# Patient Record
Sex: Male | Born: 1980 | Race: Black or African American | Hispanic: No | Marital: Married | Smoking: Never smoker
Health system: Southern US, Community
[De-identification: ages and names within clinical notes are randomized; demographics above are authoritative.]

## PROBLEM LIST (undated history)

## (undated) ENCOUNTER — Ambulatory Visit: Admission: EM | Payer: Self-pay

## (undated) DIAGNOSIS — E669 Obesity, unspecified: Secondary | ICD-10-CM

## (undated) DIAGNOSIS — T7840XA Allergy, unspecified, initial encounter: Secondary | ICD-10-CM

## (undated) DIAGNOSIS — I10 Essential (primary) hypertension: Secondary | ICD-10-CM

## (undated) DIAGNOSIS — E119 Type 2 diabetes mellitus without complications: Secondary | ICD-10-CM

## (undated) DIAGNOSIS — N451 Epididymitis: Secondary | ICD-10-CM

## (undated) DIAGNOSIS — R7989 Other specified abnormal findings of blood chemistry: Secondary | ICD-10-CM

## (undated) DIAGNOSIS — R945 Abnormal results of liver function studies: Secondary | ICD-10-CM

## (undated) HISTORY — DX: Obesity, unspecified: E66.9

## (undated) HISTORY — DX: Abnormal results of liver function studies: R94.5

## (undated) HISTORY — DX: Other specified abnormal findings of blood chemistry: R79.89

## (undated) HISTORY — DX: Epididymitis: N45.1

## (undated) HISTORY — DX: Allergy, unspecified, initial encounter: T78.40XA

## (undated) HISTORY — DX: Essential (primary) hypertension: I10

## (undated) HISTORY — DX: Type 2 diabetes mellitus without complications: E11.9

---
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
## 2001-02-19 HISTORY — PX: OTHER SURGICAL HISTORY: SHX169

## 2001-03-06 ENCOUNTER — Ambulatory Visit (HOSPITAL_COMMUNITY): Admission: RE | Admit: 2001-03-06 | Discharge: 2001-03-06 | Payer: Self-pay | Admitting: Internal Medicine

## 2001-03-06 ENCOUNTER — Encounter: Payer: Self-pay | Admitting: Internal Medicine

## 2001-03-14 ENCOUNTER — Ambulatory Visit (HOSPITAL_COMMUNITY): Admission: RE | Admit: 2001-03-14 | Discharge: 2001-03-14 | Payer: Self-pay | Admitting: Urology

## 2001-03-14 ENCOUNTER — Encounter: Payer: Self-pay | Admitting: Internal Medicine

## 2001-03-14 ENCOUNTER — Ambulatory Visit (HOSPITAL_COMMUNITY): Admission: RE | Admit: 2001-03-14 | Discharge: 2001-03-14 | Payer: Self-pay | Admitting: Internal Medicine

## 2001-03-14 ENCOUNTER — Encounter: Payer: Self-pay | Admitting: Urology

## 2001-03-18 ENCOUNTER — Ambulatory Visit (HOSPITAL_COMMUNITY): Admission: RE | Admit: 2001-03-18 | Discharge: 2001-03-18 | Payer: Self-pay | Admitting: Urology

## 2001-03-18 ENCOUNTER — Encounter: Payer: Self-pay | Admitting: Urology

## 2001-03-19 ENCOUNTER — Encounter: Payer: Self-pay | Admitting: Urology

## 2001-03-20 ENCOUNTER — Ambulatory Visit (HOSPITAL_COMMUNITY): Admission: RE | Admit: 2001-03-20 | Discharge: 2001-03-20 | Payer: Self-pay | Admitting: Urology

## 2001-03-20 ENCOUNTER — Encounter: Payer: Self-pay | Admitting: Urology

## 2001-03-31 ENCOUNTER — Ambulatory Visit (HOSPITAL_COMMUNITY): Admission: RE | Admit: 2001-03-31 | Discharge: 2001-03-31 | Payer: Self-pay | Admitting: Urology

## 2001-05-07 ENCOUNTER — Encounter: Payer: Self-pay | Admitting: Urology

## 2001-05-07 ENCOUNTER — Ambulatory Visit (HOSPITAL_COMMUNITY): Admission: RE | Admit: 2001-05-07 | Discharge: 2001-05-07 | Payer: Self-pay | Admitting: Urology

## 2002-01-16 ENCOUNTER — Emergency Department (HOSPITAL_COMMUNITY): Admission: EM | Admit: 2002-01-16 | Discharge: 2002-01-16 | Payer: Self-pay | Admitting: Emergency Medicine

## 2002-01-16 ENCOUNTER — Encounter: Payer: Self-pay | Admitting: Emergency Medicine

## 2002-04-03 ENCOUNTER — Encounter: Payer: Self-pay | Admitting: Internal Medicine

## 2002-04-03 ENCOUNTER — Ambulatory Visit (HOSPITAL_COMMUNITY): Admission: RE | Admit: 2002-04-03 | Discharge: 2002-04-03 | Payer: Self-pay | Admitting: Internal Medicine

## 2002-04-06 ENCOUNTER — Emergency Department (HOSPITAL_COMMUNITY): Admission: EM | Admit: 2002-04-06 | Discharge: 2002-04-07 | Payer: Self-pay | Admitting: *Deleted

## 2002-04-14 ENCOUNTER — Encounter (HOSPITAL_COMMUNITY): Admission: RE | Admit: 2002-04-14 | Discharge: 2002-05-14 | Payer: Self-pay | Admitting: Internal Medicine

## 2004-04-08 ENCOUNTER — Emergency Department (HOSPITAL_COMMUNITY): Admission: EM | Admit: 2004-04-08 | Discharge: 2004-04-08 | Payer: Self-pay | Admitting: Emergency Medicine

## 2004-04-10 ENCOUNTER — Ambulatory Visit: Payer: Self-pay | Admitting: Family Medicine

## 2004-05-15 ENCOUNTER — Ambulatory Visit: Payer: Self-pay | Admitting: Family Medicine

## 2004-05-23 ENCOUNTER — Ambulatory Visit (HOSPITAL_COMMUNITY): Admission: RE | Admit: 2004-05-23 | Discharge: 2004-05-23 | Payer: Self-pay | Admitting: Family Medicine

## 2004-07-03 ENCOUNTER — Ambulatory Visit: Payer: Self-pay | Admitting: Family Medicine

## 2004-07-13 ENCOUNTER — Ambulatory Visit: Payer: Self-pay | Admitting: Orthopedic Surgery

## 2004-07-24 ENCOUNTER — Ambulatory Visit (HOSPITAL_COMMUNITY): Admission: RE | Admit: 2004-07-24 | Discharge: 2004-07-24 | Payer: Self-pay | Admitting: Orthopedic Surgery

## 2004-08-02 ENCOUNTER — Ambulatory Visit: Payer: Self-pay | Admitting: Orthopedic Surgery

## 2004-12-19 ENCOUNTER — Ambulatory Visit: Payer: Self-pay | Admitting: Family Medicine

## 2004-12-25 ENCOUNTER — Ambulatory Visit: Payer: Self-pay | Admitting: Family Medicine

## 2007-02-25 ENCOUNTER — Ambulatory Visit: Payer: Self-pay | Admitting: Family Medicine

## 2007-03-11 ENCOUNTER — Encounter: Payer: Self-pay | Admitting: Family Medicine

## 2007-03-11 LAB — CONVERTED CEMR LAB
BUN: 14 mg/dL (ref 6–23)
Basophils Absolute: 0 10*3/uL (ref 0.0–0.1)
Basophils Relative: 1 % (ref 0–1)
Calcium: 9.7 mg/dL (ref 8.4–10.5)
Chloride: 98 meq/L (ref 96–112)
Cholesterol: 222 mg/dL — ABNORMAL HIGH (ref 0–200)
Creatinine, Ser: 1.14 mg/dL (ref 0.40–1.50)
HCT: 51.2 % (ref 39.0–52.0)
HDL: 56 mg/dL (ref >39)
Lymphocytes Relative: 52 % — ABNORMAL HIGH (ref 12–46)
Monocytes Absolute: 0.5 10*3/uL (ref 0.1–1.0)
Neutro Abs: 1.8 10*3/uL (ref 1.7–7.7)
Neutrophils Relative %: 36 % — ABNORMAL LOW (ref 43–77)
Platelets: 277 10*3/uL (ref 150–400)
RDW: 14 % (ref 11.5–15.5)
Sodium: 140 meq/L (ref 135–145)
Total CHOL/HDL Ratio: 4
Triglycerides: 146 mg/dL (ref <150)

## 2007-06-03 ENCOUNTER — Ambulatory Visit: Payer: Self-pay | Admitting: Family Medicine

## 2007-06-13 DIAGNOSIS — I10 Essential (primary) hypertension: Secondary | ICD-10-CM

## 2007-06-13 DIAGNOSIS — E785 Hyperlipidemia, unspecified: Secondary | ICD-10-CM

## 2007-06-13 DIAGNOSIS — E1159 Type 2 diabetes mellitus with other circulatory complications: Secondary | ICD-10-CM | POA: Insufficient documentation

## 2007-06-13 DIAGNOSIS — E1169 Type 2 diabetes mellitus with other specified complication: Secondary | ICD-10-CM | POA: Insufficient documentation

## 2007-06-13 DIAGNOSIS — E669 Obesity, unspecified: Secondary | ICD-10-CM

## 2007-06-13 DIAGNOSIS — I152 Hypertension secondary to endocrine disorders: Secondary | ICD-10-CM | POA: Insufficient documentation

## 2007-10-30 ENCOUNTER — Telehealth: Payer: Self-pay | Admitting: Family Medicine

## 2008-02-24 ENCOUNTER — Ambulatory Visit: Payer: Self-pay | Admitting: Family Medicine

## 2008-02-24 DIAGNOSIS — J029 Acute pharyngitis, unspecified: Secondary | ICD-10-CM

## 2008-04-13 ENCOUNTER — Ambulatory Visit: Payer: Self-pay | Admitting: Family Medicine

## 2008-04-19 ENCOUNTER — Telehealth: Payer: Self-pay | Admitting: Family Medicine

## 2008-04-24 DIAGNOSIS — B369 Superficial mycosis, unspecified: Secondary | ICD-10-CM | POA: Insufficient documentation

## 2008-05-06 ENCOUNTER — Encounter: Payer: Self-pay | Admitting: Family Medicine

## 2008-05-07 LAB — CONVERTED CEMR LAB
Calcium: 9.8 mg/dL (ref 8.4–10.5)
Chloride: 102 meq/L (ref 96–112)
Creatinine, Ser: 1.08 mg/dL (ref 0.40–1.50)
Glucose, Bld: 87 mg/dL (ref 70–99)
HCT: 47.1 % (ref 39.0–52.0)
HDL: 50 mg/dL (ref >39)
Hemoglobin: 15.5 g/dL (ref 13.0–17.0)
Lymphocytes Relative: 40 % (ref 12–46)
Lymphs Abs: 2 10*3/uL (ref 0.7–4.0)
MCV: 84 fL (ref 78.0–100.0)
Monocytes Absolute: 0.5 10*3/uL (ref 0.1–1.0)
Neutro Abs: 2.4 10*3/uL (ref 1.7–7.7)
Platelets: 315 10*3/uL (ref 150–400)
Potassium: 4.4 meq/L (ref 3.5–5.3)
Sodium: 139 meq/L (ref 135–145)

## 2008-05-27 ENCOUNTER — Telehealth: Payer: Self-pay | Admitting: Family Medicine

## 2008-05-28 ENCOUNTER — Encounter: Payer: Self-pay | Admitting: Family Medicine

## 2008-06-09 ENCOUNTER — Telehealth: Payer: Self-pay | Admitting: Family Medicine

## 2008-06-30 ENCOUNTER — Telehealth: Payer: Self-pay | Admitting: Family Medicine

## 2008-08-18 ENCOUNTER — Telehealth: Payer: Self-pay | Admitting: Family Medicine

## 2008-10-13 ENCOUNTER — Telehealth: Payer: Self-pay | Admitting: Family Medicine

## 2008-11-17 ENCOUNTER — Telehealth: Payer: Self-pay | Admitting: Family Medicine

## 2008-11-29 ENCOUNTER — Ambulatory Visit: Payer: Self-pay | Admitting: Family Medicine

## 2008-11-29 LAB — CONVERTED CEMR LAB
BUN: 10 mg/dL (ref 6–23)
Calcium: 9.8 mg/dL (ref 8.4–10.5)
Chloride: 101 meq/L (ref 96–112)
Glucose, Bld: 83 mg/dL (ref 70–99)
HDL: 57 mg/dL (ref >39)
Potassium: 4.1 meq/L (ref 3.5–5.3)
Sodium: 140 meq/L (ref 135–145)
TSH: 3.831 microintl units/mL (ref 0.350–4.500)
VLDL: 18 mg/dL (ref 0–40)

## 2008-11-30 DIAGNOSIS — H6120 Impacted cerumen, unspecified ear: Secondary | ICD-10-CM | POA: Insufficient documentation

## 2008-11-30 DIAGNOSIS — L049 Acute lymphadenitis, unspecified: Secondary | ICD-10-CM | POA: Insufficient documentation

## 2008-11-30 DIAGNOSIS — E663 Overweight: Secondary | ICD-10-CM | POA: Insufficient documentation

## 2008-12-08 ENCOUNTER — Telehealth: Payer: Self-pay | Admitting: Family Medicine

## 2009-01-10 ENCOUNTER — Telehealth: Payer: Self-pay | Admitting: Family Medicine

## 2009-01-31 ENCOUNTER — Telehealth: Payer: Self-pay | Admitting: Family Medicine

## 2009-03-03 ENCOUNTER — Encounter: Payer: Self-pay | Admitting: Family Medicine

## 2009-05-25 ENCOUNTER — Telehealth: Payer: Self-pay | Admitting: Family Medicine

## 2009-06-06 ENCOUNTER — Ambulatory Visit (HOSPITAL_COMMUNITY): Admission: RE | Admit: 2009-06-06 | Discharge: 2009-06-06 | Payer: Self-pay | Admitting: Family Medicine

## 2009-06-06 ENCOUNTER — Ambulatory Visit: Payer: Self-pay | Admitting: Family Medicine

## 2009-06-06 DIAGNOSIS — M79609 Pain in unspecified limb: Secondary | ICD-10-CM

## 2009-08-09 ENCOUNTER — Telehealth: Payer: Self-pay | Admitting: Family Medicine

## 2009-09-01 ENCOUNTER — Telehealth: Payer: Self-pay | Admitting: Family Medicine

## 2009-11-23 LAB — CONVERTED CEMR LAB
BUN: 16 mg/dL (ref 6–23)
Basophils Absolute: 0 10*3/uL (ref 0.0–0.1)
Basophils Relative: 1 % (ref 0–1)
CO2: 29 meq/L (ref 19–32)
Calcium: 10 mg/dL (ref 8.4–10.5)
Chloride: 101 meq/L (ref 96–112)
Creatinine, Ser: 1.13 mg/dL (ref 0.40–1.50)
HCT: 46.6 % (ref 39.0–52.0)
Hemoglobin: 15.8 g/dL (ref 13.0–17.0)
LDL Cholesterol: 100 mg/dL — ABNORMAL HIGH (ref 0–99)
Lymphs Abs: 2.4 10*3/uL (ref 0.7–4.0)
MCV: 83.7 fL (ref 78.0–100.0)
Monocytes Absolute: 0.4 10*3/uL (ref 0.1–1.0)
Monocytes Relative: 8 % (ref 3–12)
Neutrophils Relative %: 35 % — ABNORMAL LOW (ref 43–77)
Platelets: 283 10*3/uL (ref 150–400)
Triglycerides: 80 mg/dL (ref <150)
WBC: 4.4 10*3/uL (ref 4.0–10.5)

## 2009-12-01 ENCOUNTER — Ambulatory Visit: Payer: Self-pay | Admitting: Family Medicine

## 2009-12-02 ENCOUNTER — Encounter: Payer: Self-pay | Admitting: Family Medicine

## 2009-12-05 ENCOUNTER — Encounter: Payer: Self-pay | Admitting: Family Medicine

## 2009-12-07 ENCOUNTER — Telehealth: Payer: Self-pay | Admitting: Physician Assistant

## 2010-03-21 NOTE — Progress Notes (Signed)
Summary: speak with nurse  Phone Note Call from Patient   Summary of Call: would like to speak with nurse [phone] Initial call taken by: Rudene Anda,  August 09, 2009 4:49 PM  Follow-up for Phone Call        returned call, left message Follow-up by: Adella Hare LPN,  August 10, 2009 11:01 AM  Additional Follow-up for Phone Call Additional follow up Details #1::        called patient, left message Additional Follow-up by: Adella Hare LPN,  August 11, 2009 8:26 AM

## 2010-03-21 NOTE — Progress Notes (Signed)
  Phone Note Call from Patient   Summary of Call: just got into town and was wanting to know if he could get [facility] [place]. He stated that he had yellowish drainage and I told him he needed to be seen and he said he was already coming in later this month and wanted something called in. I told him I would check since he didn't want to be seen. 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 and just wanted to get something over the counter so brandi is going to tell him Follow-up by: Lind Guest,  May 25, 2009 2:05 PM   Appended Document:  pls advise robitussin or mucinex  Appended Document:  already advised mucinex per brandi

## 2010-03-21 NOTE — Progress Notes (Signed)
Summary: MEDICINE  Phone Note Call from Patient   Summary of Call: NEEDS SOMETHING FOR CONGESTION THAT WILL NOT EFFECT HIS BLOOD PRESSURE   CALL BACK AT [phone] [facility] Initial call taken by: Lind Guest,  December 07, 2009 3:08 PM  Follow-up for Phone Call        Can try Coricidan HBP.  This is over the counter. Follow-up by: Esperanza Sheets PA,  December 07, 2009 3:31 PM  Additional Follow-up for Phone Call Additional follow up Details #1::        Phone Call Completed Additional Follow-up by: Adella Hare LPN,  December 08, 2009 2:58 PM

## 2010-03-21 NOTE — Assessment & Plan Note (Signed)
Summary: office visit   Vital Signs:  Patient profile:   30 year old male Height:      65 inches Weight:      152.50 pounds BMI:     25.47 O2 Sat:      98 % on Room air Pulse rate:   65 / minute Pulse rhythm:   regular Resp:     16 per minute BP sitting:   126 / 90  (left arm)  Vitals Entered By: Adella Hare LPN (June 06, 2009 2:25 PM)  Nutrition Counseling: Patient's BMI is greater than 25 and therefore counseled on weight management options.  O2 Flow:  Room air CC: follow-up visit Is Patient Diabetic? No Pain Assessment Patient in pain? no        CC:  follow-up visit.  History of Present Illness: Reports  that he has been  doing well. The pt has a very active job, and has m,odified his diet with a 30 pound weight loss in the past 1 yr.  Denies recent fever or chills. Denies sinus pressure, nasal congestion , ear pain or sore throat. Denies chest congestion, or cough productive of sputum. Denies chest pain, palpitations, PND, orthopnea or leg swelling. Denies abdominal pain, nausea, vomitting, diarrhea or constipation. Denies change in bowel movements or bloody stool. Denies dysuria , frequency, incontinence or hesitancy. He c/o bilateral foot pain, feels that glass may be in one foot or some other forign body for some time and wants this checked Denies headaches, vertigo, seizures. Denies depression, anxiety or insomnia. Denies  rash, lesions, or itch.     Current Medications (verified): 1)  Maxzide-25 37.5-25 Mg Tabs (Triamterene-Hctz) .... One Tab By Mouth Qd  Allergies (verified): No Known Drug Allergies  Review of Systems      See HPI Eyes:  Denies blurring, discharge, eye pain, and red eye. MS:  Complains of low back pain; c/o low back spasm occasionally when getting out of the shower, he does lift , bend and stoop on the jop, duratyion 10 to 15 mins.  concerns about a FB in the foot, unsure which but experiences pain on the soles intermittently,  wants it checked . Endo:  Denies cold intolerance, excessive hunger, excessive thirst, excessive urination, heat intolerance, polyuria, and weight change. Heme:  Denies abnormal bruising and bleeding. Allergy:  Complains of seasonal allergies; resoponding to otc med ? allegra or zyrtec pt unsure which.  Physical Exam  General:  Well-developed,well-nourished,in no acute distress; alert,appropriate and cooperative throughout examination HEENT: No facial asymmetry,  EOMI, No sinus tenderness, TM's Clear, oropharynx  pink and moist.   Chest: Clear to auscultation bilaterally.  CVS: S1, S2, No murmurs, No S3.   Abd: Soft, Nontender.  MS: Adequate ROM spine, hips, shoulders and knees. Tender to palpation of soles of feet.No foreign body palpated Ext: No edema.   CNS: CN 2-12 intact, power tone and sensation normal throughout.   Skin: Intact, no visible lesions or rashes.  Psych: Good eye contact, normal affect.  Memory intact, not anxious or depressed appearing.    Impression & Recommendations:  Problem # 1:  FOOT PAIN, BILATERAL (ICD-729.5) Assessment Comment Only  Orders: Radiology other (Radiology Other)  Problem # 2:  OVERWEIGHT (ICD-278.02) Assessment: Improved  Ht: 65 (06/06/2009)   Wt: 152.50 (06/06/2009)   BMI: 25.47 (06/06/2009)  Problem # 3:  HYPERLIPIDEMIA (ICD-272.4) Assessment: Improved  Orders: T-Lipid Profile ([id number])    HDL:57 (11/29/2008), 50 (05/06/2008)  LDL:103 (11/29/2008), 118 (05/06/2008)  Chol:178 (  11/29/2008), 200 (05/06/2008)  Trig:89 (11/29/2008), 159 (05/06/2008)  Problem # 4:  HYPERTENSION (ICD-401.9) Assessment: Deteriorated  His updated medication list for this problem includes:    Maxzide-25 37.5-25 Mg Tabs (Triamterene-hctz) ..... One tab by mouth qd    Maxzide-25 37.5-25 Mg Tabs (Triamterene-hctz) .Marland Kitchen... Take 1 tablet by mouth once a day  Orders: T-Basic Metabolic Panel [phone])  BP today: 126/90 Prior BP: 110/80  (11/29/2008)  Labs Reviewed: K+: 4.1 (11/29/2008) Creat: : 0.98 (11/29/2008)   Chol: 178 (11/29/2008)   HDL: 57 (11/29/2008)   LDL: 103 (11/29/2008)   TG: 89 (11/29/2008)  Complete Medication List: 1)  Maxzide-25 37.5-25 Mg Tabs (Triamterene-hctz) .... One tab by mouth qd 2)  Maxzide-25 37.5-25 Mg Tabs (Triamterene-hctz) .... Take 1 tablet by mouth once a day  Other Orders: T-CBC w/Diff ([id number]) T-TSH [phone]) T-Syphilis Test (RPR) [phone]) T-HIV Antibody  (Reflex) [id number])  Patient Instructions: 1)  CPE in Oct 13 or after 2)  You have done exceptionally well with your weight, keep it up. 3)  Plscommit to at least 30 mins of exercise every day. 4)  Xrays of both feet today., eval pain.R/O Fb 5)  No med changes at this time 6)  BMP prior to visit, ICD-9: 7)  Lipid Panel prior to visit, ICD-9:  fasting Oct 12 or after 8)  TSH prior to visit, ICD-9: 9)  CBC w/ Diff prior to visit, ICD-9: 10)  RPR and HIV test Prescriptions: MAXZIDE-25 37.5-25 MG TABS (TRIAMTERENE-HCTZ) Take 1 tablet by mouth once a day  #90 x 2   Entered and Authorized by:   [name] MD   Signed by:   [name] MD on 06/06/2009   Method used:   Print then Give to Patient   RxID:   [id number]

## 2010-03-21 NOTE — Assessment & Plan Note (Signed)
Summary: office visit   Vital Signs:  Patient profile:   30 year old male Height:      65 inches Weight:      150.25 pounds BMI:     25.09 O2 Sat:      99 % Pulse rate:   109 / minute Pulse rhythm:   regular Resp:     16 per minute BP sitting:   110 / 82  (left arm) Cuff size:   regular  Vitals Entered By: Everitt Amber LPN (December 01, 2009 4:30 PM)  Nutrition Counseling: Patient's BMI is greater than 25 and therefore counseled on weight management options. CC: CPE    CC:  CPE .  History of Present Illness: Reports  that he has been  doing well. Denies recent fever or chills. Denies sinus pressure, nasal congestion , ear pain or sore throat. Denies chest congestion, or cough productive of sputum. Denies chest pain, palpitations, PND, orthopnea or leg swelling. Denies abdominal pain, nausea, vomitting, diarrhea or constipation. Denies change in bowel movements or bloody stool. Denies dysuria , frequency, incontinence or hesitancy. Denies  joint pain, swelling, or reduced mobility. Denies headaches, vertigo, seizures. Denies depression, anxiety or insomnia.  pt here to review lab dATA AS WELL AS FOR ROUTINE F/U. hE CONTINUES TO BE CAREFUL WITH HIS DIET, AND IS EXTREMELY ACTIVE ON THE JOB, HE HAS LOST A FEW MORE POUNDS.      Allergies (verified): No Known Drug Allergies  Review of Systems      See HPI General:  Denies fatigue, sleep disorder, and weakness. Eyes:  Denies blurring and discharge. Derm:  Complains of itching and rash; puritic rash on posterior right neck x 3 weks. Endo:  Denies cold intolerance, excessive thirst, excessive urination, and heat intolerance. Heme:  Denies abnormal bruising and bleeding. Allergy:  Complains of seasonal allergies; denies hives or rash and itching eyes.  Physical Exam  General:  Well-developed,well-nourished,in no acute distress; alert,appropriate and cooperative throughout examination HEENT: No facial asymmetry,  EOMI,  No sinus tenderness, TM's Clear, oropharynx  pink and moist.   Chest: Clear to auscultation bilaterally.  CVS: S1, S2, No murmurs, No S3.   Abd: Soft, Nontender.  MS: Adequate ROM spine, hips, shoulders and knees.  Ext: No edema.   CNS: CN 2-12 intact, power tone and sensation normal throughout.   Skin: Intact,fungal rash on posterior right neck  Psych: Good eye contact, normal affect.  Memory intact, not anxious or depressed appearing.    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 Apply twice daily as needed to rash  Problem # 3:  HYPERTENSION (ICD-401.9) Assessment: Improved  The following medications were removed from the medication list:    Maxzide-25 37.5-25 Mg Tabs (Triamterene-hctz) ..... One tab by mouth qd His updated medication list for this problem includes:    Maxzide-25 37.5-25 Mg Tabs (Triamterene-hctz) .Marland Kitchen... Take 1 tablet by mouth once a day  BP today: 110/82 Prior BP: 126/90 (06/06/2009)  Labs Reviewed: K+: 4.4 (11/23/2009) Creat: : 1.13 (11/23/2009)   Chol: 177 (11/23/2009)   HDL: 61 (11/23/2009)   LDL: 100 (11/23/2009)   TG: 80 (11/23/2009)  Complete Medication List: 1)  Maxzide-25 37.5-25 Mg Tabs (Triamterene-hctz) .... Take 1 tablet by mouth once a day 2)  Clotrimazole-betamethasone 1-0.05 % Crea (Clotrimazole-betamethasone) .... Apply twice daily as needed to rash  Other Orders: Influenza Vaccine NON MCR ([id number])  Patient Instructions: 1)  CPE in 5 months and 3 weeks. 2)  Your Blood pressure is great, and  labs are excerllent. 3)  Weight is healthy. 4)  Labs have improved. 5)  Keep active.Eat healthily 6)  New med for rash . use as directed 7)  Flu vaccine today Prescriptions: CLOTRIMAZOLE-BETAMETHASONE 1-0.05 % CREA (CLOTRIMAZOLE-BETAMETHASONE) apply twice daily as needed to rash  #45 gm x 1   Entered and Authorized by:   [name] MD   Signed by:   [name] MD on 12/01/2009   Method used:   Electronically to        [facility] [phone]* (retail)       [address]       [place], [place]  [address]       Ph: [phone]       Fax: [phone]   RxID:   [id number]     Immunizations Administered:  Influenza Vaccine:    Vaccine Type: Fluvax Non-MCR    Site: right deltoid    Mfr: novartis    Dose: 0.5 ml    Route: IM    Given by: Mauricia Area CMA    Exp. Date: 06/2010    Lot #: [id number]    VIS given: 09/13/09 version given December 01, 2009.

## 2010-03-21 NOTE — Letter (Signed)
Summary: med review sheet  med review sheet   Imported By: Rudene Anda 12/02/2009 15:41:49  _____________________________________________________________________  External Attachment:    Type:   Image     Comment:   External Document

## 2010-03-21 NOTE — Progress Notes (Signed)
Summary: nat bites  Phone Note Call from Patient   Summary of Call: wants something  called in for some nats that have bit him on his back    [facility] [address] [facility] [place] [facility] him [facility] at [phone] to let him know Initial call taken by: Lind Guest,  September 01, 2009 8:55 AM  Follow-up for Phone Call        advised him he would have to come in to see [facility] but he cannot come in because he has worked third shift and is in [place]. Follow-up by: Everitt Amber LPN,  September 01, 2009 10:01 AM  Additional Follow-up for Phone Call Additional follow up Details #1::        Per Dr. Lodema Hong, patient is to use triple antibiotic ointment and if areas become red or inflammed looking then he needs OV. He has tried that but he knows that he is allergic to the bites and he has inflammed areas from scratching.Has approx. 30-40 bites on his neck. States he will go to the 2 minute clinic at [facility]. I told him that he could do that because before anything prescription is called in the doc would have to see the areas Additional Follow-up by: Everitt Amber LPN,  September 01, 2009 10:07 AM

## 2010-03-21 NOTE — Miscellaneous (Signed)
Summary: refill  Clinical Lists Changes  Medications: Rx of MAXZIDE-25 37.5-25 MG TABS (TRIAMTERENE-HCTZ) one tab by mouth qd;  #90 Tablet x 3;  Signed;  Entered by: Everitt Amber;  Authorized by: [name] MD;  Method used: [facility] [address] [id number] [address], [place], [place]  [address], Ph: [phone] or [phone], Fax: [phone]    Prescriptions: MAXZIDE-25 37.5-25 MG TABS (TRIAMTERENE-HCTZ) one tab by mouth qd  #90 Tablet x 3   Entered by:   Everitt Amber   Authorized by:   [name] MD   Signed by:   Everitt Amber on 03/03/2009   Method used:   Electronically to        [facility] ([facility]       [address]       [place], [place]  [address]       Ph: [phone] or [phone]       Fax: [phone]   RxID:   [id number]

## 2010-03-21 NOTE — Letter (Signed)
Summary: med review shee  med review shee   Imported By: Rudene Anda 12/05/2009 16:19:56  _____________________________________________________________________  External Attachment:    Type:   Image     Comment:   External Document

## 2010-03-22 ENCOUNTER — Telehealth: Payer: Self-pay | Admitting: Family Medicine

## 2010-04-06 NOTE — Progress Notes (Signed)
Summary: back pain  Phone Note Call from Patient   Summary of Call: needs a rx for back pain. [phone] Initial call taken by: Rudene Anda,  March 22, 2010 4:37 PM  Follow-up for Phone Call        needs appt for this, please offer first available ov Follow-up by: Adella Hare LPN,  March 22, 2010 4:49 PM  Additional Follow-up for Phone Call Additional follow up Details #1::        next monday morning I hear I am wide open I will be here Additional Follow-up by: [name] MD,  March 24, 2010 12:27 PM    Additional Follow-up for Phone Call Additional follow up Details #2::    called patient, left message Follow-up by: Adella Hare LPN,  March 28, 2010 11:15 AM  Additional Follow-up for Phone Call Additional follow up Details #3:: Details for Additional Follow-up Action Taken: returned call, no answer Additional Follow-up by: Adella Hare LPN,  March 29, 2010 8:58 AM

## 2010-05-22 ENCOUNTER — Encounter: Payer: Self-pay | Admitting: Family Medicine

## 2010-05-23 ENCOUNTER — Encounter: Payer: Self-pay | Admitting: Family Medicine

## 2010-05-24 ENCOUNTER — Encounter: Payer: Self-pay | Admitting: Family Medicine

## 2010-06-28 ENCOUNTER — Telehealth: Payer: Self-pay | Admitting: Family Medicine

## 2010-06-28 ENCOUNTER — Other Ambulatory Visit: Payer: Self-pay | Admitting: Family Medicine

## 2010-06-28 NOTE — Telephone Encounter (Signed)
This has already been done.

## 2010-07-07 NOTE — Op Note (Signed)
[facility]  Patient:    Devon Macdonald, Devon Macdonald Visit Number: [id number] MRN: [id number]          Service Type: DSU Location: DAY Attending Physician:  Dennie Maizes Dictated by:   Dennie Maizes, M.D. Proc. Date: 03/20/01 Admit Date:  03/20/2001   CC:         Elliot Cousin, M.D.   Operative Report  PREOPERATIVE DIAGNOSIS:  Right renal colic, right distal ureteral calculus with obstruction.  POSTOPERATIVE DIAGNOSIS:  Right renal colic, right distal ureteral calculus with obstruction.  OPERATIVE PROCEDURE:  Cystoscopy, right retrograde pyelogram, ureteroscopy and right ureteral stent placement.  ANESTHESIA:  General.  SURGEON:  Dennie Maizes, M.D.  COMPLICATIONS:  None.  INDICATIONS FOR THE PROCEDURE:  This 30 year old male was evaluated for right renal colic.  X-rays revealed a 7 x 4-mm-size stone at the level of the distal ureter near the pelvic brim.  Patient was unable to pass the stone.  He is taken to the OR today for cystoscopy, right retrograde pyelogram, ureteroscopy, stone extraction and right ureteral stent placement.  DESCRIPTION OF THE PROCEDURE:  General anesthesia was induced and the patient was placed on the cystoscopy table in the dorsal lithotomy position.  The lower abdomen and genitalia were prepped and draped in a sterile fashion. Cystoscopy was done with the 25-French scope.  The urethra, prostate and bladder were normal.  The trigone, ureteral orifices and bladder mucosa were unremarkable.  A 5-French Burch catheter was then placed in the right ureteral orifice.  About 7 cc of Renografin 60 were injected into the collecting system and a retrograde pyelogram was done with C-arm fluoroscopy.  The distal ureter was normal.  There was a filling defect about 7 mm in size in the distal ureter at the level of the pelvic brim.  The ureter above the filling defect was found to be moderately dilated.  A 6-French open-ended  catheter was then placed in the right distal ureter.  A 0.038-inch Benson guidewire with a flexible tip was then advanced into the renal pelvis with some difficulty. The distal ureter was then dilated using 18-French balloon dilating catheter. The guidewire was left in the ureter and the balloon dilating catheter was removed.  A 9-French rigid ureteroscope was then inserted into the distal ureter.  There was an area of narrowing, fibrosis and edema just below the level of the stone.  It was not possible to pass the scope through this area. The stone was not visualized.  Stone extraction could not be done.  The ureteroscope was then removed.  Through a cystoscope, a 6-French 26-cm-size stent was then inserted into the right collecting system.  There was some difficulty in inserting the stent through the site of narrowing in the ureter.  The instruments were then removed.  The patient was transferred to the PACU in a satisfactory condition.  I plan to treat the stone with lithotripsy as an outpatient at a later date; this will be explained to the patient and his family. Dictated by:   Dennie Maizes, M.D. Attending Physician:  Dennie Maizes DD:  03/20/01 TD:  03/21/01 Job: [id number]

## 2010-07-07 NOTE — Op Note (Signed)
[facility]  Patient:    KALUP, JAQUITH Visit Number: [id number] MRN: [id number]          Service Type: DSU Location: DAY Attending Physician:  Dennie Maizes Dictated by:   Dennie Maizes, M.D. Proc. Date: 03/31/01 Admit Date:  03/31/2001   CC:         Elliot Cousin, M.D.   Operative Report  PREOPERATIVE DIAGNOSIS:  Right distal ureteral calculus, status post ureteral stent placement and lithotripsy.  POSTOPERATIVE DIAGNOSIS:  Right distal ureteral calculus, status post ureteral stent placement and lithotripsy.  OPERATIVE PROCEDURE:  Cystoscopy and removal of right ureteral stent.  ANESTHESIA:  General.  SURGEON:  Dennie Maizes, M.D.  COMPLICATIONS:  None.  INDICATIONS FOR THE PROCEDURE:  This 31 year old male had right renal colic. He had a 7 x 5-mm-size right distal ureteral calculus with obstruction. Cystoscopy, right retrograde pyelogram, right ureteral stent placement were done.  The stone has been treated with lithotripsy.  The patient is brought to the OR today for cystoscopy and removal of the right ureteral stent.  DESCRIPTION OF THE PROCEDURE:  General anesthesia was induced and the patient was placed on the OR table in the dorsal lithotomy position.  The genitalia were prepped and draped in a sterile fashion.  Cystoscopy was done with the 25-French scope.  The urethra was normal.  Bladder was unremarkable.  The lower end of the stent was then held with the grasping forceps and removed without any difficulty.  Patient was transferred to the PACU in a satisfactory condition. Dictated by:   Dennie Maizes, M.D. Attending Physician:  Dennie Maizes DD:  03/31/01 TD:  03/31/01 Job: [id number]

## 2010-07-07 NOTE — H&P (Signed)
[facility]  Patient:    Devon Macdonald, Devon Macdonald Visit Number: [id number] MRN: [id number]          Service Type: DSU Location: DAY Attending Physician:  Dennie Maizes Dictated by:   Dennie Maizes, M.D. Admit Date:  03/31/2001   CC:         Elliot Cousin, M.D.   History and Physical  CHIEF COMPLAINT:  Right distal ureteral calculus, right flank pain.  HISTORY OF PRESENT ILLNESS:  This 30 year old male was evaluated for right renal colic.  X-rays revealed small right renal calculi without obstruction. There is a 7 x 5 mm size right distal ureteral calculus to the lower left _______.  Cystoscopy and right retrograde pyelogram and ureteroscopy were done on March 20, 2001.  The patient had fibrosis, edema, and narrowing of the distal ureter just distal to the stone.  The stone could not be seen and retrieved.  An ureteral stent was placed at that time.  The patient has subsequently undergone ESWL of the right ureteral calculus.  Followup x-rays revealed that the stone had been fragmented well.  The patient is doing well at present.  He is brought to the day hospital for cystoscopy and removal of the right ureteral stent.  PAST MEDICAL HISTORY:  No medical illnesses.  Status post ORIF of right femoral fracture.  MEDICATIONS:  Bactrim and Percocet.  ALLERGIES:   No known drug allergies.  PHYSICAL EXAMINATION:  HEENT:  Normal.  NECK:  No masses.  LUNGS:  Clear to auscultation.  HEART:  Regular rate and rhythm with no murmur.  ABDOMEN:  Soft, no palpable flank mass.  No CVA tenderness.  Bladder not palpable.  GU:  Penis and testes are normal.  IMPRESSION:  Right distal ureteral calculus, status post right ureteral stent placement, status post lithotripsy.  PLAN:  Cystoscopy and removal of the right ureteral stent under anesthesia in day hospital.  I will discuss with the patient regarding the diagnosis, operative details, alternate  treatments, outcome, possible risks, and complications, and he has agreed for the procedure to be done. Dictated by:   Dennie Maizes, M.D. Attending Physician:  Dennie Maizes DD:  03/31/01 TD:  03/31/01 Job: [id number]

## 2010-07-24 ENCOUNTER — Encounter: Payer: Self-pay | Admitting: Family Medicine

## 2010-07-25 ENCOUNTER — Ambulatory Visit (INDEPENDENT_AMBULATORY_CARE_PROVIDER_SITE_OTHER): Payer: Managed Care, Other (non HMO) | Admitting: Family Medicine

## 2010-07-25 ENCOUNTER — Encounter: Payer: Self-pay | Admitting: Family Medicine

## 2010-07-25 DIAGNOSIS — I10 Essential (primary) hypertension: Secondary | ICD-10-CM

## 2010-07-25 DIAGNOSIS — B369 Superficial mycosis, unspecified: Secondary | ICD-10-CM

## 2010-07-25 DIAGNOSIS — E785 Hyperlipidemia, unspecified: Secondary | ICD-10-CM

## 2010-07-25 DIAGNOSIS — H6120 Impacted cerumen, unspecified ear: Secondary | ICD-10-CM

## 2010-07-25 MED ORDER — TRIAMTERENE-HCTZ 37.5-25 MG PO TABS: ORAL_TABLET | ORAL | Status: DC

## 2010-07-25 NOTE — Progress Notes (Signed)
Bilateral ear irrigation. Successfully removed from both sides with no signs of irritation. Rechecked by Dr. Lodema Hong

## 2010-07-25 NOTE — Patient Instructions (Signed)
F/u in 2 months.  Dose increase on maxzide to ONE and a half tablets once daily, bP today is 130/100  It is important that you exercise regularly at least 30 minutes 5 times a week. If you develop chest pain, have severe difficulty breathing, or feel very tired, stop exercising immediately and seek medical attention  A healthy diet is rich in fruit, vegetables and whole grains. Poultry fish, nuts and beans are a healthy choice for protein rather then red meat. A low sodium diet and drinking 64 ounces of water daily is generally recommended. Oils and sweet should be limited. Carbohydrates especially for those who are diabetic or overweight, should be limited to 34-45 gram per meal. It is important to eat on a regular schedule, at least 3 times daily. Snacks should be primarily fruits, vegetables or nuts.   Fasting lipid and chem 7 in 2 months, before next visit  Ear irrigation today

## 2010-08-02 NOTE — Assessment & Plan Note (Signed)
Mild flare affecting back med prescribed

## 2010-08-02 NOTE — Assessment & Plan Note (Signed)
Medication compliance addressed. Commitment to regular exercise and healthy  food choices, with portion control discussed. DASH diet and low fat diet discussed and literature offered. Changes in medication made at this visit.  

## 2010-08-02 NOTE — Assessment & Plan Note (Signed)
Ear irrigate succesfully, no complications

## 2010-08-02 NOTE — Progress Notes (Signed)
  Subjective:    Patient ID: Devon Macdonald, male    DOB: Sep 28, 1980, 30 y.o.   MRN: [id number]  HPI The PT is here for annual exam  and re-evaluation of chronic medical conditions, medication management and review of any  recent lab and radiology data.  Preventive health is updated, specifically   Immunization.   Questions or concerns regarding consultations or procedures which the PT has had in the interim are  addressed. The PT denies any adverse reactions to current medications since the last visit.  C/o left ear discomfort x 1 week. C/o rash and itch on back x 3 days      Review of Systems Denies recent fever or chills. Denies sinus pressure, nasal congestion,  or sore throat. Denies chest congestion, productive cough or wheezing. Denies chest pains, palpitations, paroxysmal nocturnal dyspnea, orthopnea and leg swelling Denies abdominal pain, nausea, vomiting,diarrhea or constipation.  Denies rectal bleeding or change in bowel movement. Denies dysuria, frequency, hesitancy or incontinence. Denies joint pain, swelling and limitation in mobility. Denies headaches, seizure, numbness, or tingling. Denies depression, anxiety or insomnia.        Objective:   Physical Exam Pleasant well nourished male, alert and oriented x 3, in no cardio-pulmonary distress. Afebrile. HEENT No facial trauma or asymetry.   EOMI, PERTL, fundoscopic exam is negative for hemorhages or exudates. External ears normal,left cerumen impaction  Oropharynx moist, no exudate, good dentition. Neck: supple, no adenopathy,JVD or thyromegaly.No bruits.  Chest: Clear to ascultation bilaterally.No crackles or wheezes. Non tender to palpation  Breast: No asymetry,no masses. No nipple discharge or inversion. No axillary or supraclavicular adenopathy  Cardiovascular system; Heart sounds normal,  S1 and  S2 ,no S3.  No murmur, or thrill. Apical beat not displaced Peripheral pulses  normal.  Abdomen: Soft, non tender, no organomegaly or masses. No bruits. Bowel sounds normal. No guarding, tenderness or rebound.   GU: No penile lesion or discharge. No testicular mass.  Musculoskeletal exam: Full ROM of spine, hips , shoulders and knees. No deformity ,swelling or crepitus noted. No muscle wasting or atrophy.   Neurologic: Cranial nerves 2 to 12 intact. Power, tone ,sensation and reflexes normal throughout. No disturbance in gait. No tremor.  Skin: Intact, tinea versicolor on back Pigmentation normal throughout  Psych; Normal mood and affect. Judgement and concentration normal         Assessment & Plan:

## 2010-10-05 LAB — BASIC METABOLIC PANEL
BUN: 14 mg/dL (ref 6–23)
CO2: 29 mEq/L (ref 19–32)
Chloride: 98 mEq/L (ref 96–112)
Creat: 1.08 mg/dL (ref 0.50–1.35)
Glucose, Bld: 79 mg/dL (ref 70–99)
Potassium: 4.7 mEq/L (ref 3.5–5.3)
Sodium: 138 mEq/L (ref 135–145)

## 2010-10-05 LAB — LIPID PANEL
LDL Cholesterol: 133 mg/dL — ABNORMAL HIGH (ref 0–99)
Triglycerides: 114 mg/dL (ref <150)
VLDL: 23 mg/dL (ref 0–40)

## 2010-10-05 NOTE — Progress Notes (Signed)
Patient aware.

## 2010-10-16 ENCOUNTER — Encounter: Payer: Self-pay | Admitting: Family Medicine

## 2010-10-17 ENCOUNTER — Ambulatory Visit (INDEPENDENT_AMBULATORY_CARE_PROVIDER_SITE_OTHER): Payer: Managed Care, Other (non HMO) | Admitting: Family Medicine

## 2010-10-17 ENCOUNTER — Encounter: Payer: Self-pay | Admitting: Family Medicine

## 2010-10-17 VITALS — BP 140/82 | HR 79 | Resp 16 | Ht 65.0 in | Wt 169.1 lb

## 2010-10-17 DIAGNOSIS — F439 Reaction to severe stress, unspecified: Secondary | ICD-10-CM

## 2010-10-17 DIAGNOSIS — F329 Major depressive disorder, single episode, unspecified: Secondary | ICD-10-CM

## 2010-10-17 DIAGNOSIS — E785 Hyperlipidemia, unspecified: Secondary | ICD-10-CM

## 2010-10-17 DIAGNOSIS — M549 Dorsalgia, unspecified: Secondary | ICD-10-CM | POA: Insufficient documentation

## 2010-10-17 DIAGNOSIS — I10 Essential (primary) hypertension: Secondary | ICD-10-CM

## 2010-10-17 DIAGNOSIS — Z733 Stress, not elsewhere classified: Secondary | ICD-10-CM

## 2010-10-17 MED ORDER — METHYLPREDNISOLONE ACETATE 80 MG/ML IJ SUSP
80.0000 mg | Freq: Once | INTRAMUSCULAR | Status: AC
  Administered 2010-10-17: 80 mg via INTRAMUSCULAR

## 2010-10-17 MED ORDER — CYCLOBENZAPRINE HCL 10 MG PO TABS: ORAL_TABLET | ORAL | Status: DC

## 2010-10-17 MED ORDER — PREDNISONE (PAK) 5 MG PO TABS: 5.0000 mg | ORAL_TABLET | ORAL | Status: DC

## 2010-10-17 MED ORDER — KETOROLAC TROMETHAMINE 60 MG/2ML IM SOLN
60.0000 mg | Freq: Once | INTRAMUSCULAR | Status: AC
  Administered 2010-10-17: 60 mg via INTRAMUSCULAR

## 2010-10-17 MED ORDER — IBUPROFEN 800 MG PO TABS: 800.0000 mg | ORAL_TABLET | Freq: Three times a day (TID) | ORAL | Status: DC | PRN

## 2010-10-17 NOTE — Assessment & Plan Note (Signed)
Several year h/o mid back pain with recurrent flares, anti-inflammatories administered and prescribed, x ray ordered also

## 2010-10-17 NOTE — Assessment & Plan Note (Signed)
Deteriorated, low fat diet discussed and encouraged 

## 2010-10-17 NOTE — Assessment & Plan Note (Signed)
Stressed and overwhelmed with life circumstances, depressed and tearful, not suicidal or homicidal, therapy suggested , pt will think about this

## 2010-10-17 NOTE — Progress Notes (Signed)
  Subjective:    Patient ID: Devon Macdonald, male    DOB: 10/18/1980, 30 y.o.   MRN: [id number]  HPI Pt in today, tearful, angry, stressed, depressed and overwhelmed with life circumstances. At 48 he feels responsible for his 68 yr old sister, her 3 children, his mother, since father is alcoholic , himself and his son. He states he has noo pleasure in life, no time for himself, and his health is deteriorating as a result. He has gained weight, his blood pressure is still elevated, he is not exercising, and he is stressed. He is not suicidal or homicidal, but wants someone to tell him how to "fix " his life. C/o several year h/o mid and low back pain, non radiating, remembers straining it playing with his nephew. Reports increased flares of painand spasm   Review of Systems See HPI Denies recent fever or chills. Denies sinus pressure, nasal congestion, ear pain or sore throat. Denies chest congestion, productive cough or wheezing. Denies chest pains, palpitations and leg swelling Denies abdominal pain, nausea, vomiting,diarrhea or constipation.   Denies dysuria, frequency, hesitancy or incontinence. . Denies skin break down or rash.        Objective:   Physical Exam Patient alert and oriented and in no cardiopulmonary distress.  HEENT: No facial asymmetry, EOMI, no sinus tenderness,  oropharynx pink and moist.  Neck supple no adenopathy.  Chest: Clear to auscultation bilaterally.  CVS: S1, S2 no murmurs, no S3.  ABD: Soft non tender. Bowel sounds normal.  Ext: No edema  MS: Adequate ROM spine, shoulders, hips and knees.  Skin: Intact, no ulcerations or rash noted.  Psych: Good eye contact, tearful , anxious , angry and depressed, not suicidal, homicidal or hallucinating  CNS: CN 2-12 intact, power, tone and sensation normal throughout.        Assessment & Plan:

## 2010-10-17 NOTE — Patient Instructions (Signed)
F/u in 6 weeks.  Pls start running or jogging every day with music , this will help you to deal with stress and you should enjoy this and it will also help your blood pressure.  I am not changing your medication dose .  Increase fruit and vegetables and decrease salt.  Pls try and lose 4 pounds, and find something to smile about every day.  pls consider therapy for yourself, and be more open about how you feel, which is actuallty coming from a really good and caring heart  Medication and injections for mid back pain which you have had for years and muscle relaxant only if you have severe spasm. X ray of back also

## 2010-10-17 NOTE — Assessment & Plan Note (Signed)
Uncontrolled , no med change at this time

## 2010-10-18 ENCOUNTER — Ambulatory Visit (HOSPITAL_COMMUNITY)
Admission: RE | Admit: 2010-10-18 | Discharge: 2010-10-18 | Disposition: A | Discharge status: Home / Self Care | Payer: Managed Care, Other (non HMO) | Source: Ambulatory Visit | Attending: Family Medicine | Admitting: Family Medicine

## 2010-10-18 ENCOUNTER — Other Ambulatory Visit: Payer: Self-pay

## 2010-10-18 DIAGNOSIS — M546 Pain in thoracic spine: Secondary | ICD-10-CM | POA: Insufficient documentation

## 2010-10-18 DIAGNOSIS — M545 Low back pain, unspecified: Secondary | ICD-10-CM | POA: Insufficient documentation

## 2010-10-18 DIAGNOSIS — M549 Dorsalgia, unspecified: Secondary | ICD-10-CM

## 2010-10-18 NOTE — Progress Notes (Signed)
Thoracolumbar was ordered and that is for scoliosis. Reordered per Dr Lodema Hong a thoracic

## 2010-10-24 ENCOUNTER — Telehealth: Payer: Self-pay | Admitting: *Deleted

## 2010-10-24 MED ORDER — PENICILLIN V POTASSIUM 500 MG PO TABS: 500.0000 mg | ORAL_TABLET | Freq: Three times a day (TID) | ORAL | Status: AC

## 2010-10-24 NOTE — Telephone Encounter (Signed)
pls advise and erx penicillin 500mg  one 3 times daily #30 for sinusitis

## 2010-10-24 NOTE — Telephone Encounter (Signed)
MED SENT, CALLED PATIENT, LEFT DETAILED VOICEMAIL

## 2010-11-30 ENCOUNTER — Encounter: Payer: Self-pay | Admitting: Family Medicine

## 2011-01-03 ENCOUNTER — Other Ambulatory Visit: Payer: Self-pay | Admitting: Family Medicine

## 2011-01-05 ENCOUNTER — Telehealth: Payer: Self-pay | Admitting: Family Medicine

## 2011-01-05 MED ORDER — TRIAMTERENE-HCTZ 37.5-25 MG PO TABS: ORAL_TABLET | ORAL | Status: DC

## 2011-01-05 NOTE — Telephone Encounter (Signed)
Sent in again for 90 day supply

## 2011-01-09 ENCOUNTER — Encounter: Payer: Self-pay | Admitting: Family Medicine

## 2011-01-09 ENCOUNTER — Telehealth: Payer: Self-pay | Admitting: Family Medicine

## 2011-01-09 ENCOUNTER — Other Ambulatory Visit: Payer: Self-pay | Admitting: Family Medicine

## 2011-01-09 MED ORDER — PROMETHAZINE-DM 6.25-15 MG/5ML PO SYRP: 5.0000 mL | ORAL_SOLUTION | Freq: Four times a day (QID) | ORAL | Status: AC | PRN

## 2011-01-09 NOTE — Telephone Encounter (Signed)
Has been coughing all day, has appt in the am but needs some cough medicine today because he has to work [employer]. Please send to [facility]

## 2011-01-09 NOTE — Telephone Encounter (Signed)
Medication has been sent in , message left that med has been sent in

## 2011-01-10 ENCOUNTER — Ambulatory Visit (INDEPENDENT_AMBULATORY_CARE_PROVIDER_SITE_OTHER): Payer: Managed Care, Other (non HMO) | Admitting: Family Medicine

## 2011-01-10 ENCOUNTER — Encounter: Payer: Self-pay | Admitting: Family Medicine

## 2011-01-10 ENCOUNTER — Other Ambulatory Visit: Payer: Self-pay | Admitting: Family Medicine

## 2011-01-10 ENCOUNTER — Ambulatory Visit (HOSPITAL_COMMUNITY)
Admission: RE | Admit: 2011-01-10 | Discharge: 2011-01-10 | Disposition: A | Discharge status: Home / Self Care | Payer: Managed Care, Other (non HMO) | Source: Ambulatory Visit | Attending: Family Medicine | Admitting: Family Medicine

## 2011-01-10 VITALS — BP 130/72 | HR 103 | Temp 99.0°F | Resp 18 | Ht 65.0 in | Wt 162.1 lb

## 2011-01-10 DIAGNOSIS — J209 Acute bronchitis, unspecified: Secondary | ICD-10-CM

## 2011-01-10 DIAGNOSIS — R509 Fever, unspecified: Secondary | ICD-10-CM | POA: Insufficient documentation

## 2011-01-10 DIAGNOSIS — F329 Major depressive disorder, single episode, unspecified: Secondary | ICD-10-CM

## 2011-01-10 DIAGNOSIS — R5383 Other fatigue: Secondary | ICD-10-CM

## 2011-01-10 DIAGNOSIS — R5381 Other malaise: Secondary | ICD-10-CM

## 2011-01-10 DIAGNOSIS — R059 Cough, unspecified: Secondary | ICD-10-CM | POA: Insufficient documentation

## 2011-01-10 DIAGNOSIS — R05 Cough: Secondary | ICD-10-CM | POA: Insufficient documentation

## 2011-01-10 DIAGNOSIS — J0100 Acute maxillary sinusitis, unspecified: Secondary | ICD-10-CM

## 2011-01-10 DIAGNOSIS — Z113 Encounter for screening for infections with a predominantly sexual mode of transmission: Secondary | ICD-10-CM

## 2011-01-10 DIAGNOSIS — I10 Essential (primary) hypertension: Secondary | ICD-10-CM

## 2011-01-10 LAB — CBC WITH DIFFERENTIAL/PLATELET
Eosinophils Absolute: 0.1 10*3/uL (ref 0.0–0.7)
HCT: 48.1 % (ref 39.0–52.0)
Hemoglobin: 16 g/dL (ref 13.0–17.0)
Lymphocytes Relative: 32 % (ref 12–46)
Lymphs Abs: 2.7 10*3/uL (ref 0.7–4.0)
MCHC: 33.3 g/dL (ref 30.0–36.0)
Monocytes Absolute: 0.8 10*3/uL (ref 0.1–1.0)
Monocytes Relative: 10 % (ref 3–12)
RBC: 5.54 MIL/uL (ref 4.22–5.81)
WBC: 8.4 10*3/uL (ref 4.0–10.5)

## 2011-01-10 LAB — BASIC METABOLIC PANEL
BUN: 16 mg/dL (ref 6–23)
Calcium: 10.2 mg/dL (ref 8.4–10.5)
Glucose, Bld: 83 mg/dL (ref 70–99)
Potassium: 4.6 mEq/L (ref 3.5–5.3)

## 2011-01-10 LAB — HIV ANTIBODY (ROUTINE TESTING W REFLEX): HIV: NONREACTIVE

## 2011-01-10 MED ORDER — BENZONATATE 100 MG PO CAPS
100.0000 mg | ORAL_CAPSULE | Freq: Four times a day (QID) | ORAL | Status: DC | PRN
Start: 2011-01-10 — End: 2011-04-04

## 2011-01-10 MED ORDER — CEFTRIAXONE SODIUM 500 MG IJ SOLR
500.0000 mg | Freq: Once | INTRAMUSCULAR | Status: AC
  Administered 2011-01-10: 500 mg via INTRAMUSCULAR

## 2011-01-10 MED ORDER — SULFAMETHOXAZOLE-TRIMETHOPRIM 800-160 MG PO TABS: 1.0000 | ORAL_TABLET | Freq: Two times a day (BID) | ORAL | Status: AC

## 2011-01-10 NOTE — Assessment & Plan Note (Signed)
Controlled, no change in medication  

## 2011-01-10 NOTE — Assessment & Plan Note (Signed)
Improved, and on no medication 

## 2011-01-10 NOTE — Progress Notes (Signed)
  Subjective:    Patient ID: Devon Macdonald, male    DOB: 1980/05/13, 30 y.o.   MRN: [id number]  HPI 1 week h/o right maxillary pressure  With green right nasal drainage , cough productive of green sputum, chills and fever for 1 week intermittent. Reports improvement in symptoms of depression, has been  On no medication, states he is currently in a good relationship, which he finds has helped a lot in this regard. Wants to be checked fo sTI   Review of Systems See HPI Denies abdominal pain, nausea, vomiting,diarrhea or constipation.   Denies dysuria, frequency, hesitancy or incontinence. Denies joint pain, swelling and limitation in mobility. Denies headaches, seizures, numbness, or tingling. Denies depression,stull has  Anxiety, has had  Insomnia in the past week due to excessive cough , started med yesterday which helped , but has made him very drowsy also Denies skin break down or rash.        Objective:   Physical Exam Patient alert and oriented and in no cardiopulmonary distress.Ill appearing  HEENT: No facial asymmetry, EOMI, maxillary  sinus tenderness,  oropharynx pink and moist.  Neck supple anterior  adenopathy.  Chest: decreased air entry in left lower lobe withj cracklesCVS: S1, S2 no murmurs, no S3.  ABD: Soft non tender. Bowel sounds normal.  Ext: No edema  MS: Adequate ROM spine, shoulders, hips and knees.  Skin: Intact, no ulcerations or rash noted.  Psych: Good eye contact, normal affect. Memory intact anxious but not depressed appearing.  CNS: CN 2-12 intact, power, tone and sensation normal throughout.       Assessment & Plan:

## 2011-01-10 NOTE — Patient Instructions (Signed)
F/u in 2 months, cancel sooner appointment if there is one please.  You are being treated for sinusitis and bronchitis.  Rocephin will be adminitered in the office today and antibiotics and decongestants are sent to your pharmachy. Fluids and rest please.

## 2011-01-10 NOTE — Assessment & Plan Note (Signed)
Acute symptoms, antibiotic prescribed , will get CXR poor air entry in right lower lobe

## 2011-01-14 NOTE — Assessment & Plan Note (Signed)
Antibiotic administered and prescribed 

## 2011-01-16 LAB — T4: T4, Total: 7.8 ug/dL (ref 5.0–12.5)

## 2011-01-24 ENCOUNTER — Telehealth: Payer: Self-pay | Admitting: Family Medicine

## 2011-01-24 MED ORDER — PREDNISONE (PAK) 5 MG PO TABS: ORAL_TABLET | ORAL | Status: DC

## 2011-01-24 NOTE — Telephone Encounter (Signed)
He still has congestion in chest and the cough is starting to come back again and he does not want it to get back as bad as it was. Wanted to know if he can have a refill sent in

## 2011-01-24 NOTE — Telephone Encounter (Signed)
Sent in and patient aware 

## 2011-01-24 NOTE — Telephone Encounter (Signed)
Send in a prednisone dose pack x 6days only and let him  know please

## 2011-02-06 ENCOUNTER — Telehealth: Payer: Self-pay | Admitting: Family Medicine

## 2011-02-07 NOTE — Telephone Encounter (Signed)
Congestion, clear mostly, coughing up phelgm, hasn't tried anything over the counter because he has high BP. I advised Sudafed and saline spray. Also robitussin for cough. Wanted antibiotic but I told him that since mucus was clear, there was no need. Wanted something called in to knock it out. Wants call back either way

## 2011-02-07 NOTE — Telephone Encounter (Signed)
I agree with management plan , nothing to add let him know pls

## 2011-02-07 NOTE — Telephone Encounter (Signed)
Patient aware.

## 2011-03-12 ENCOUNTER — Encounter: Payer: Self-pay | Admitting: Family Medicine

## 2011-03-14 ENCOUNTER — Ambulatory Visit (INDEPENDENT_AMBULATORY_CARE_PROVIDER_SITE_OTHER): Payer: Managed Care, Other (non HMO) | Admitting: Family Medicine

## 2011-03-14 ENCOUNTER — Encounter: Payer: Self-pay | Admitting: Family Medicine

## 2011-03-14 VITALS — BP 122/90 | HR 100 | Resp 16 | Ht 65.0 in | Wt 170.0 lb

## 2011-03-14 DIAGNOSIS — E663 Overweight: Secondary | ICD-10-CM

## 2011-03-14 DIAGNOSIS — F329 Major depressive disorder, single episode, unspecified: Secondary | ICD-10-CM

## 2011-03-14 DIAGNOSIS — B369 Superficial mycosis, unspecified: Secondary | ICD-10-CM

## 2011-03-14 DIAGNOSIS — M549 Dorsalgia, unspecified: Secondary | ICD-10-CM

## 2011-03-14 DIAGNOSIS — I10 Essential (primary) hypertension: Secondary | ICD-10-CM

## 2011-03-14 DIAGNOSIS — E785 Hyperlipidemia, unspecified: Secondary | ICD-10-CM

## 2011-03-14 MED ORDER — TRIAMTERENE-HCTZ 75-50 MG PO TABS: 1.0000 | ORAL_TABLET | Freq: Every day | ORAL | Status: DC

## 2011-03-14 MED ORDER — CLOTRIMAZOLE-BETAMETHASONE 1-0.05 % EX CREA: TOPICAL_CREAM | Freq: Two times a day (BID) | CUTANEOUS | Status: DC

## 2011-03-14 NOTE — Assessment & Plan Note (Signed)
Flare on left elbow

## 2011-03-14 NOTE — Patient Instructions (Signed)
F/u in 3 months.  Dose increase on blood pressure medication, take TWO maxzide 37.5/25 daily till done, nEW pill is ONE daily maxzide 50  It is important that you exercise regularly at least 30 minutes 5 times a week. If you develop chest pain, have severe difficulty breathing, or feel very tired, stop exercising immediately and seek medical attention    A healthy diet is rich in fruit, vegetables and whole grains. Poultry fish, nuts and beans are a healthy choice for protein rather then red meat. A low sodium diet and drinking 64 ounces of water daily is generally recommended. Oils and sweet should be limited. Carbohydrates especially for those who are diabetic or overweight, should be limited to 34-45 gram per meal. It is important to eat on a regular schedule, at least 3 times daily. Snacks should be primarily fruits, vegetables or nuts.   Weight loss goal of 3 pounds per month.  TSH repeated in April just before next visit. Tylenol 2 tablets  Will be given for back pain.

## 2011-03-14 NOTE — Assessment & Plan Note (Signed)
Uncontrolled, dose increase to two tabs daily, then one at higher dose

## 2011-03-18 NOTE — Assessment & Plan Note (Signed)
Marked improvement, in a good relationship, less financial stress, on no meds

## 2011-03-18 NOTE — Progress Notes (Signed)
  Subjective:    Patient ID: Devon Macdonald, male    DOB: 01/28/1981, 31 y.o.   MRN: [id number]  HPI The PT is here for follow up and re-evaluation of chronic medical conditions, medication management and review of any available recent lab and radiology data.  Preventive health is updated, specifically  Cancer screening and Immunization.   Questions or concerns regarding consultations or procedures which the PT has had in the interim are  addressed. The PT denies any adverse reactions to current medications since the last visit.  There are no new concerns.  2 week h/o low back pain aggravated by movement, fell on his back recently and this aggravated the symptom, no radiation to lower extremity    Review of Systems See HPI Denies recent fever or chills. Denies sinus pressure, nasal congestion, ear pain or sore throat. Denies chest congestion, productive cough or wheezing. Denies chest pains, palpitations and leg swelling Denies abdominal pain, nausea, vomiting,diarrhea or constipation.   Denies dysuria, frequency, hesitancy or incontinence.  Denies headaches, seizures, numbness, or tingling. Denies depression, anxiety or insomnia. Denies skin break down or rash.        Objective:   Physical Exam Patient alert and oriented and in no cardiopulmonary distress.  HEENT: No facial asymmetry, EOMI, no sinus tenderness,  oropharynx pink and moist.  Neck supple no adenopathy.  Chest: Clear to auscultation bilaterally.  CVS: S1, S2 no murmurs, no S3.  ABD: Soft non tender. Bowel sounds normal.  Ext: No edema  [id number]   ROM spine,adequate in  shoulders, hips and knees.  Skin: Intact, no ulcerations or rash noted.  Psych: Good eye contact, normal affect. Memory intact not anxious or depressed appearing.  CNS: CN 2-12 intact, power, tone and sensation normal throughout.        Assessment & Plan:

## 2011-03-18 NOTE — Assessment & Plan Note (Signed)
Low fat diet discussed and encouraged, rept labs in 3 month

## 2011-03-18 NOTE — Assessment & Plan Note (Signed)
Acute flare , tylenol given in the office Med prescribed

## 2011-04-04 ENCOUNTER — Encounter: Payer: Self-pay | Admitting: Family Medicine

## 2011-04-04 ENCOUNTER — Ambulatory Visit (INDEPENDENT_AMBULATORY_CARE_PROVIDER_SITE_OTHER): Payer: Managed Care, Other (non HMO) | Admitting: Family Medicine

## 2011-04-04 VITALS — BP 120/82 | HR 91 | Temp 98.7°F | Resp 16 | Ht 65.0 in | Wt 168.0 lb

## 2011-04-04 DIAGNOSIS — R5381 Other malaise: Secondary | ICD-10-CM

## 2011-04-04 DIAGNOSIS — E785 Hyperlipidemia, unspecified: Secondary | ICD-10-CM

## 2011-04-04 DIAGNOSIS — J309 Allergic rhinitis, unspecified: Secondary | ICD-10-CM

## 2011-04-04 DIAGNOSIS — J0100 Acute maxillary sinusitis, unspecified: Secondary | ICD-10-CM

## 2011-04-04 DIAGNOSIS — I10 Essential (primary) hypertension: Secondary | ICD-10-CM

## 2011-04-04 DIAGNOSIS — J209 Acute bronchitis, unspecified: Secondary | ICD-10-CM

## 2011-04-04 MED ORDER — FLUTICASONE PROPIONATE 50 MCG/ACT NA SUSP: 1.0000 | Freq: Every day | NASAL | Status: DC

## 2011-04-04 MED ORDER — AZITHROMYCIN 250 MG PO TABS: ORAL_TABLET | ORAL | Status: DC

## 2011-04-04 MED ORDER — BENZONATATE 100 MG PO CAPS: 100.0000 mg | ORAL_CAPSULE | Freq: Four times a day (QID) | ORAL | Status: DC | PRN

## 2011-04-04 NOTE — Patient Instructions (Signed)
F/u in 3 month, cancel sooner appt.  TSH in 3 months before next visit  Pls submit sputum today for testing if possible  You are being treated for acute bronchitis.  Work excuse for today only.  You need to use flonase nasal spray daily and saline nasal flushes once or twice daily, to reduce nasal secretions and congestion.  Keep the decongestant pills on hand tessalon perles to use when you start experiencing chest congestion.  A lot of congestion is due to uncontrolled allergies or viruses which the body heals on its own

## 2011-04-04 NOTE — Assessment & Plan Note (Signed)
Controlled, no change in medication  

## 2011-04-21 NOTE — Progress Notes (Signed)
  Subjective:    Patient ID: Devon Macdonald, male    DOB: 1981-02-08, 31 y.o.   MRN: [id number]  HPI C/o chest congestion with cough productive of yellow sputum for the past 5 days, denies sinus pressure or sore throat, has had intermittent chills and low grade fever. Feels weak, appetite is poor since symptom onset     Review of Systems    See HPI  Denies sinus pressure,c/o  nasal congestion,denies  ear pain or sore throat.  Denies chest pains, palpitations and leg swelling Denies abdominal pain, nausea, vomiting,diarrhea or constipation.   Denies dysuria, frequency, hesitancy or incontinence. Denies joint pain, swelling and limitation in mobility. Denies headaches, seizures, numbness, or tingling. Denies depression, anxiety or insomnia. Denies skin break down or rash.     Objective:   Physical Exam Patient alert and oriented and in no cardiopulmonary distress.  HEENT: No facial asymmetry, EOMI, no sinus tenderness,  oropharynx pink and moist.  Neck supple no adenopathy.  Chest:decreased air entry, scattered crackles, no wheezes  CVS: S1, S2 no murmurs, no S3.  ABD: Soft non tender. Bowel sounds normal.  Ext: No edema  MS: Adequate ROM spine, shoulders, hips and knees.  Skin: Intact, no ulcerations or rash noted.  Psych: Good eye contact, normal affect. Memory intact not anxious or depressed appearing.  CNS: CN 2-12 intact, power, tone and sensation normal throughout.        Assessment & Plan:

## 2011-04-21 NOTE — Assessment & Plan Note (Signed)
Acute symptoms, antibiotic and decongestant prescribed 

## 2011-04-21 NOTE — Assessment & Plan Note (Signed)
Hyperlipidemia:Low fat diet discussed and encouraged.   

## 2011-04-21 NOTE — Assessment & Plan Note (Signed)
Importance of regular use of nasal spray stressed

## 2011-06-13 ENCOUNTER — Ambulatory Visit: Payer: Managed Care, Other (non HMO) | Admitting: Family Medicine

## 2011-07-04 ENCOUNTER — Encounter: Payer: Self-pay | Admitting: Family Medicine

## 2011-07-04 ENCOUNTER — Ambulatory Visit (INDEPENDENT_AMBULATORY_CARE_PROVIDER_SITE_OTHER): Payer: Managed Care, Other (non HMO) | Admitting: Family Medicine

## 2011-07-04 VITALS — BP 128/76 | HR 72 | Resp 18 | Ht 65.0 in | Wt 167.0 lb

## 2011-07-04 DIAGNOSIS — E663 Overweight: Secondary | ICD-10-CM

## 2011-07-04 DIAGNOSIS — R7989 Other specified abnormal findings of blood chemistry: Secondary | ICD-10-CM | POA: Insufficient documentation

## 2011-07-04 DIAGNOSIS — I10 Essential (primary) hypertension: Secondary | ICD-10-CM

## 2011-07-04 DIAGNOSIS — J309 Allergic rhinitis, unspecified: Secondary | ICD-10-CM

## 2011-07-04 DIAGNOSIS — E785 Hyperlipidemia, unspecified: Secondary | ICD-10-CM

## 2011-07-04 DIAGNOSIS — G43909 Migraine, unspecified, not intractable, without status migrainosus: Secondary | ICD-10-CM

## 2011-07-04 DIAGNOSIS — R6889 Other general symptoms and signs: Secondary | ICD-10-CM

## 2011-07-04 LAB — BASIC METABOLIC PANEL
CO2: 31 mEq/L (ref 19–32)
Calcium: 10.4 mg/dL (ref 8.4–10.5)
Chloride: 99 mEq/L (ref 96–112)
Creat: 1.2 mg/dL (ref 0.50–1.35)
Sodium: 137 mEq/L (ref 135–145)

## 2011-07-04 MED ORDER — SUMATRIPTAN SUCCINATE 100 MG PO TABS: ORAL_TABLET | ORAL | Status: DC

## 2011-07-04 NOTE — Progress Notes (Signed)
  Subjective:    Patient ID: Devon Macdonald, male    DOB: 1981-02-02, 31 y.o.   MRN: [id number]  HPI The PT is here for follow up and re-evaluation of chronic medical conditions, medication management and review of any available recent lab and radiology data.  Preventive health is updated, specifically  Cancer screening and Immunization.   Questions or concerns regarding consultations or procedures which the PT has had in the interim are  addressed. The PT denies any adverse reactions to current medications since the last visit.  There are no new concerns.  C/o increased frequency of migraine headaches in past  Month, on avg 2 per month requests med      Review of Systems See HPI Denies recent fever or chills. Denies sinus pressure, nasal congestion, ear pain or sore throat. Denies chest congestion, productive cough or wheezing. Denies chest pains, palpitations and leg swelling Denies abdominal pain, nausea, vomiting,diarrhea or constipation.   Denies dysuria, frequency, hesitancy or incontinence. Denies joint pain, swelling and limitation in mobility. Denies , seizures, numbness, or tingling. Denies depression, anxiety or insomnia. Denies skin break down or rash.        Objective:   Physical Exam Patient alert and oriented and in no cardiopulmonary distress.  HEENT: No facial asymmetry, EOMI, no sinus tenderness,  oropharynx pink and moist.  Neck supple no adenopathy.  Chest: Clear to auscultation bilaterally.  CVS: S1, S2 no murmurs, no S3.  ABD: Soft non tender. Bowel sounds normal.  Ext: No edema  MS: Adequate ROM spine, shoulders, hips and knees.  Skin: Intact, no ulcerations or rash noted.  Psych: Good eye contact, normal affect. Memory intact not anxious or depressed appearing.  CNS: CN 2-12 intact, power, tone and sensation normal throughout.        Assessment & Plan:

## 2011-07-04 NOTE — Patient Instructions (Signed)
F/u end November.  Labs today, chem 7 and TSH  Fasting lipid, chem 7, cbc in November before visit.  New medication for use with migraines  It is important that you exercise regularly at least 30 minutes 5 times a week. If you develop chest pain, have severe difficulty breathing, or feel very tired, stop exercising immediately and seek medical attention    A healthy diet is rich in fruit, vegetables and whole grains. Poultry fish, nuts and beans are a healthy choice for protein rather then red meat. A low sodium diet and drinking 64 ounces of water daily is generally recommended. Oils and sweet should be limited. Carbohydrates especially for those who are diabetic or overweight, should be limited to 30-45 gram per meal. It is important to eat on a regular schedule, at least 3 times daily. Snacks should be primarily fruits, vegetables or nuts.   Weight loss goal of 5 to 7 pounds

## 2011-07-04 NOTE — Assessment & Plan Note (Signed)
Hyperlipidemia:Low fat diet discussed and encouraged.  No meds at this time 

## 2011-07-04 NOTE — Assessment & Plan Note (Signed)
Controlled, no change in medication  

## 2011-07-05 NOTE — Assessment & Plan Note (Signed)
Unchanged. Patient re-educated about  the importance of commitment to a  minimum of 150 minutes of exercise per week. The importance of healthy food choices with portion control discussed. Encouraged to start a food diary, count calories and to consider  joining a support group. Sample diet sheets offered. Goals set by the patient for the next several months.    

## 2011-07-05 NOTE — Assessment & Plan Note (Signed)
Uncontrolled, will prescribe imitrex for acute headache, counseled o avoid any noted triggers

## 2011-07-05 NOTE — Assessment & Plan Note (Signed)
Less symptomatic currently as needed use of flonase only

## 2011-09-28 ENCOUNTER — Telehealth: Payer: Self-pay | Admitting: Family Medicine

## 2011-09-28 DIAGNOSIS — M549 Dorsalgia, unspecified: Secondary | ICD-10-CM

## 2011-09-28 MED ORDER — CYCLOBENZAPRINE HCL 10 MG PO TABS: ORAL_TABLET | ORAL | Status: DC

## 2011-09-28 NOTE — Telephone Encounter (Signed)
Refill sent.

## 2011-11-02 ENCOUNTER — Telehealth: Payer: Self-pay | Admitting: Family Medicine

## 2011-11-02 NOTE — Telephone Encounter (Signed)
Nothing can be sent for sinus infection. Needs appt

## 2011-11-07 NOTE — Telephone Encounter (Signed)
Called pt no answer °

## 2011-11-23 ENCOUNTER — Emergency Department (INDEPENDENT_AMBULATORY_CARE_PROVIDER_SITE_OTHER)
Admission: EM | Admit: 2011-11-23 | Discharge: 2011-11-23 | Disposition: A | Discharge status: Home / Self Care | Payer: Managed Care, Other (non HMO) | Source: Home / Self Care

## 2011-11-23 ENCOUNTER — Encounter (HOSPITAL_COMMUNITY): Payer: Self-pay | Admitting: Emergency Medicine

## 2011-11-23 DIAGNOSIS — R21 Rash and other nonspecific skin eruption: Secondary | ICD-10-CM

## 2011-11-23 DIAGNOSIS — R109 Unspecified abdominal pain: Secondary | ICD-10-CM

## 2011-11-23 DIAGNOSIS — J029 Acute pharyngitis, unspecified: Secondary | ICD-10-CM

## 2011-11-23 LAB — POCT RAPID STREP A: Streptococcus, Group A Screen (Direct): NEGATIVE

## 2011-11-23 MED ORDER — TRIAMCINOLONE ACETONIDE 0.1 % EX CREA: TOPICAL_CREAM | Freq: Two times a day (BID) | CUTANEOUS | Status: DC

## 2011-11-23 MED ORDER — HYOSCYAMINE SULFATE 0.125 MG SL SUBL: 0.1250 mg | SUBLINGUAL_TABLET | SUBLINGUAL | Status: DC | PRN

## 2011-11-23 MED ORDER — AMOXICILLIN 500 MG PO CAPS: ORAL_CAPSULE | ORAL | Status: DC

## 2011-11-23 MED ORDER — KETOROLAC TROMETHAMINE 60 MG/2ML IM SOLN
60.0000 mg | Freq: Once | INTRAMUSCULAR | Status: AC
  Administered 2011-11-23: 60 mg via INTRAMUSCULAR

## 2011-11-23 MED ORDER — KETOROLAC TROMETHAMINE 60 MG/2ML IM SOLN
INTRAMUSCULAR | Status: AC
  Filled 2011-11-23: qty 2

## 2011-11-23 NOTE — ED Notes (Signed)
Patient thought a script for hand condition, mentioned to [place], np.  Script provided by [employer], np.  Patient now questioning a script for abdominal pain, david notified.

## 2011-11-23 NOTE — ED Provider Notes (Signed)
Medical screening examination/treatment/procedure(s) were performed by non-physician practitioner and as supervising physician I was immediately available for consultation/collaboration.  Leslee Home, M.D.   Reuben Likes, MD 11/23/11 [phone]

## 2011-11-23 NOTE — ED Notes (Signed)
Pt states hands are burning and itching. No redness observed but mild skin peeling.

## 2011-11-23 NOTE — ED Notes (Signed)
Thursday am, woke with legs feeling heavy and throat feeling tight. Slept all day yesterday then woke today with hands itching and burning, throat feeling tighter and abdominal cramping.

## 2011-11-23 NOTE — ED Provider Notes (Signed)
History     CSN: [id number]  Arrival date & time 11/23/11  1400   None     Chief Complaint  Patient presents with  . Allergic Reaction    (Consider location/radiation/quality/duration/timing/severity/associated sxs/prior treatment) HPI Comments: 31 year old male presents with chief complaint sore throat started yesterday. Over the ensuing hours  developed some numbness in the legs and cramping in the abdomen ; hands feel like they're on  fire. He has a rash on both hands that he describes as a fine type rash and both hands are burning like fire. Denies fever chills or earache. Denies known insect, tick, or spider bites.  Patient is a 31 y.o. male presenting with allergic reaction.  Allergic Reaction The primary symptoms are  abdominal pain and rash. The primary symptoms do not include shortness of breath, cough, vomiting, diarrhea, palpitations, altered mental status or angioedema. The current episode started yesterday. The problem has been gradually worsening.  The rash began today.  Significant symptoms that are not present include rhinorrhea.    Past Medical History  Diagnosis Date  . Obesity   . Hyperlipidemia   . Hypertension     Past Surgical History  Procedure Date  . Lithrostripsy 2003  . Broke right femur 1997  . Broke right hand 2003    Family History  Problem Relation Age of Onset  . Diabetes Mother   . Diabetes Father     History  Substance Use Topics  . Smoking status: Never Smoker   . Smokeless tobacco: Not on file  . Alcohol Use: No      Review of Systems  Constitutional: Negative for fever, activity change and fatigue.  HENT: Positive for sore throat and neck pain. Negative for rhinorrhea and drooling.   Respiratory: Negative for cough and shortness of breath.   Cardiovascular: Negative for palpitations.  Gastrointestinal: Positive for abdominal pain. Negative for vomiting and diarrhea.  Musculoskeletal: Negative for joint swelling and  arthralgias.  Skin: Positive for rash.  Neurological: Negative.   Psychiatric/Behavioral: Negative for altered mental status.    Allergies  Review of patient's allergies indicates no known allergies.  Home Medications   Current Outpatient Rx  Name Route Sig Dispense Refill  . CYCLOBENZAPRINE HCL 10 MG PO TABS  One tablet at bedtime as needed for back spasm 30 tablet 0  . ONE-DAILY MULTI VITAMINS PO TABS Oral Take 1 tablet by mouth daily.      . SUMATRIPTAN SUCCINATE 100 MG PO TABS  One tablet at headache onset, may repeat after 2 hours if needed for uncontrolled headache. Maximum use is twice weekly 8 tablet 2  . AMOXICILLIN 500 MG PO CAPS  2 BID for 8 days 32 capsule 0  . FLUTICASONE PROPIONATE 50 MCG/ACT NA SUSP Nasal Place 1 spray into the nose daily. 16 g 2  . GREEN TEA (CAMILLIA SINENSIS) 150 MG PO CAPS Oral Take 4 capsules by mouth daily.      . TRIAMTERENE-HCTZ 75-50 MG PO TABS Oral Take 1 tablet by mouth daily. 90 tablet 4    Dose increase effective 03/13/2010    BP 136/90  Pulse 106  Temp 98.5 F (36.9 C)  Resp 20  Ht 5' 5.5" (1.664 m)  Wt 168 lb (76.204 kg)  BMI 27.53 kg/m2  SpO2 100%  Physical Exam  Constitutional: He is oriented to person, place, and time. He appears well-developed and well-nourished. No distress.  HENT:  Head: Normocephalic.  Right Ear: External ear normal.  Left Ear:  External ear normal.  Mouth/Throat: Oropharyngeal exudate present.       Although I was unable to observe the OP well  the upper OP is erythematous with a couple of white exudate.  Eyes: Conjunctivae normal and EOM are normal.  Neck: Normal range of motion. Neck supple.  Cardiovascular: Normal rate, regular rhythm and normal heart sounds.   Pulmonary/Chest: Effort normal and breath sounds normal. No respiratory distress. He has no wheezes.  Abdominal: Soft. There is no tenderness.  Musculoskeletal: Normal range of motion. He exhibits no edema.  Lymphadenopathy:    He has  cervical adenopathy.  Neurological: He is alert and oriented to person, place, and time.  Skin: Skin is warm and dry. Rash noted.       Fine papular rash to palms of both hands.  Psychiatric: He has a normal mood and affect.    ED Course  Procedures (including critical care time)   Labs Reviewed  POCT RAPID STREP A ([facility] CARE ONLY)   No results found.   1. Pharyngitis   2. Abdominal cramps   3. Rash of hands       MDM   Results for orders placed during the hospital encounter of 11/23/11  POCT RAPID STREP A ([facility] ONLY)      Component Value Range   Streptococcus, Group A Screen (Direct) NEGATIVE  NEGATIVE   From a Clinical standpoint and I am not convinced he does not have a Streptococcus infection.. Or it may be from another bacteria and certainly possibly viral infection. This would explain most of his other seemingly unrelated symptoms including rash and abdominal pain/cramping. Other considerations would be a bite from a spider insect or tick. He does not have joint pains. If this time no fever. His significant other will help him to locate any potential  lesions that may represent a bite or even an attached tick this afternoon. He is admonished to seek medical care at the emergency department for any worsening new symptoms or problems including fever myalgias muscle cramping abdominal pain worsening of the rash or other symptoms.        Hayden Rasmussen, NP 11/23/11 1650

## 2011-11-25 ENCOUNTER — Emergency Department (HOSPITAL_COMMUNITY)
Admission: EM | Admit: 2011-11-25 | Discharge: 2011-11-25 | Disposition: A | Discharge status: Home / Self Care | Payer: Managed Care, Other (non HMO) | Attending: Emergency Medicine | Admitting: Emergency Medicine

## 2011-11-25 ENCOUNTER — Encounter (HOSPITAL_COMMUNITY): Payer: Self-pay | Admitting: Emergency Medicine

## 2011-11-25 DIAGNOSIS — I10 Essential (primary) hypertension: Secondary | ICD-10-CM | POA: Insufficient documentation

## 2011-11-25 DIAGNOSIS — A779 Spotted fever, unspecified: Secondary | ICD-10-CM | POA: Insufficient documentation

## 2011-11-25 DIAGNOSIS — A770 Spotted fever due to Rickettsia rickettsii: Secondary | ICD-10-CM

## 2011-11-25 DIAGNOSIS — E669 Obesity, unspecified: Secondary | ICD-10-CM | POA: Insufficient documentation

## 2011-11-25 DIAGNOSIS — E785 Hyperlipidemia, unspecified: Secondary | ICD-10-CM | POA: Insufficient documentation

## 2011-11-25 MED ORDER — OXYCODONE-ACETAMINOPHEN 5-325 MG PO TABS: 2.0000 | ORAL_TABLET | ORAL | Status: DC | PRN

## 2011-11-25 MED ORDER — DOXYCYCLINE HYCLATE 100 MG PO CAPS: 100.0000 mg | ORAL_CAPSULE | Freq: Two times a day (BID) | ORAL | Status: DC

## 2011-11-25 MED ORDER — HYDROXYZINE HCL 50 MG/ML IM SOLN
50.0000 mg | Freq: Once | INTRAMUSCULAR | Status: AC
  Administered 2011-11-25: 50 mg via INTRAMUSCULAR
  Filled 2011-11-25: qty 1

## 2011-11-25 NOTE — ED Notes (Signed)
Pt c/o rash to hand and feet that is painful x 5 days; pt sts seen at [facility] and given antibiotics for strep throat; pt sts rash worse

## 2011-11-25 NOTE — ED Provider Notes (Signed)
History  This chart was scribed for Hurman Horn, MD by Erskine Emery. This patient was seen in room [facility] and the patient's care was started at 15:30.   CSN: [id number]  Arrival date & time 11/25/11  1409   None     Chief Complaint  Patient presents with  . Rash    (Consider location/radiation/quality/duration/timing/severity/associated sxs/prior treatment) The history is provided by the patient. No language interpreter was used.  Devon Macdonald is a 31 y.o. male who presents to the Emergency Department complaining of a rash to his hands, feet, face, and ears for the past 5 days with a bite noted on the foot. Pt reports some associated mild throat soreness, right-sided abdominal cramping, and joint aches in the hands but not elsewhere. Pt denies any associated fever, confusion, SOB, or emesis. Pt went to [facility] Friday (3 days ago) and was treated for strep; his symptoms have worsened since.   Past Medical History  Diagnosis Date  . Obesity   . Hyperlipidemia   . Hypertension     Past Surgical History  Procedure Date  . Lithrostripsy 2003  . Broke right femur 1997  . Broke right hand 2003    Family History  Problem Relation Age of Onset  . Diabetes Mother   . Diabetes Father     History  Substance Use Topics  . Smoking status: Never Smoker   . Smokeless tobacco: Not on file  . Alcohol Use: No      Review of Systems 10 Systems reviewed and are negative for acute change except as noted in the HPI.   Allergies  Review of patient's allergies indicates no known allergies.  Home Medications   Current Outpatient Rx  Name Route Sig Dispense Refill  . AMOXICILLIN 500 MG PO CAPS Oral Take 1,000 mg by mouth 2 (two) times daily. For 8 days Started 9/3    . HYOSCYAMINE SULFATE 0.125 MG SL SUBL Sublingual Place 1 tablet (0.125 mg total) under the tongue every 4 (four) hours as needed for cramping. 30 tablet 0  . ADULT MULTIVITAMIN W/MINERALS CH Oral Take 1  tablet by mouth daily.    . TRIAMTERENE-HCTZ 75-50 MG PO TABS Oral Take 1 tablet by mouth daily. 90 tablet 4    Dose increase effective 03/13/2010  . DOXYCYCLINE HYCLATE 100 MG PO CAPS Oral Take 1 capsule (100 mg total) by mouth 2 (two) times daily. One po bid x 14 days 28 capsule 0  . OXYCODONE-ACETAMINOPHEN 5-325 MG PO TABS Oral Take 2 tablets by mouth every 4 (four) hours as needed for pain. 30 tablet 0    BP 127/91  Pulse 100  Temp 98.4 F (36.9 C) (Oral)  Resp 12  SpO2 100%  Physical Exam  Nursing note and vitals reviewed. Constitutional:       Awake, alert, nontoxic appearance.  HENT:  Head: Atraumatic.       Throat is moist but there's scattered petichiae on the back of the throat. Mild erythema on tonsils without exudate or swelling.  Eyes: Right eye exhibits no discharge. Left eye exhibits no discharge.  Neck: Neck supple.  Pulmonary/Chest: Effort normal and breath sounds normal. No respiratory distress. He has no wheezes. He exhibits no tenderness.  Abdominal: Soft. There is no tenderness. There is no rebound.       No HSM.  Musculoskeletal: He exhibits no tenderness.       Hands diffusely tender. CR less than 2 seconds with good color in  hands and toes. Baseline ROM, no obvious new focal weakness.  Lymphadenopathy:    He has no cervical adenopathy.  Neurological:       Mental status and motor strength appears baseline for patient and situation.  Skin: No rash noted.       Petichial rash on the palms of both hands and the soles of both feet. Small 5mm papule noted on the left anterior ankle with minimal erythema and no tenderness, fulcutants, puss, or cellulitis.  Psychiatric: He has a normal mood and affect.    ED Course  Procedures (including critical care time) DIAGNOSTIC STUDIES: Oxygen Saturation is 100% on room air, normal by my interpretation.    COORDINATION OF CARE: 15:30--Patient / Family / Caregiver informed of clinical course, understand medical  decision-making process, and agree with plan.   Labs Reviewed - No data to display No results found.   1. [facility] spotted fever       MDM   Patient / Family / Caregiver informed of clinical course, understand medical decision-making process, and agree with plan. I personally performed the services described in this documentation, which was scribed in my presence. The recorded information has been reviewed and considered. I doubt any other EMC precluding discharge at this time including, but not necessarily limited to the following:sepsis.   Hurman Horn, MD 11/26/11 [phone]

## 2011-11-27 ENCOUNTER — Encounter (HOSPITAL_COMMUNITY): Payer: Self-pay | Admitting: Physical Medicine and Rehabilitation

## 2011-11-27 ENCOUNTER — Inpatient Hospital Stay (HOSPITAL_COMMUNITY)
Admission: EM | Admit: 2011-11-27 | Discharge: 2011-11-30 | DRG: 728 | Disposition: A | Discharge status: Home / Self Care | Payer: Managed Care, Other (non HMO) | Attending: Internal Medicine | Admitting: Internal Medicine

## 2011-11-27 ENCOUNTER — Emergency Department (HOSPITAL_COMMUNITY): Payer: Managed Care, Other (non HMO)

## 2011-11-27 DIAGNOSIS — N451 Epididymitis: Secondary | ICD-10-CM

## 2011-11-27 DIAGNOSIS — R7402 Elevation of levels of lactic acid dehydrogenase (LDH): Secondary | ICD-10-CM | POA: Diagnosis present

## 2011-11-27 DIAGNOSIS — E669 Obesity, unspecified: Secondary | ICD-10-CM | POA: Diagnosis present

## 2011-11-27 DIAGNOSIS — R7401 Elevation of levels of liver transaminase levels: Secondary | ICD-10-CM | POA: Diagnosis present

## 2011-11-27 DIAGNOSIS — E785 Hyperlipidemia, unspecified: Secondary | ICD-10-CM | POA: Diagnosis present

## 2011-11-27 DIAGNOSIS — Y92009 Unspecified place in unspecified non-institutional (private) residence as the place of occurrence of the external cause: Secondary | ICD-10-CM

## 2011-11-27 DIAGNOSIS — A779 Spotted fever, unspecified: Secondary | ICD-10-CM | POA: Diagnosis present

## 2011-11-27 DIAGNOSIS — G43909 Migraine, unspecified, not intractable, without status migrainosus: Secondary | ICD-10-CM

## 2011-11-27 DIAGNOSIS — T364X5A Adverse effect of tetracyclines, initial encounter: Secondary | ICD-10-CM | POA: Diagnosis present

## 2011-11-27 DIAGNOSIS — Z6827 Body mass index (BMI) 27.0-27.9, adult: Secondary | ICD-10-CM

## 2011-11-27 DIAGNOSIS — I10 Essential (primary) hypertension: Secondary | ICD-10-CM | POA: Diagnosis present

## 2011-11-27 DIAGNOSIS — R109 Unspecified abdominal pain: Secondary | ICD-10-CM | POA: Diagnosis present

## 2011-11-27 DIAGNOSIS — B369 Superficial mycosis, unspecified: Secondary | ICD-10-CM

## 2011-11-27 DIAGNOSIS — R21 Rash and other nonspecific skin eruption: Secondary | ICD-10-CM

## 2011-11-27 DIAGNOSIS — N453 Epididymo-orchitis: Principal | ICD-10-CM | POA: Diagnosis present

## 2011-11-27 DIAGNOSIS — R51 Headache: Secondary | ICD-10-CM | POA: Diagnosis present

## 2011-11-27 LAB — CBC WITH DIFFERENTIAL/PLATELET
Eosinophils Relative: 0 % (ref 0–5)
HCT: 46 % (ref 39.0–52.0)
Lymphocytes Relative: 8 % — ABNORMAL LOW (ref 12–46)
Lymphs Abs: 1 10*3/uL (ref 0.7–4.0)
MCV: 84.2 fL (ref 78.0–100.0)
Monocytes Absolute: 1.3 10*3/uL — ABNORMAL HIGH (ref 0.1–1.0)
RBC: 5.46 MIL/uL (ref 4.22–5.81)
WBC: 13.1 10*3/uL — ABNORMAL HIGH (ref 4.0–10.5)

## 2011-11-27 LAB — COMPREHENSIVE METABOLIC PANEL
ALT: 124 U/L — ABNORMAL HIGH (ref 0–53)
CO2: 30 mEq/L (ref 19–32)
Calcium: 10.2 mg/dL (ref 8.4–10.5)
Creatinine, Ser: 1.25 mg/dL (ref 0.50–1.35)
GFR calc Af Amer: 88 mL/min — ABNORMAL LOW (ref >90)
GFR calc non Af Amer: 76 mL/min — ABNORMAL LOW (ref >90)
Glucose, Bld: 126 mg/dL — ABNORMAL HIGH (ref 70–99)

## 2011-11-27 LAB — URINALYSIS, ROUTINE W REFLEX MICROSCOPIC
Nitrite: NEGATIVE
Protein, ur: NEGATIVE mg/dL
Specific Gravity, Urine: 1.021 (ref 1.005–1.030)
Urobilinogen, UA: 1 mg/dL (ref 0.0–1.0)

## 2011-11-27 MED ORDER — MORPHINE SULFATE 2 MG/ML IJ SOLN
1.0000 mg | INTRAMUSCULAR | Status: DC | PRN
  Administered 2011-11-28 (×2): 1 mg via INTRAVENOUS
  Filled 2011-11-27 (×2): qty 1

## 2011-11-27 MED ORDER — CEFTRIAXONE SODIUM 250 MG IJ SOLR
250.0000 mg | Freq: Once | INTRAMUSCULAR | Status: AC
  Administered 2011-11-27: 250 mg via INTRAMUSCULAR
  Filled 2011-11-27: qty 250

## 2011-11-27 MED ORDER — LIDOCAINE HCL (PF) 1 % IJ SOLN
INTRAMUSCULAR | Status: AC
  Administered 2011-11-27: 1 mL
  Filled 2011-11-27: qty 5

## 2011-11-27 MED ORDER — ALUM & MAG HYDROXIDE-SIMETH 200-200-20 MG/5ML PO SUSP: 30.0000 mL | Freq: Four times a day (QID) | ORAL | Status: DC | PRN

## 2011-11-27 MED ORDER — SODIUM CHLORIDE 0.9 % IV BOLUS (SEPSIS)
1000.0000 mL | Freq: Once | INTRAVENOUS | Status: AC
  Administered 2011-11-27: 1000 mL via INTRAVENOUS

## 2011-11-27 MED ORDER — OXYCODONE-ACETAMINOPHEN 5-325 MG PO TABS
2.0000 | ORAL_TABLET | ORAL | Status: DC | PRN
  Administered 2011-11-27 – 2011-11-29 (×7): 2 via ORAL
  Filled 2011-11-27 (×7): qty 2

## 2011-11-27 MED ORDER — SODIUM CHLORIDE 0.9 % IJ SOLN: 3.0000 mL | INTRAMUSCULAR | Status: DC | PRN

## 2011-11-27 MED ORDER — DEXTROSE 5 % IV SOLN
50.0000 mg/kg/d | Freq: Four times a day (QID) | INTRAVENOUS | Status: DC
  Administered 2011-11-28: 950 mg via INTRAVENOUS
  Filled 2011-11-27 (×5): qty 950

## 2011-11-27 MED ORDER — TRIAMTERENE-HCTZ 75-50 MG PO TABS
1.0000 | ORAL_TABLET | Freq: Every day | ORAL | Status: DC
  Administered 2011-11-27 – 2011-11-29 (×3): 1 via ORAL
  Filled 2011-11-27 (×4): qty 1

## 2011-11-27 MED ORDER — HYDROMORPHONE HCL PF 1 MG/ML IJ SOLN
1.0000 mg | Freq: Once | INTRAMUSCULAR | Status: AC
  Administered 2011-11-27: 1 mg via INTRAVENOUS
  Filled 2011-11-27: qty 1

## 2011-11-27 MED ORDER — KETOROLAC TROMETHAMINE 30 MG/ML IJ SOLN
30.0000 mg | Freq: Once | INTRAMUSCULAR | Status: AC
  Administered 2011-11-27: 30 mg via INTRAVENOUS
  Filled 2011-11-27: qty 1

## 2011-11-27 MED ORDER — ACETAMINOPHEN 650 MG RE SUPP: 650.0000 mg | Freq: Four times a day (QID) | RECTAL | Status: DC | PRN

## 2011-11-27 MED ORDER — ONDANSETRON HCL 4 MG/2ML IJ SOLN
4.0000 mg | Freq: Four times a day (QID) | INTRAMUSCULAR | Status: DC | PRN
  Administered 2011-11-29 (×2): 4 mg via INTRAVENOUS
  Filled 2011-11-27 (×2): qty 2

## 2011-11-27 MED ORDER — HYOSCYAMINE SULFATE 0.125 MG SL SUBL
0.1250 mg | SUBLINGUAL_TABLET | SUBLINGUAL | Status: DC | PRN
  Filled 2011-11-27: qty 1

## 2011-11-27 MED ORDER — ONDANSETRON HCL 8 MG PO TABS
4.0000 mg | ORAL_TABLET | Freq: Four times a day (QID) | ORAL | Status: DC | PRN
  Filled 2011-11-27: qty 0.5

## 2011-11-27 MED ORDER — ONDANSETRON HCL 4 MG/2ML IJ SOLN
4.0000 mg | Freq: Once | INTRAMUSCULAR | Status: AC
  Administered 2011-11-27: 4 mg via INTRAVENOUS
  Filled 2011-11-27: qty 2

## 2011-11-27 MED ORDER — ACETAMINOPHEN 325 MG PO TABS
650.0000 mg | ORAL_TABLET | Freq: Four times a day (QID) | ORAL | Status: DC | PRN
  Administered 2011-11-29: 650 mg via ORAL
  Filled 2011-11-27: qty 2

## 2011-11-27 MED ORDER — SENNOSIDES-DOCUSATE SODIUM 8.6-50 MG PO TABS
1.0000 | ORAL_TABLET | Freq: Every evening | ORAL | Status: DC | PRN
  Administered 2011-11-29: 1 via ORAL
  Filled 2011-11-27: qty 1

## 2011-11-27 MED ORDER — ENOXAPARIN SODIUM 40 MG/0.4ML ~~LOC~~ SOLN
40.0000 mg | SUBCUTANEOUS | Status: DC
  Administered 2011-11-27 – 2011-11-29 (×3): 40 mg via SUBCUTANEOUS
  Filled 2011-11-27 (×4): qty 0.4

## 2011-11-27 MED ORDER — ACETAMINOPHEN 325 MG PO TABS
650.0000 mg | ORAL_TABLET | Freq: Once | ORAL | Status: AC
  Administered 2011-11-27: 650 mg via ORAL
  Filled 2011-11-27: qty 2

## 2011-11-27 NOTE — ED Notes (Signed)
Pt presents to department for evaluation of headache. States he was diagnosed with [facility] Spotted Fever on Sunday and placed on doxycycline. Now states that his hands and feet feel better, but has a severe headache. Concerned this could be reaction to medication. 10/10 headache at the time. He is alert and oriented x4.

## 2011-11-27 NOTE — ED Notes (Addendum)
Pt reports pain and cramping in rt side that extends into rt groin. This pain started since last night. Pt states that "my urine is dark in color". Pt states that it hurts to urinate but states that it does not burn.

## 2011-11-27 NOTE — ED Provider Notes (Signed)
History     CSN: [id number]  Arrival date & time 11/27/11  1227   First MD Initiated Contact with Patient 11/27/11 1635      Chief Complaint  Patient presents with  . Headache    (Consider location/radiation/quality/duration/timing/severity/associated sxs/prior treatment) HPI Pt reports was seen about 4 days ago at the [facility] for sore throat and rash on hands and feet. Felt to be Strep throat, given Amoxil but symptoms did not improve. Seen at [facility] 2 days ago for recheck and felt to have RMSF. Started on doxycycline. Rash seems to have gotten better but now has severe headache, abdominal cramping, severe aching R testicular pain, worse with movement and urination. No cough or SOB. No neck stiffness.   Past Medical History  Diagnosis Date  . Obesity   . Hyperlipidemia   . Hypertension     Past Surgical History  Procedure Date  . Lithrostripsy 2003  . Broke right femur 1997  . Broke right hand 2003    Family History  Problem Relation Age of Onset  . Diabetes Mother   . Diabetes Father     History  Substance Use Topics  . Smoking status: Never Smoker   . Smokeless tobacco: Not on file  . Alcohol Use: No      Review of Systems All other systems reviewed and are negative except as noted in HPI.   Allergies  Review of patient's allergies indicates no known allergies.  Home Medications   Current Outpatient Rx  Name Route Sig Dispense Refill  . AMOXICILLIN 500 MG PO CAPS Oral Take 1,000 mg by mouth 2 (two) times daily. For 8 days; Start date 11/22/11    . DOXYCYCLINE HYCLATE 100 MG PO CAPS Oral Take 100 mg by mouth 2 (two) times daily. For 14 days; Start date 11/22/11    . HYOSCYAMINE SULFATE 0.125 MG SL SUBL Sublingual Place 0.125 mg under the tongue every 4 (four) hours as needed. For cramping    . ADULT MULTIVITAMIN W/MINERALS CH Oral Take 1 tablet by mouth daily.    . OXYCODONE-ACETAMINOPHEN 5-325 MG PO TABS Oral Take 2 tablets by mouth every 4 (four) hours as  needed. For pain    . TRIAMTERENE-HCTZ 75-50 MG PO TABS Oral Take 1 tablet by mouth daily. 90 tablet 4    Dose increase effective 03/13/2010    BP 109/47  Pulse 110  Temp 101.8 F (38.8 C) (Oral)  Resp 18  SpO2 100%  Physical Exam  Nursing note and vitals reviewed. Constitutional: He is oriented to person, place, and time. He appears well-developed and well-nourished.  HENT:  Head: Normocephalic and atraumatic.  Eyes: EOM are normal. Pupils are equal, round, and reactive to light.  Neck: Normal range of motion. Neck supple.       No meningismus  Cardiovascular: Normal rate, normal heart sounds and intact distal pulses.   Pulmonary/Chest: Effort normal and breath sounds normal.  Abdominal: Bowel sounds are normal. He exhibits no distension. There is no tenderness.  Genitourinary:       No inguinal lymphadenopathy, no penile tenderness or sores, no penile discharge. L testicle is normal, R testicle is swollen and tender to palpation  Musculoskeletal: Normal range of motion. He exhibits no edema and no tenderness.  Neurological: He is alert and oriented to person, place, and time. He has normal strength. No cranial nerve deficit or sensory deficit.  Skin: Skin is warm and dry. Rash (rash on palms and soles) noted.  Psychiatric:  He has a normal mood and affect.    ED Course  Procedures (including critical care time)  Labs Reviewed  CBC WITH DIFFERENTIAL - Abnormal; Notable for the following:    WBC 13.1 (*)     Neutrophils Relative 81 (*)     Neutro Abs 10.4 (*)     Lymphocytes Relative 8 (*)     Monocytes Absolute 1.3 (*)     All other components within normal limits  COMPREHENSIVE METABOLIC PANEL - Abnormal; Notable for the following:    Chloride 95 (*)     Glucose, Bld 126 (*)     AST 57 (*)     ALT 124 (*)     Alkaline Phosphatase 171 (*)     GFR calc non Af Amer 76 (*)     GFR calc Af Amer 88 (*)     All other components within normal limits  URINALYSIS, ROUTINE W  REFLEX MICROSCOPIC  HIV ANTIBODY (ROUTINE TESTING)  GC/CHLAMYDIA PROBE AMP, URINE  ROCKY MTN SPOTTED FVR AB, IGG-BLOOD  ROCKY MTN SPOTTED FVR AB, IGM-BLOOD  LYME DISEASE DNA BY PCR(BORRELIA BURG)   No results found.   No diagnosis found.    MDM  Pt with fever, rash, testicular pain and tachycardia. No clear etiology, had a long discussion with ID, who agrees with workup here. Will admit for further evaluation, IVF.         Aliegha Paullin B. Bernette Mayers, MD 11/28/11 1038

## 2011-11-27 NOTE — H&P (Addendum)
PCP:   [name], MD   Chief Complaint:  Rash on hands and feet  HPI:  This is a 31 year old gentleman who states that on Thursday he developed a rash on the hands and feet. He had a sore throat, he went to urgent care Center where a rapid strep test was done and negative. He was prescribed amoxicillin. He did not get any better he returned to [facility] the ER 2 days ago, at that time he had no sore throat   But the rash on his hands and feet were still present. He was thought to have a [facility] spotted fever and was started on doxycycline. Since being on the doxycycline the rash on the hand and feet have begun to dry and peel. He had a slight rash on the left brow this is also drying and peeling. This morning he woke up with severe abdominal pain. When he went to urinate he was painful urination, He states not burning. He states his right testicle felt sore and he had right flank pain. He had a single episode of nausea this morning and here in the ER he had temperature of 102. He denies any chills. He denies having unprotected sex. The hospitalist service has been called to evaluate. The ER physician also spoke with infectious disease Dr. Lodema Hong. History provided by the patient. No hematuria.   Review of Systems:  The patient denies anorexia, fever, weight loss,, vision loss, decreased hearing, hoarseness, chest pain, syncope, dyspnea on exertion, peripheral edema, balance deficits, hemoptysis, melena, hematochezia, severe indigestion/heartburn, hematuria, incontinence, genital sores, muscle weakness, suspicious skin lesions, transient blindness, difficulty walking, depression, unusual weight change, abnormal bleeding, enlarged lymph nodes, angioedema, and breast masses.  Past Medical History: Past Medical History  Diagnosis Date  . Obesity   . Hypertension    Past Surgical History  Procedure Date  . Lithrostripsy 2003  . Broke right femur 1997  . Broke right hand 2003     Medications: Prior to Admission medications   Medication Sig Start Date End Date Taking? Authorizing Provider  amoxicillin (AMOXIL) 500 MG capsule Take 1,000 mg by mouth 2 (two) times daily. For 8 days; Start date 11/22/11   Yes Historical Provider, MD  doxycycline (VIBRAMYCIN) 100 MG capsule Take 100 mg by mouth 2 (two) times daily. For 14 days; Start date 11/22/11 11/25/11  Yes Hurman Horn, MD  hyoscyamine (LEVSIN SL) 0.125 MG SL tablet Place 0.125 mg under the tongue every 4 (four) hours as needed. For cramping 11/23/11  Yes Hayden Rasmussen, NP  Multiple Vitamin (MULTIVITAMIN WITH MINERALS) TABS Take 1 tablet by mouth daily.   Yes Historical Provider, MD  oxyCODONE-acetaminophen (PERCOCET/ROXICET) 5-325 MG per tablet Take 2 tablets by mouth every 4 (four) hours as needed. For pain 11/25/11  Yes Hurman Horn, MD  triamterene-hydrochlorothiazide (MAXZIDE) 75-50 MG per tablet Take 1 tablet by mouth daily. 03/14/11 03/13/12 Yes Kerri Perches, MD    Allergies:  No Known Allergies  Social History:  reports that he has never smoked. He does not have any smokeless tobacco history on file. He reports that he does not drink alcohol or use illicit drugs.  Family History: Family History  Problem Relation Age of Onset  . Diabetes Mother   . Diabetes Father     Physical Exam: Filed Vitals:   11/27/11 1610 11/27/11 1623 11/27/11 1848 11/27/11 1944  BP: 109/47  129/71   Pulse: 110  113   Temp: 102.8  F (39.3 C) 101.8 F (38.8 C)  98.6 F (37 C)  TempSrc: Oral Oral  Oral  Resp: 18  14   SpO2: 100%  98%     General:  Alert and oriented times three, well developed and nourished, no acute distress Eyes: PERRLA, pink conjunctiva, no scleral icterus ENT: Moist oral mucosa, neck supple, no thyromegaly Lungs: clear to ascultation, no wheeze, no crackles, no use of accessory muscles Cardiovascular: regular rate and rhythm, no regurgitation, no gallops, no murmurs. No carotid bruits, no  JVD Abdomen: soft, positive BS, non-tender, non-distended, no organomegaly, not an acute abdomen GU: tender testicles Neuro: CN II - XII grossly intact, sensation intact Musculoskeletal: strength 5/5 all extremities, no clubbing, cyanosis or edema Skin: no rash, no subcutaneous crepitation, no decubitus, macular rash on palms of hands and soles of feet. Mild rash and on face and on the left brow. Psych: appropriate patient   Labs on Admission:   Basename 11/27/11 1250  NA 137  K 3.6  CL 95*  CO2 30  GLUCOSE 126*  BUN 14  CREATININE 1.25  CALCIUM 10.2  MG --  PHOS --    Basename 11/27/11 1250  AST 57*  ALT 124*  ALKPHOS 171*  BILITOT 1.1  PROT 8.0  ALBUMIN 4.1   No results found for this basename: LIPASE:2,AMYLASE:2 in the last 72 hours  Basename 11/27/11 1250  WBC 13.1*  NEUTROABS 10.4*  HGB 15.5  HCT 46.0  MCV 84.2  PLT 283   No results found for this basename: CKTOTAL:3,CKMB:3,CKMBINDEX:3,TROPONINI:3 in the last 72 hours No components found with this basename: POCBNP:3 No results found for this basename: DDIMER:2 in the last 72 hours No results found for this basename: HGBA1C:2 in the last 72 hours No results found for this basename: CHOL:2,HDL:2,LDLCALC:2,TRIG:2,CHOLHDL:2,LDLDIRECT:2 in the last 72 hours No results found for this basename: TSH,T4TOTAL,FREET3,T3FREE,THYROIDAB in the last 72 hours No results found for this basename: VITAMINB12:2,FOLATE:2,FERRITIN:2,TIBC:2,IRON:2,RETICCTPCT:2 in the last 72 hours  Micro Results: Results for DAMARRIUS, KULAS (MRN [id number]) as of 11/27/2011 21:07  Ref. Range 11/27/2011 18:37  Color, Urine Latest Range: YELLOW  AMBER (A)  APPearance Latest Range: CLEAR  CLEAR  Specific Gravity, Urine Latest Range: 1.005-1.030  1.021  pH Latest Range: 5.0-8.0  6.0  Glucose Latest Range: NEGATIVE mg/dL NEGATIVE  Bilirubin Urine Latest Range: NEGATIVE  SMALL (A)  Ketones, ur Latest Range: NEGATIVE mg/dL 15 (A)  Protein Latest  Range: NEGATIVE mg/dL NEGATIVE  Urobilinogen, UA Latest Range: 0.0-1.0 mg/dL 1.0  Nitrite Latest Range: NEGATIVE  NEGATIVE  Leukocytes, UA Latest Range: NEGATIVE  NEGATIVE  Hgb urine dipstick Latest Range: NEGATIVE  NEGATIVE     Radiological Exams on Admission: Dg Chest 2 View  11/27/2011  *RADIOLOGY REPORT*  Clinical Data: Fever and rash  CHEST - 2 VIEW  Comparison: 01/10/2011  Findings: Negative for pneumonia.  Lungs are clear without infiltrate or effusion.  Pulmonary vascularity is normal.  Heart size is normal.  Question mild fracture of the mid thoracic spine which was not present previously.  IMPRESSION: No active cardiopulmonary disease.  Question mild fracture of the mid thoracic spine of indeterminate age.   Original Report Authenticated By: Camelia Phenes, M.D.    US Scrotum  11/27/2011  *RADIOLOGY REPORT*  Clinical Data:  Right scrotal pain  SCROTAL ULTRASOUND DOPPLER ULTRASOUND OF THE TESTICLES  Technique: Complete ultrasound examination of the testicles, epididymis, and other scrotal structures was performed.  Color and spectral Doppler ultrasound were also utilized to  evaluate blood flow to the testicles.  Comparison:  none  Findings:  Right testis:  3.4 x 2.2 x 2.1 cm.  No mass or edema.  Normal blood flow by Doppler.  Arterial and venous wave forms are identified.  Left testis:  3.6 x 2.1 x 2.2 cm.  Negative for mass.  Normal blood flow by Doppler.  Right epididymis:  Increased blood flow around the right epididymis.  This may be epididymitis however could also be due to varicocele.  There are some prominent tubular structures in the area which could be dilated veins.  Left epididymis:  Normal in size and appearance.  Hydrocele:  present/absent.  Varicocele:  Possible varicocele versus epididymitis on the right.  Pulsed Doppler interrogation of both testes demonstrates low resistance flow bilaterally.  IMPRESSION: Negative for testicular torsion.  Increased vascularity above the right  testicle may represent varicocele versus epididymitis.   Original Report Authenticated By: Camelia Phenes, M.D.    [place] Art/ven Flow Abd Pelv Doppler  11/27/2011  *RADIOLOGY REPORT*  Clinical Data:  Right scrotal pain  SCROTAL ULTRASOUND DOPPLER ULTRASOUND OF THE TESTICLES  Technique: Complete ultrasound examination of the testicles, epididymis, and other scrotal structures was performed.  Color and spectral Doppler ultrasound were also utilized to evaluate blood flow to the testicles.  Comparison:  none  Findings:  Right testis:  3.4 x 2.2 x 2.1 cm.  No mass or edema.  Normal blood flow by Doppler.  Arterial and venous wave forms are identified.  Left testis:  3.6 x 2.1 x 2.2 cm.  Negative for mass.  Normal blood flow by Doppler.  Right epididymis:  Increased blood flow around the right epididymis.  This may be epididymitis however could also be due to varicocele.  There are some prominent tubular structures in the area which could be dilated veins.  Left epididymis:  Normal in size and appearance.  Hydrocele:  present/absent.  Varicocele:  Possible varicocele versus epididymitis on the right.  Pulsed Doppler interrogation of both testes demonstrates low resistance flow bilaterally.  IMPRESSION: Negative for testicular torsion.  Increased vascularity above the right testicle may represent varicocele versus epididymitis.   Original Report Authenticated By: Camelia Phenes, M.D.     Assessment/Plan Present on Admission:   rash on hands and feet/fever/headache Admit to 23 for observation Infectious disease was contacted by ER physician, they recommended ordering labs for [facility] spotted fever, Lyme disease, HIV, GC and Chlamydia. Blood cultures were also collected The patient does state that the doxycycline has been drying the rash. I'm unclear of the doxycycline could have been causing abdominal pain, I discussed with the patient restarting doxycycline and monitoring in the hospital. He is  agreeable. Headache resolved in. Patient without any meningismus symptoms Testicular pain Question possible epididymitis. Patient denies any unprotected sex. He could also have for bacterial prostatitis.GC and Chlamydia will be respectively treated with Rocephin given in the ER and continue doxycycline. Levaquin 500 mg daily for any possible bacterial prostatitis   infectious disease Dr. [name] states she or her partner are available for consult in a.m. if needed   mild transaminitis Likely due to the doxycycline. Repeat CMP in a.m., monitor trend    full code DVT prophylaxis   Saadia Dewitt 11/27/2011, 8:58 PM

## 2011-11-28 ENCOUNTER — Observation Stay (HOSPITAL_COMMUNITY): Payer: Managed Care, Other (non HMO)

## 2011-11-28 LAB — URINE CULTURE: Culture: NO GROWTH

## 2011-11-28 LAB — RAPID URINE DRUG SCREEN, HOSP PERFORMED
Opiates: NOT DETECTED
Tetrahydrocannabinol: NOT DETECTED

## 2011-11-28 LAB — CBC
Platelets: 259 10*3/uL (ref 150–400)
RBC: 4.91 MIL/uL (ref 4.22–5.81)
WBC: 9.1 10*3/uL (ref 4.0–10.5)

## 2011-11-28 LAB — COMPREHENSIVE METABOLIC PANEL
Albumin: 3.3 g/dL — ABNORMAL LOW (ref 3.5–5.2)
BUN: 14 mg/dL (ref 6–23)
Chloride: 99 mEq/L (ref 96–112)
Creatinine, Ser: 1.19 mg/dL (ref 0.50–1.35)
Total Bilirubin: 0.6 mg/dL (ref 0.3–1.2)

## 2011-11-28 LAB — LIPASE, BLOOD
Lipase: 29 U/L (ref 11–59)
Lipase: 29 U/L (ref 11–59)

## 2011-11-28 LAB — B. BURGDORFI ANTIBODIES: B burgdorferi Ab IgG+IgM: 0.33 {ISR}

## 2011-11-28 LAB — GC/CHLAMYDIA PROBE AMP, URINE: GC Probe Amp, Urine: NEGATIVE

## 2011-11-28 LAB — RPR
RPR Ser Ql: NONREACTIVE
RPR Ser Ql: NONREACTIVE

## 2011-11-28 MED ORDER — IOHEXOL 300 MG/ML  SOLN
100.0000 mL | Freq: Once | INTRAMUSCULAR | Status: AC | PRN
  Administered 2011-11-28: 100 mL via INTRAVENOUS

## 2011-11-28 MED ORDER — POTASSIUM CHLORIDE CRYS ER 20 MEQ PO TBCR
40.0000 meq | EXTENDED_RELEASE_TABLET | Freq: Once | ORAL | Status: AC
  Administered 2011-11-28: 40 meq via ORAL
  Filled 2011-11-28: qty 2

## 2011-11-28 MED ORDER — SACCHAROMYCES BOULARDII 250 MG PO CAPS: 250.0000 mg | ORAL_CAPSULE | Freq: Two times a day (BID) | ORAL | Status: AC

## 2011-11-28 MED ORDER — MINOCYCLINE HCL 100 MG PO CAPS: 100.0000 mg | ORAL_CAPSULE | Freq: Two times a day (BID) | ORAL | Status: DC

## 2011-11-28 MED ORDER — IOHEXOL 300 MG/ML  SOLN
20.0000 mL | INTRAMUSCULAR | Status: AC
  Administered 2011-11-28 (×2): 20 mL via ORAL

## 2011-11-28 MED ORDER — HYDROXYZINE HCL 50 MG/ML IM SOLN
50.0000 mg | Freq: Four times a day (QID) | INTRAMUSCULAR | Status: DC | PRN
  Administered 2011-11-28: 50 mg via INTRAMUSCULAR
  Filled 2011-11-28 (×2): qty 1

## 2011-11-28 MED ORDER — DOXYCYCLINE HYCLATE 100 MG IV SOLR
100.0000 mg | Freq: Two times a day (BID) | INTRAVENOUS | Status: DC
  Administered 2011-11-28 – 2011-11-30 (×4): 100 mg via INTRAVENOUS
  Filled 2011-11-28 (×5): qty 100

## 2011-11-28 MED ORDER — CEFTRIAXONE SODIUM 1 G IJ SOLR
1.0000 g | Freq: Once | INTRAMUSCULAR | Status: AC
  Administered 2011-11-28: 1 g via INTRAVENOUS
  Filled 2011-11-28 (×2): qty 10

## 2011-11-28 MED ORDER — DOXYCYCLINE HYCLATE 100 MG PO TABS
100.0000 mg | ORAL_TABLET | Freq: Two times a day (BID) | ORAL | Status: DC
  Administered 2011-11-28: 100 mg via ORAL
  Filled 2011-11-28 (×2): qty 1

## 2011-11-28 MED ORDER — DOXYCYCLINE HYCLATE 100 MG PO TABS: 100.0000 mg | ORAL_TABLET | Freq: Two times a day (BID) | ORAL | Status: DC

## 2011-11-28 MED ORDER — HYDROXYZINE HCL 10 MG PO TABS: 10.0000 mg | ORAL_TABLET | Freq: Three times a day (TID) | ORAL | Status: DC | PRN

## 2011-11-28 MED ORDER — SACCHAROMYCES BOULARDII 250 MG PO CAPS: 250.0000 mg | ORAL_CAPSULE | Freq: Two times a day (BID) | ORAL | Status: DC

## 2011-11-28 NOTE — Consult Note (Signed)
    [facility]     Reason for Consult:fever, testicular pain, rash    Referring Physician: Dr. Joneen Roach  Active Problems:  * No active hospital problems. *       . acetaminophen  650 mg Oral Once  . cefTRIAXone (ROCEPHIN)  IV  1 g Intravenous Once  . cefTRIAXone (ROCEPHIN) IM  250 mg Intramuscular Once  . doxycycline  100 mg Oral Q12H  . enoxaparin (LOVENOX) injection  40 mg Subcutaneous Q24H  .  HYDROmorphone (DILAUDID) injection  1 mg Intravenous Once  . ketorolac  30 mg Intravenous Once  . lidocaine      . ondansetron  4 mg Intravenous Once  . sodium chloride  1,000 mL Intravenous Once  . triamterene-hydrochlorothiazide  1 tablet Oral Daily  . DISCONTD: chloramphenicol (CHLOROMYCETIN) IV  50 mg/kg/day Intravenous Q6H    Recommendations: Doxycycline for presumed RMSF  I will have lab confirm negative RPR  Assessment: His predominate symptoms have been sore throat, testicular pain, rash and headache.   Unusual constellation of symptoms.  No obvious etiology with negative RPR/GC/chlamydia, no wBCs in urine.  Ultrasound with some swelling but no obvious infection.  Continue with the doxycycline for possible RMSF, false negative RPR.    Antibiotics: Ceftriaxone x 1 Doxycycline 10/6 -   HPI: Devon Macdonald is a 31 y.o. male with a history of hypertension who initially presented to UC on 10/4 with sore throat and treated with amoxicillin for presumed strep throat.  Patient also had rash on hands and feet and returned to ED 10/6.  Also with complaint of abdominal cramping.  Additionally, he noted testicular pain and swelling.  Denies recent unprotected sex.  + fever.  No n/v.  No sick contacts.     Review of Systems: Pertinent items are noted in HPI.  Past Medical History  Diagnosis Date  . Obesity   . Hypertension     History  Substance Use Topics  . Smoking status: Never Smoker   . Smokeless tobacco: Not on file  . Alcohol Use: No     Family History  Problem Relation Age of Onset  . Diabetes Mother   . Diabetes Father    No Known Allergies  OBJECTIVE: Blood pressure 109/67, pulse 69, temperature 98.6 F (37 C), temperature source Oral, resp. rate 16, height 5' 5.5" (1.664 m), weight 168 lb (76.204 kg), SpO2 100.00%. General: AAO x 3 Skin: rash on palms and soles, macular Lungs: CTA B Cor: RRR Abdomen: soft, ntnd  Microbiology: No results found for this or any previous visit (from the past 240 hour(s)).  Staci Righter, MD [facility] [phone] pager  [phone] cell 11/28/2011, 10:34 AM

## 2011-11-28 NOTE — Discharge Summary (Signed)
Physician Discharge Summary  Devon Macdonald MRN: [id number] DOB/AGE: 03/29/1980 31 y.o.  PCP: [name], MD   Admit date: 11/27/2011 Discharge date: 11/28/2011  Discharge Diagnoses:  Maculopapular rash on hands and feet Epididymitis      Medication List     As of 11/28/2011  9:10 AM    STOP taking these medications         amoxicillin 500 MG capsule   Commonly known as: AMOXIL      TAKE these medications         doxycycline 100 MG capsule   Commonly known as: VIBRAMYCIN   Take 100 mg by mouth 2 (two) times daily. For 14 days; Start date 11/22/11      doxycycline 100 MG tablet   Commonly known as: VIBRA-TABS   Take 1 tablet (100 mg total) by mouth every 12 (twelve) hours.      hyoscyamine 0.125 MG SL tablet   Commonly known as: LEVSIN SL   Place 0.125 mg under the tongue every 4 (four) hours as needed. For cramping      multivitamin with minerals Tabs   Take 1 tablet by mouth daily.      oxyCODONE-acetaminophen 5-325 MG per tablet   Commonly known as: PERCOCET/ROXICET   Take 2 tablets by mouth every 4 (four) hours as needed. For pain      triamterene-hydrochlorothiazide 75-50 MG per tablet   Commonly known as: MAXZIDE   Take 1 tablet by mouth daily.        Discharge Condition: Stable  Disposition: 01-Home or Self Care   Consults:    Significant Diagnostic Studies: Dg Chest 2 View  11/27/2011  *RADIOLOGY REPORT*  Clinical Data: Fever and rash  CHEST - 2 VIEW  Comparison: 01/10/2011  Findings: Negative for pneumonia.  Lungs are clear without infiltrate or effusion.  Pulmonary vascularity is normal.  Heart size is normal.  Question mild fracture of the mid thoracic spine which was not present previously.  IMPRESSION: No active cardiopulmonary disease.  Question mild fracture of the mid thoracic spine of indeterminate age.   Original Report Authenticated By: Camelia Phenes, M.D.    US Scrotum  11/27/2011  *RADIOLOGY REPORT*  Clinical Data:   Right scrotal pain  SCROTAL ULTRASOUND DOPPLER ULTRASOUND OF THE TESTICLES  Technique: Complete ultrasound examination of the testicles, epididymis, and other scrotal structures was performed.  Color and spectral Doppler ultrasound were also utilized to evaluate blood flow to the testicles.  Comparison:  none  Findings:  Right testis:  3.4 x 2.2 x 2.1 cm.  No mass or edema.  Normal blood flow by Doppler.  Arterial and venous wave forms are identified.  Left testis:  3.6 x 2.1 x 2.2 cm.  Negative for mass.  Normal blood flow by Doppler.  Right epididymis:  Increased blood flow around the right epididymis.  This may be epididymitis however could also be due to varicocele.  There are some prominent tubular structures in the area which could be dilated veins.  Left epididymis:  Normal in size and appearance.  Hydrocele:  present/absent.  Varicocele:  Possible varicocele versus epididymitis on the right.  Pulsed Doppler interrogation of both testes demonstrates low resistance flow bilaterally.  IMPRESSION: Negative for testicular torsion.  Increased vascularity above the right testicle may represent varicocele versus epididymitis.   Original Report Authenticated By: Camelia Phenes, M.D.    [place] Art/ven Flow Abd Pelv Doppler  11/27/2011  *RADIOLOGY REPORT*  Clinical  Data:  Right scrotal pain  SCROTAL ULTRASOUND DOPPLER ULTRASOUND OF THE TESTICLES  Technique: Complete ultrasound examination of the testicles, epididymis, and other scrotal structures was performed.  Color and spectral Doppler ultrasound were also utilized to evaluate blood flow to the testicles.  Comparison:  none  Findings:  Right testis:  3.4 x 2.2 x 2.1 cm.  No mass or edema.  Normal blood flow by Doppler.  Arterial and venous wave forms are identified.  Left testis:  3.6 x 2.1 x 2.2 cm.  Negative for mass.  Normal blood flow by Doppler.  Right epididymis:  Increased blood flow around the right epididymis.  This may be epididymitis however could also be  due to varicocele.  There are some prominent tubular structures in the area which could be dilated veins.  Left epididymis:  Normal in size and appearance.  Hydrocele:  present/absent.  Varicocele:  Possible varicocele versus epididymitis on the right.  Pulsed Doppler interrogation of both testes demonstrates low resistance flow bilaterally.  IMPRESSION: Negative for testicular torsion.  Increased vascularity above the right testicle may represent varicocele versus epididymitis.   Original Report Authenticated By: Camelia Phenes, M.D.   Juliann Pares.    Microbiology: No results found for this or any previous visit (from the past 240 hour(s)).   Labs: Results for orders placed during the hospital encounter of 11/27/11 (from the past 48 hour(s))  CBC WITH DIFFERENTIAL     Status: Abnormal   Collection Time   11/27/11 12:50 PM      Component Value Range Comment   WBC 13.1 (*) 4.0 - 10.5 K/uL    RBC 5.46  4.22 - 5.81 MIL/uL    Hemoglobin 15.5  13.0 - 17.0 g/dL    HCT 16.1  09.6 - 04.5 %    MCV 84.2  78.0 - 100.0 fL    MCH 28.4  26.0 - 34.0 pg    MCHC 33.7  30.0 - 36.0 g/dL    RDW 40.9  81.1 - 91.4 %    Platelets 283  150 - 400 K/uL    Neutrophils Relative 81 (*) 43 - 77 %    Neutro Abs 10.4 (*) 1.7 - 7.7 K/uL    Lymphocytes Relative 8 (*) 12 - 46 %    Lymphs Abs 1.0  0.7 - 4.0 K/uL    Monocytes Relative 10  3 - 12 %    Monocytes Absolute 1.3 (*) 0.1 - 1.0 K/uL    Eosinophils Relative 0  0 - 5 %    Eosinophils Absolute 0.0  0.0 - 0.7 K/uL    Basophils Relative 1  0 - 1 %    Basophils Absolute 0.1  0.0 - 0.1 K/uL   COMPREHENSIVE METABOLIC PANEL     Status: Abnormal   Collection Time   11/27/11 12:50 PM      Component Value Range Comment   Sodium 137  135 - 145 mEq/L    Potassium 3.6  3.5 - 5.1 mEq/L    Chloride 95 (*) 96 - 112 mEq/L    CO2 30  19 - 32 mEq/L    Glucose, Bld 126 (*) 70 - 99 mg/dL    BUN 14  6 - 23 mg/dL    Creatinine, Ser 7.82  0.50 - 1.35 mg/dL    Calcium 95.6  8.4 - 10.5  mg/dL    Total Protein 8.0  6.0 - 8.3 g/dL    Albumin 4.1  3.5 - 5.2 g/dL  AST 57 (*) 0 - 37 U/L    ALT 124 (*) 0 - 53 U/L    Alkaline Phosphatase 171 (*) 39 - 117 U/L    Total Bilirubin 1.1  0.3 - 1.2 mg/dL    GFR calc non Af Amer 76 (*) >90 mL/min    GFR calc Af Amer 88 (*) >90 mL/min   HIV ANTIBODY (ROUTINE TESTING)     Status: Normal   Collection Time   11/27/11  4:57 PM      Component Value Range Comment   HIV NON REACTIVE  NON REACTIVE   LACTIC ACID, PLASMA     Status: Normal   Collection Time   11/27/11  5:04 PM      Component Value Range Comment   Lactic Acid, Venous 2.2  0.5 - 2.2 mmol/L   URINALYSIS, ROUTINE W REFLEX MICROSCOPIC     Status: Abnormal   Collection Time   11/27/11  6:37 PM      Component Value Range Comment   Color, Urine AMBER (*) YELLOW BIOCHEMICALS MAY BE AFFECTED BY COLOR   APPearance CLEAR  CLEAR    Specific Gravity, Urine 1.021  1.005 - 1.030    pH 6.0  5.0 - 8.0    Glucose, UA NEGATIVE  NEGATIVE mg/dL    Hgb urine dipstick NEGATIVE  NEGATIVE    Bilirubin Urine SMALL (*) NEGATIVE    Ketones, ur 15 (*) NEGATIVE mg/dL    Protein, ur NEGATIVE  NEGATIVE mg/dL    Urobilinogen, UA 1.0  0.0 - 1.0 mg/dL    Nitrite NEGATIVE  NEGATIVE    Leukocytes, UA NEGATIVE  NEGATIVE MICROSCOPIC NOT DONE ON URINES WITH NEGATIVE PROTEIN, BLOOD, LEUKOCYTES, NITRITE, OR GLUCOSE <1000 mg/dL.  GC/CHLAMYDIA PROBE AMP, URINE     Status: Normal   Collection Time   11/27/11  6:37 PM      Component Value Range Comment   GC Probe Amp, Urine NEGATIVE  NEGATIVE    Chlamydia, Swab/Urine, PCR NEGATIVE  NEGATIVE   RPR     Status: Normal   Collection Time   11/27/11  7:34 PM      Component Value Range Comment   RPR NON REACTIVE  NON REACTIVE   CBC     Status: Normal   Collection Time   11/28/11  6:45 AM      Component Value Range Comment   WBC 9.1  4.0 - 10.5 K/uL    RBC 4.91  4.22 - 5.81 MIL/uL    Hemoglobin 13.6  13.0 - 17.0 g/dL    HCT 16.1  09.6 - 04.5 %    MCV 84.5  78.0  - 100.0 fL    MCH 27.7  26.0 - 34.0 pg    MCHC 32.8  30.0 - 36.0 g/dL    RDW 40.9  81.1 - 91.4 %    Platelets 259  150 - 400 K/uL      HPI :31 year old gentleman who states that on Thursday he developed a rash on the hands and feet. He had a sore throat, he went to urgent care Center where a rapid strep test was done and negative. He was prescribed amoxicillin. He did not get any better he returned to [facility] the ER 2 days ago, at that time he had no sore throat  But the rash on his hands and feet were still present. He was thought to have a [facility] spotted fever and was started on doxycycline. Since  being on the doxycycline the rash on the hand and feet have begun to dry and peel. He had a slight rash on the left brow this is also drying and peeling. This morning he woke up with severe abdominal pain. When he went to urinate he was painful urination, He states not burning. He states his right testicle felt sore and he had right flank pain. He had a single episode of nausea this morning and here in the ER he had temperature of 102. He denies any chills. He denies having unprotected sex. The hospitalist service has been called to evaluate. The ER physician also spoke with infectious disease Dr. Lodema Hong. History provided by the patient. No hematuria   HOSPITAL COURSE:  #1 probable epididymitis Patient was given one dose of Rocephin, 1 g, IV He has also been provided with a prescription for doxycycline 100 mg twice a day, for a total of 10 days Urine analysis was negative GC and Chlamydia probe were negative   #2 maculopapular rash HIV, RPR nonreactive GC and Chlamydia were negative Streptococcal group A screen was negative Blood cultures show no growth so far Lyme  and [facility] spotted fever panel is pending Patient is hemodynamically stable with no complaints Patient will be discharged today and followup with infectious disease after completion of his  doxycycline      Discharge Exam:  Blood pressure 109/67, pulse 69, temperature 98.6 F (37 C), temperature source Oral, resp. rate 16, height 5' 5.5" (1.664 m), weight 76.204 kg (168 lb), SpO2 100.00%.   General: Alert and oriented times three, well developed and nourished, no acute distress  Eyes: PERRLA, pink conjunctiva, no scleral icterus  ENT: Moist oral mucosa, neck supple, no thyromegaly  Lungs: clear to ascultation, no wheeze, no crackles, no use of accessory muscles  Cardiovascular: regular rate and rhythm, no regurgitation, no gallops, no murmurs. No carotid bruits, no JVD  Abdomen: soft, positive BS, non-tender, non-distended, no organomegaly, not an acute abdomen  GU: tender testicles  Neuro: CN II - XII grossly intact, sensation intact  Musculoskeletal: strength 5/5 all extremities, no clubbing, cyanosis or edema  Skin: no rash, no subcutaneous crepitation, no decubitus, macular rash on palms of hands and soles of feet. Mild rash and on face and on the left brow.       Discharge Orders    Future Appointments: Provider: Department: Dept Phone: Center:   01/09/2012 9:15 AM Kerri Perches, MD [facility] [phone] [facility]        Signed: Richarda Overlie 11/28/2011, 9:10 AM

## 2011-11-28 NOTE — Progress Notes (Addendum)
Cancel discharge as patient complaining of severe abdominal pain, CT, LFT'S , LIPASE, UA negative  Patient claims he has  10/10 pain associated with doxycycline Dr Luciana Axe suggested to switch to minocycline as patient is refusing doxycycline , but this would not cure [facility] spotted fever Patient now agreeable to be on IV doxycycline now  Off note is that RN did not notify me, that the patient had a reaction to doxycycline around noon, despite me having multiple conversations with the R.N. for the last 3 hours The girlfriend expressed concern regarding this, during my second visit with the patient today

## 2011-11-29 ENCOUNTER — Telehealth: Payer: Self-pay | Admitting: Family Medicine

## 2011-11-29 DIAGNOSIS — R21 Rash and other nonspecific skin eruption: Secondary | ICD-10-CM

## 2011-11-29 DIAGNOSIS — N451 Epididymitis: Secondary | ICD-10-CM

## 2011-11-29 DIAGNOSIS — R109 Unspecified abdominal pain: Secondary | ICD-10-CM

## 2011-11-29 LAB — COMPREHENSIVE METABOLIC PANEL
ALT: 73 U/L — ABNORMAL HIGH (ref 0–53)
AST: 35 U/L (ref 0–37)
Alkaline Phosphatase: 141 U/L — ABNORMAL HIGH (ref 39–117)
CO2: 26 mEq/L (ref 19–32)
Calcium: 9.6 mg/dL (ref 8.4–10.5)
Chloride: 98 mEq/L (ref 96–112)
GFR calc non Af Amer: 82 mL/min — ABNORMAL LOW (ref >90)
Potassium: 4 mEq/L (ref 3.5–5.1)
Sodium: 134 mEq/L — ABNORMAL LOW (ref 135–145)
Total Bilirubin: 0.3 mg/dL (ref 0.3–1.2)

## 2011-11-29 LAB — ANA: Anti Nuclear Antibody(ANA): NEGATIVE

## 2011-11-29 MED ORDER — MORPHINE SULFATE 2 MG/ML IJ SOLN: 2.0000 mg | Freq: Four times a day (QID) | INTRAMUSCULAR | Status: DC | PRN

## 2011-11-29 MED ORDER — BISACODYL 10 MG RE SUPP
10.0000 mg | Freq: Once | RECTAL | Status: DC
  Filled 2011-11-29: qty 1

## 2011-11-29 MED ORDER — MORPHINE SULFATE 2 MG/ML IJ SOLN
2.0000 mg | INTRAMUSCULAR | Status: DC | PRN
  Administered 2011-11-29: 2 mg via INTRAVENOUS
  Filled 2011-11-29: qty 1

## 2011-11-29 MED ORDER — BISACODYL 10 MG RE SUPP: 10.0000 mg | Freq: Once | RECTAL | Status: DC

## 2011-11-29 MED ORDER — MORPHINE SULFATE 2 MG/ML IJ SOLN: 2.0000 mg | Freq: Two times a day (BID) | INTRAMUSCULAR | Status: DC | PRN

## 2011-11-29 MED ORDER — LACTULOSE 10 GM/15ML PO SOLN
30.0000 g | Freq: Once | ORAL | Status: AC
  Administered 2011-11-29: 30 g via ORAL
  Filled 2011-11-29: qty 45

## 2011-11-29 NOTE — Progress Notes (Signed)
Internal medicine MD completed DC orders for pt to go home.  Pt very concerned about going home with abd pain despite MD's explanation for cause.  MD requested to hold off DC until seen by infectious disease MD.  Dr. Luciana Axe ordered abd [place] to r/o gallstones, but pt had not been NPO, therefore pushing test until first thing tomorrow morning (10/11) to be NPO after midnight. [address]

## 2011-11-29 NOTE — Discharge Summary (Addendum)
Physician Discharge Summary  Devon Macdonald MRN: [id number] DOB/AGE: [birth year] 30 y.o.  PCP: [name], MD   Admit date: 11/27/2011 Discharge date: 11/29/2011  Discharge Diagnoses:    Maculopapular rash  Abdominal pain in male patient  Epididymitis     Medication List     As of 11/29/2011  8:49 AM    STOP taking these medications         amoxicillin 500 MG capsule   Commonly known as: AMOXIL      TAKE these medications         doxycycline 100 MG capsule   Commonly known as: VIBRAMYCIN   Take 100 mg by mouth 2 (two) times daily. For 14 days; Start date 11/22/11      doxycycline 100 MG tablet   Commonly known as: VIBRA-TABS   Take 1 tablet (100 mg total) by mouth every 12 (twelve) hours.      hydrOXYzine 10 MG tablet   Commonly known as: ATARAX/VISTARIL   Take 1 tablet (10 mg total) by mouth 3 (three) times daily as needed for itching.      hyoscyamine 0.125 MG SL tablet   Commonly known as: LEVSIN SL   Place 0.125 mg under the tongue every 4 (four) hours as needed. For cramping      multivitamin with minerals Tabs   Take 1 tablet by mouth daily.      oxyCODONE-acetaminophen 5-325 MG per tablet   Commonly known as: PERCOCET/ROXICET   Take 2 tablets by mouth every 4 (four) hours as needed. For pain      saccharomyces boulardii 250 MG capsule   Commonly known as: FLORASTOR   Take 1 capsule (250 mg total) by mouth 2 (two) times daily.      saccharomyces boulardii 250 MG capsule   Commonly known as: FLORASTOR   Take 1 capsule (250 mg total) by mouth 2 (two) times daily.      triamterene-hydrochlorothiazide 75-50 MG per tablet   Commonly known as: MAXZIDE   Take 1 tablet by mouth daily.        Discharge Condition: *Stable  Disposition: 01-Home or Self Care   Consults:  #1 infectious disease  Significant Diagnostic Studies: Dg Chest 2 View  11/27/2011  *RADIOLOGY REPORT*  Clinical Data: Fever and rash  CHEST - 2 VIEW  Comparison:  01/10/2011  Findings: Negative for pneumonia.  Lungs are clear without infiltrate or effusion.  Pulmonary vascularity is normal.  Heart size is normal.  Question mild fracture of the mid thoracic spine which was not present previously.  IMPRESSION: No active cardiopulmonary disease.  Question mild fracture of the mid thoracic spine of indeterminate age.   Original Report Authenticated By: Camelia Phenes, M.D.    US Scrotum  11/27/2011  *RADIOLOGY REPORT*  Clinical Data:  Right scrotal pain  SCROTAL ULTRASOUND DOPPLER ULTRASOUND OF THE TESTICLES  Technique: Complete ultrasound examination of the testicles, epididymis, and other scrotal structures was performed.  Color and spectral Doppler ultrasound were also utilized to evaluate blood flow to the testicles.  Comparison:  none  Findings:  Right testis:  3.4 x 2.2 x 2.1 cm.  No mass or edema.  Normal blood flow by Doppler.  Arterial and venous wave forms are identified.  Left testis:  3.6 x 2.1 x 2.2 cm.  Negative for mass.  Normal blood flow by Doppler.  Right epididymis:  Increased blood flow around the right epididymis.  This may be epididymitis however could  also be due to varicocele.  There are some prominent tubular structures in the area which could be dilated veins.  Left epididymis:  Normal in size and appearance.  Hydrocele:  present/absent.  Varicocele:  Possible varicocele versus epididymitis on the right.  Pulsed Doppler interrogation of both testes demonstrates low resistance flow bilaterally.  IMPRESSION: Negative for testicular torsion.  Increased vascularity above the right testicle may represent varicocele versus epididymitis.   Original Report Authenticated By: Camelia Phenes, M.D.    Ct Abdomen Pelvis W Contrast  11/28/2011  *RADIOLOGY REPORT*  Clinical Data: Abdominal and back pain.  Fever.  [facility] spotted fever.  CT ABDOMEN AND PELVIS WITH CONTRAST  Technique:  Multidetector CT imaging of the abdomen and pelvis was performed  following the standard protocol during bolus administration of intravenous contrast.  Contrast: OMNIPAQUE IOHEXOL 300 MG/ML  SOLN  Comparison: None.  Findings: The abdominal parenchymal organs are normal in appearance.  Gallbladder is unremarkable.  No evidence of hydronephrosis.  No soft tissue masses or lymphadenopathy identified within the abdomen or pelvis.  Normal appendix is visualized.  No inflammatory process or abnormal fluid collections are seen within the abdomen or pelvis.  No evidence of bowel wall thickening, dilatation, or hernia.  IMPRESSION: Negative.  No acute findings or other significant abnormality identified.   Original Report Authenticated By: Danae Orleans, M.D.    [place] Art/ven Flow Abd Pelv Doppler  11/27/2011  *RADIOLOGY REPORT*  Clinical Data:  Right scrotal pain  SCROTAL ULTRASOUND DOPPLER ULTRASOUND OF THE TESTICLES  Technique: Complete ultrasound examination of the testicles, epididymis, and other scrotal structures was performed.  Color and spectral Doppler ultrasound were also utilized to evaluate blood flow to the testicles.  Comparison:  none  Findings:  Right testis:  3.4 x 2.2 x 2.1 cm.  No mass or edema.  Normal blood flow by Doppler.  Arterial and venous wave forms are identified.  Left testis:  3.6 x 2.1 x 2.2 cm.  Negative for mass.  Normal blood flow by Doppler.  Right epididymis:  Increased blood flow around the right epididymis.  This may be epididymitis however could also be due to varicocele.  There are some prominent tubular structures in the area which could be dilated veins.  Left epididymis:  Normal in size and appearance.  Hydrocele:  present/absent.  Varicocele:  Possible varicocele versus epididymitis on the right.  Pulsed Doppler interrogation of both testes demonstrates low resistance flow bilaterally.  IMPRESSION: Negative for testicular torsion.  Increased vascularity above the right testicle may represent varicocele versus epididymitis.   Original  Report Authenticated By: Camelia Phenes, M.D.        Microbiology: Recent Results (from the past 240 hour(s))  CULTURE, BLOOD (ROUTINE X 2)     Status: Normal (Preliminary result)   Collection Time   11/27/11  5:10 PM      Component Value Range Status Comment   Specimen Description BLOOD ARM RIGHT   Final    Special Requests BOTTLES DRAWN AEROBIC AND ANAEROBIC 10CC   Final    Culture  Setup Time 11/28/2011 01:33   Final    Culture     Final    Value:        BLOOD CULTURE RECEIVED NO GROWTH TO DATE CULTURE WILL BE HELD FOR 5 DAYS BEFORE ISSUING A FINAL NEGATIVE REPORT   Report Status PENDING   Incomplete   CULTURE, BLOOD (ROUTINE X 2)     Status: Normal (  Preliminary result)   Collection Time   11/27/11  5:20 PM      Component Value Range Status Comment   Specimen Description BLOOD ARM LEFT   Final    Special Requests BOTTLES DRAWN AEROBIC ONLY 10CC   Final    Culture  Setup Time 11/28/2011 01:33   Final    Culture     Final    Value:        BLOOD CULTURE RECEIVED NO GROWTH TO DATE CULTURE WILL BE HELD FOR 5 DAYS BEFORE ISSUING A FINAL NEGATIVE REPORT   Report Status PENDING   Incomplete   URINE CULTURE     Status: Normal   Collection Time   11/27/11  6:37 PM      Component Value Range Status Comment   Specimen Description URINE, CLEAN CATCH   Final    Special Requests ADDED 11/27/11 2014   Final    Culture  Setup Time 11/27/2011 20:47   Final    Colony Count NO GROWTH   Final    Culture NO GROWTH   Final    Report Status 11/28/2011 FINAL   Final      Labs: Results for orders placed during the hospital encounter of 11/27/11 (from the past 48 hour(s))  CBC WITH DIFFERENTIAL     Status: Abnormal   Collection Time   11/27/11 12:50 PM      Component Value Range Comment   WBC 13.1 (*) 4.0 - 10.5 K/uL    RBC 5.46  4.22 - 5.81 MIL/uL    Hemoglobin 15.5  13.0 - 17.0 g/dL    HCT 16.1  09.6 - 04.5 %    MCV 84.2  78.0 - 100.0 fL    MCH 28.4  26.0 - 34.0 pg    MCHC 33.7  30.0 - 36.0  g/dL    RDW 40.9  81.1 - 91.4 %    Platelets 283  150 - 400 K/uL    Neutrophils Relative 81 (*) 43 - 77 %    Neutro Abs 10.4 (*) 1.7 - 7.7 K/uL    Lymphocytes Relative 8 (*) 12 - 46 %    Lymphs Abs 1.0  0.7 - 4.0 K/uL    Monocytes Relative 10  3 - 12 %    Monocytes Absolute 1.3 (*) 0.1 - 1.0 K/uL    Eosinophils Relative 0  0 - 5 %    Eosinophils Absolute 0.0  0.0 - 0.7 K/uL    Basophils Relative 1  0 - 1 %    Basophils Absolute 0.1  0.0 - 0.1 K/uL   COMPREHENSIVE METABOLIC PANEL     Status: Abnormal   Collection Time   11/27/11 12:50 PM      Component Value Range Comment   Sodium 137  135 - 145 mEq/L    Potassium 3.6  3.5 - 5.1 mEq/L    Chloride 95 (*) 96 - 112 mEq/L    CO2 30  19 - 32 mEq/L    Glucose, Bld 126 (*) 70 - 99 mg/dL    BUN 14  6 - 23 mg/dL    Creatinine, Ser 7.82  0.50 - 1.35 mg/dL    Calcium 95.6  8.4 - 10.5 mg/dL    Total Protein 8.0  6.0 - 8.3 g/dL    Albumin 4.1  3.5 - 5.2 g/dL    AST 57 (*) 0 - 37 U/L    ALT 124 (*) 0 - 53 U/L  Alkaline Phosphatase 171 (*) 39 - 117 U/L    Total Bilirubin 1.1  0.3 - 1.2 mg/dL    GFR calc non Af Amer 76 (*) >90 mL/min    GFR calc Af Amer 88 (*) >90 mL/min   B. BURGDORFI ANTIBODIES     Status: Normal   Collection Time   11/27/11 12:50 PM      Component Value Range Comment   B burgdorferi Ab IgG+IgM 0.33     HIV ANTIBODY (ROUTINE TESTING)     Status: Normal   Collection Time   11/27/11  4:57 PM      Component Value Range Comment   HIV NON REACTIVE  NON REACTIVE   ROCKY MTN SPOTTED FVR AB, IGG-BLOOD     Status: Normal   Collection Time   11/27/11  4:57 PM      Component Value Range Comment   RMSF IgG 0.18     ROCKY MTN SPOTTED FVR AB, IGM-BLOOD     Status: Normal   Collection Time   11/27/11  4:57 PM      Component Value Range Comment   RMSF IgM 0.47  0.00 - 0.89 IV   LACTIC ACID, PLASMA     Status: Normal   Collection Time   11/27/11  5:04 PM      Component Value Range Comment   Lactic Acid, Venous 2.2  0.5 - 2.2  mmol/L   CULTURE, BLOOD (ROUTINE X 2)     Status: Normal (Preliminary result)   Collection Time   11/27/11  5:10 PM      Component Value Range Comment   Specimen Description BLOOD ARM RIGHT      Special Requests BOTTLES DRAWN AEROBIC AND ANAEROBIC 10CC      Culture  Setup Time 11/28/2011 01:33      Culture        Value:        BLOOD CULTURE RECEIVED NO GROWTH TO DATE CULTURE WILL BE HELD FOR 5 DAYS BEFORE ISSUING A FINAL NEGATIVE REPORT   Report Status PENDING     CULTURE, BLOOD (ROUTINE X 2)     Status: Normal (Preliminary result)   Collection Time   11/27/11  5:20 PM      Component Value Range Comment   Specimen Description BLOOD ARM LEFT      Special Requests BOTTLES DRAWN AEROBIC ONLY 10CC      Culture  Setup Time 11/28/2011 01:33      Culture        Value:        BLOOD CULTURE RECEIVED NO GROWTH TO DATE CULTURE WILL BE HELD FOR 5 DAYS BEFORE ISSUING A FINAL NEGATIVE REPORT   Report Status PENDING     URINALYSIS, ROUTINE W REFLEX MICROSCOPIC     Status: Abnormal   Collection Time   11/27/11  6:37 PM      Component Value Range Comment   Color, Urine AMBER (*) YELLOW BIOCHEMICALS MAY BE AFFECTED BY COLOR   APPearance CLEAR  CLEAR    Specific Gravity, Urine 1.021  1.005 - 1.030    pH 6.0  5.0 - 8.0    Glucose, UA NEGATIVE  NEGATIVE mg/dL    Hgb urine dipstick NEGATIVE  NEGATIVE    Bilirubin Urine SMALL (*) NEGATIVE    Ketones, ur 15 (*) NEGATIVE mg/dL    Protein, ur NEGATIVE  NEGATIVE mg/dL    Urobilinogen, UA 1.0  0.0 - 1.0 mg/dL    Nitrite NEGATIVE  NEGATIVE    Leukocytes, UA NEGATIVE  NEGATIVE MICROSCOPIC NOT DONE ON URINES WITH NEGATIVE PROTEIN, BLOOD, LEUKOCYTES, NITRITE, OR GLUCOSE <1000 mg/dL.  GC/CHLAMYDIA PROBE AMP, URINE     Status: Normal   Collection Time   11/27/11  6:37 PM      Component Value Range Comment   GC Probe Amp, Urine NEGATIVE  NEGATIVE    Chlamydia, Swab/Urine, PCR NEGATIVE  NEGATIVE   URINE CULTURE     Status: Normal   Collection Time   11/27/11   6:37 PM      Component Value Range Comment   Specimen Description URINE, CLEAN CATCH      Special Requests ADDED 11/27/11 2014      Culture  Setup Time 11/27/2011 20:47      Colony Count NO GROWTH      Culture NO GROWTH      Report Status 11/28/2011 FINAL     RPR     Status: Normal   Collection Time   11/27/11  7:34 PM      Component Value Range Comment   RPR NON REACTIVE  NON REACTIVE   CBC     Status: Normal   Collection Time   11/28/11  6:45 AM      Component Value Range Comment   WBC 9.1  4.0 - 10.5 K/uL    RBC 4.91  4.22 - 5.81 MIL/uL    Hemoglobin 13.6  13.0 - 17.0 g/dL    HCT 32.4  40.1 - 02.7 %    MCV 84.5  78.0 - 100.0 fL    MCH 27.7  26.0 - 34.0 pg    MCHC 32.8  30.0 - 36.0 g/dL    RDW 25.3  66.4 - 40.3 %    Platelets 259  150 - 400 K/uL   COMPREHENSIVE METABOLIC PANEL     Status: Abnormal   Collection Time   11/28/11  9:18 AM      Component Value Range Comment   Sodium 137  135 - 145 mEq/L    Potassium 3.3 (*) 3.5 - 5.1 mEq/L    Chloride 99  96 - 112 mEq/L    CO2 30  19 - 32 mEq/L    Glucose, Bld 104 (*) 70 - 99 mg/dL    BUN 14  6 - 23 mg/dL    Creatinine, Ser 4.74  0.50 - 1.35 mg/dL    Calcium 9.2  8.4 - 25.9 mg/dL    Total Protein 6.8  6.0 - 8.3 g/dL    Albumin 3.3 (*) 3.5 - 5.2 g/dL    AST 41 (*) 0 - 37 U/L    ALT 88 (*) 0 - 53 U/L    Alkaline Phosphatase 138 (*) 39 - 117 U/L    Total Bilirubin 0.6  0.3 - 1.2 mg/dL    GFR calc non Af Amer 81 (*) >90 mL/min    GFR calc Af Amer >90  >90 mL/min   LIPASE, BLOOD     Status: Normal   Collection Time   11/28/11  9:18 AM      Component Value Range Comment   Lipase 29  11 - 59 U/L   LIPASE, BLOOD     Status: Normal   Collection Time   11/28/11 11:09 AM      Component Value Range Comment   Lipase 29  11 - 59 U/L   CK     Status: Normal   Collection Time   11/28/11 11:09  AM      Component Value Range Comment   Total CK 114  7 - 232 U/L   RPR     Status: Normal   Collection Time   11/28/11 11:09 AM       Component Value Range Comment   RPR NON REACTIVE  NON REACTIVE   SEDIMENTATION RATE     Status: Abnormal   Collection Time   11/28/11  5:06 PM      Component Value Range Comment   Sed Rate 18 (*) 0 - 16 mm/hr   URINE RAPID DRUG SCREEN (HOSP PERFORMED)     Status: Normal   Collection Time   11/28/11  7:28 PM      Component Value Range Comment   Opiates NONE DETECTED  NONE DETECTED    Cocaine NONE DETECTED  NONE DETECTED    Benzodiazepines NONE DETECTED  NONE DETECTED    Amphetamines NONE DETECTED  NONE DETECTED    Tetrahydrocannabinol NONE DETECTED  NONE DETECTED    Barbiturates NONE DETECTED  NONE DETECTED   COMPREHENSIVE METABOLIC PANEL     Status: Abnormal   Collection Time   11/29/11  5:50 AM      Component Value Range Comment   Sodium 134 (*) 135 - 145 mEq/L    Potassium 4.0  3.5 - 5.1 mEq/L DELTA CHECK NOTED   Chloride 98  96 - 112 mEq/L    CO2 26  19 - 32 mEq/L    Glucose, Bld 122 (*) 70 - 99 mg/dL    BUN 13  6 - 23 mg/dL    Creatinine, Ser 1.61  0.50 - 1.35 mg/dL    Calcium 9.6  8.4 - 09.6 mg/dL    Total Protein 7.2  6.0 - 8.3 g/dL    Albumin 3.4 (*) 3.5 - 5.2 g/dL    AST 35  0 - 37 U/L    ALT 73 (*) 0 - 53 U/L    Alkaline Phosphatase 141 (*) 39 - 117 U/L    Total Bilirubin 0.3  0.3 - 1.2 mg/dL    GFR calc non Af Amer 82 (*) >90 mL/min    GFR calc Af Amer >90  >90 mL/min      HPI :31 year old gentleman who states that on Thursday he developed a rash on the hands and feet. He had a sore throat, he went to urgent care Center where a rapid strep test was done and negative. He was prescribed amoxicillin. He did not get any better he returned to [facility] the ER 2 days ago, at that time he had no sore throat  But the rash on his hands and feet were still present. He was thought to have a [facility] spotted fever and was started on doxycycline. Since being on the doxycycline the rash on the hand and feet have begun to dry and peel. He had a slight rash on the left brow this  is also drying and peeling. This morning he woke up with severe abdominal pain. When he went to urinate he was painful urination, He states not burning. He states his right testicle felt sore and he had right flank pain. He had a single episode of nausea this morning and here in the ER he had temperature of 102. He denies any chills. He denies having unprotected sex. The hospitalist service has been called to evaluate. The ER physician also spoke with infectious disease Dr. Lodema Hong. History provided by the patient. No  hematuria    HOSPITAL COURSE:  #1 probable epididymitis  Patient was given one dose of Rocephin, 1 g, IV  He has also been provided with a prescription for doxycycline 100 mg twice a day, for a total of 10 days  Urine analysis was negative  GC and Chlamydia probe were negative  Infectious disease was consulted Patient claims he has 10/10 pain associated with doxycycline  Dr Luciana Axe suggested to switch to minocycline as patient is refusing doxycycline , but this would not cure [facility] spotted fever  Patient now agreeable to be on IV doxycycline now RMSF :Ab is negative but certainly early in RMSF, it could be to soon to develop antibodies. I diddiscuss this with him and he is going to continue doxycycline for 2 more days and may get convalescent sera from his PCP in 2 weeks  Given the fact the patient has tolerated the IV doxycycline, will recommend switching him to by mouth doxycycline as there is no other good alternative and patient has been explained this multiple times Requester Dr.Robert Jackolyn Confer, MD, see the patient prior to discharge based on patient request His LFT;s and lipase and CT abdomen were negative, he has no right upper quadrant pain, doubt he has gallbladder pathology If abdominal pain persists will recommend an outpatient [facility]       #2 maculopapular rash  HIV, RPR nonreactive  GC and Chlamydia were negative  Streptococcal group A screen was negative    Blood cultures show no growth so far  [facility] spotted fever panel is negative Patient is hemodynamically stable with no complaints  Patient will be discharged today and followup with infectious disease after completion of his doxycycline  If his rash persists then he may need to followup with dermatology       Discharge Exam:  Blood pressure 121/70, pulse 60, temperature 97.9 F (36.6 C), temperature source Oral, resp. rate 17, height 5' 5.5" (1.664 m), weight 76.204 kg (168 lb), SpO2 100.00%. General: Alert and oriented times three, well developed and nourished, no acute distress  Eyes: PERRLA, pink conjunctiva, no scleral icterus  ENT: Moist oral mucosa, neck supple, no thyromegaly  Lungs: clear to ascultation, no wheeze, no crackles, no use of accessory muscles  Cardiovascular: regular rate and rhythm, no regurgitation, no gallops, no murmurs. No carotid bruits, no JVD  Abdomen: soft, positive BS, non-tender, non-distended, no organomegaly, not an acute abdomen  GU: tender testicles  Neuro: CN II - XII grossly intact, sensation intact  Musculoskeletal: strength 5/5 all extremities, no clubbing, cyanosis or edema  Skin: no rash, no subcutaneous crepitation, no decubitus, macular rash on palms of hands and soles of feet. Mild rash and on face and on the left brow.         Discharge Orders    Future Appointments: Provider: Department: Dept Phone: Center:   01/09/2012 9:15 AM Kerri Perches, MD [facility] [phone] [facility]      Follow-up Information    Follow up with Johny Sax, MD. Schedule an appointment as soon as possible for a visit in 2 weeks. (Posthospital followup)    Contact information:   [address]  [address] [place] [address] [phone]       Follow up with [name], MD. Schedule an appointment as soon as possible for a visit in 1 week.   Contact information:   [address],  [address] [place] [address]  [phone]          Signed: Richarda Overlie 11/29/2011, 8:49 AM

## 2011-11-29 NOTE — Telephone Encounter (Signed)
Called and left message for patient to return call.  

## 2011-11-29 NOTE — Progress Notes (Signed)
[facility]  Date of Admission:  11/27/2011  Antibiotics: doxycycline  Subjective: Still with abd pain  Objective: Temp:  [97.9 F (36.6 C)-98.4 F (36.9 C)] 97.9 F (36.6 C) (10/10 0523) Pulse Rate:  [60-80] 60  (10/10 0523) Resp:  [16-17] 17  (10/10 0523) BP: (113-121)/(63-70) 121/70 mmHg (10/10 0523) SpO2:  [100 %] 100 % (10/10 0523)  General: Awake, nad Skin: palms with macular rash Lungs: CTA B Cor: RRR   Lab Results Lab Results  Component Value Date   WBC 9.1 11/28/2011   HGB 13.6 11/28/2011   HCT 41.5 11/28/2011   MCV 84.5 11/28/2011   PLT 259 11/28/2011    Lab Results  Component Value Date   CREATININE 1.17 11/29/2011   BUN 13 11/29/2011   NA 134* 11/29/2011   K 4.0 11/29/2011   CL 98 11/29/2011   CO2 26 11/29/2011    Lab Results  Component Value Date   ALT 73* 11/29/2011   AST 35 11/29/2011   ALKPHOS 141* 11/29/2011   BILITOT 0.3 11/29/2011      Microbiology: Recent Results (from the past 240 hour(s))  CULTURE, BLOOD (ROUTINE X 2)     Status: Normal (Preliminary result)   Collection Time   11/27/11  5:10 PM      Component Value Range Status Comment   Specimen Description BLOOD ARM RIGHT   Final    Special Requests BOTTLES DRAWN AEROBIC AND ANAEROBIC 10CC   Final    Culture  Setup Time 11/28/2011 01:33   Final    Culture     Final    Value:        BLOOD CULTURE RECEIVED NO GROWTH TO DATE CULTURE WILL BE HELD FOR 5 DAYS BEFORE ISSUING A FINAL NEGATIVE REPORT   Report Status PENDING   Incomplete   CULTURE, BLOOD (ROUTINE X 2)     Status: Normal (Preliminary result)   Collection Time   11/27/11  5:20 PM      Component Value Range Status Comment   Specimen Description BLOOD ARM LEFT   Final    Special Requests BOTTLES DRAWN AEROBIC ONLY 10CC   Final    Culture  Setup Time 11/28/2011 01:33   Final    Culture     Final    Value:        BLOOD CULTURE RECEIVED NO GROWTH TO DATE CULTURE WILL BE HELD FOR 5 DAYS BEFORE  ISSUING A FINAL NEGATIVE REPORT   Report Status PENDING   Incomplete   URINE CULTURE     Status: Normal   Collection Time   11/27/11  6:37 PM      Component Value Range Status Comment   Specimen Description URINE, CLEAN CATCH   Final    Special Requests ADDED 11/27/11 2014   Final    Culture  Setup Time 11/27/2011 20:47   Final    Colony Count NO GROWTH   Final    Culture NO GROWTH   Final    Report Status 11/28/2011 FINAL   Final     Studies/Results: Dg Chest 2 View  11/27/2011  *RADIOLOGY REPORT*  Clinical Data: Fever and rash  CHEST - 2 VIEW  Comparison: 01/10/2011  Findings: Negative for pneumonia.  Lungs are clear without infiltrate or effusion.  Pulmonary vascularity is normal.  Heart size is normal.  Question mild fracture of the mid thoracic spine which was not present previously.  IMPRESSION: No active cardiopulmonary disease.  Question  mild fracture of the mid thoracic spine of indeterminate age.   Original Report Authenticated By: Camelia Phenes, M.D.    US Scrotum  11/27/2011  *RADIOLOGY REPORT*  Clinical Data:  Right scrotal pain  SCROTAL ULTRASOUND DOPPLER ULTRASOUND OF THE TESTICLES  Technique: Complete ultrasound examination of the testicles, epididymis, and other scrotal structures was performed.  Color and spectral Doppler ultrasound were also utilized to evaluate blood flow to the testicles.  Comparison:  none  Findings:  Right testis:  3.4 x 2.2 x 2.1 cm.  No mass or edema.  Normal blood flow by Doppler.  Arterial and venous wave forms are identified.  Left testis:  3.6 x 2.1 x 2.2 cm.  Negative for mass.  Normal blood flow by Doppler.  Right epididymis:  Increased blood flow around the right epididymis.  This may be epididymitis however could also be due to varicocele.  There are some prominent tubular structures in the area which could be dilated veins.  Left epididymis:  Normal in size and appearance.  Hydrocele:  present/absent.  Varicocele:  Possible varicocele versus  epididymitis on the right.  Pulsed Doppler interrogation of both testes demonstrates low resistance flow bilaterally.  IMPRESSION: Negative for testicular torsion.  Increased vascularity above the right testicle may represent varicocele versus epididymitis.   Original Report Authenticated By: Camelia Phenes, M.D.    Ct Abdomen Pelvis W Contrast  11/28/2011  *RADIOLOGY REPORT*  Clinical Data: Abdominal and back pain.  Fever.  [facility] spotted fever.  CT ABDOMEN AND PELVIS WITH CONTRAST  Technique:  Multidetector CT imaging of the abdomen and pelvis was performed following the standard protocol during bolus administration of intravenous contrast.  Contrast: OMNIPAQUE IOHEXOL 300 MG/ML  SOLN  Comparison: None.  Findings: The abdominal parenchymal organs are normal in appearance.  Gallbladder is unremarkable.  No evidence of hydronephrosis.  No soft tissue masses or lymphadenopathy identified within the abdomen or pelvis.  Normal appendix is visualized.  No inflammatory process or abnormal fluid collections are seen within the abdomen or pelvis.  No evidence of bowel wall thickening, dilatation, or hernia.  IMPRESSION: Negative.  No acute findings or other significant abnormality identified.   Original Report Authenticated By: Danae Orleans, M.D.    [place] Art/ven Flow Abd Pelv Doppler  11/27/2011  *RADIOLOGY REPORT*  Clinical Data:  Right scrotal pain  SCROTAL ULTRASOUND DOPPLER ULTRASOUND OF THE TESTICLES  Technique: Complete ultrasound examination of the testicles, epididymis, and other scrotal structures was performed.  Color and spectral Doppler ultrasound were also utilized to evaluate blood flow to the testicles.  Comparison:  none  Findings:  Right testis:  3.4 x 2.2 x 2.1 cm.  No mass or edema.  Normal blood flow by Doppler.  Arterial and venous wave forms are identified.  Left testis:  3.6 x 2.1 x 2.2 cm.  Negative for mass.  Normal blood flow by Doppler.  Right epididymis:  Increased blood flow  around the right epididymis.  This may be epididymitis however could also be due to varicocele.  There are some prominent tubular structures in the area which could be dilated veins.  Left epididymis:  Normal in size and appearance.  Hydrocele:  present/absent.  Varicocele:  Possible varicocele versus epididymitis on the right.  Pulsed Doppler interrogation of both testes demonstrates low resistance flow bilaterally.  IMPRESSION: Negative for testicular torsion.  Increased vascularity above the right testicle may represent varicocele versus epididymitis.   Original Report Authenticated By: DAVID  C. Chestine Spore, M.D.     Assessment/Plan: 1) Rash, abdominal pain, fever - has been afebrile, WBC improved while on doxy.  It does make it suspicious for RMSF.  Ab is negative but certainly early in RMSF, it could be to soon to develop antibodies.  I diddiscuss this with him and he is going to continue doxycycline for 2 more days and may get convalescent sera from his PCP in 2 weeks.    2) abd pain - with an elevated alk phos and LFTs, I will check an ultrasound to be sure no sludge, stones prior to him leaving.     Staci Righter, MD [facility] [phone] pager   11/29/2011, 5:02 PM

## 2011-11-30 ENCOUNTER — Observation Stay (HOSPITAL_COMMUNITY): Payer: Managed Care, Other (non HMO)

## 2011-11-30 LAB — HEPATIC FUNCTION PANEL
ALT: 99 U/L — ABNORMAL HIGH (ref 0–53)
Indirect Bilirubin: 0.4 mg/dL (ref 0.3–0.9)
Total Protein: 8.1 g/dL (ref 6.0–8.3)

## 2011-11-30 MED ORDER — OXYCODONE-ACETAMINOPHEN 5-325 MG PO TABS: 1.0000 | ORAL_TABLET | Freq: Four times a day (QID) | ORAL | Status: DC | PRN

## 2011-11-30 NOTE — Progress Notes (Signed)
[facility]  Date of Admission:  11/27/2011  Antibiotics: doxycycline  Subjective: Still with abd pain, able to take doxy better  Objective: Temp:  [98.1 F (36.7 C)-98.4 F (36.9 C)] 98.1 F (36.7 C) (10/11 0600) Pulse Rate:  [66-84] 84  (10/11 0600) Resp:  [18-20] 20  (10/11 0600) BP: (113-121)/(74-78) 113/78 mmHg (10/11 0600) SpO2:  [100 %] 100 % (10/11 0600)  General: Awake, nad Skin: palms with macular rash Lungs: CTA B Cor: RRR   Lab Results Lab Results  Component Value Date   WBC 9.1 11/28/2011   HGB 13.6 11/28/2011   HCT 41.5 11/28/2011   MCV 84.5 11/28/2011   PLT 259 11/28/2011    Lab Results  Component Value Date   CREATININE 1.17 11/29/2011   BUN 13 11/29/2011   NA 134* 11/29/2011   K 4.0 11/29/2011   CL 98 11/29/2011   CO2 26 11/29/2011    Lab Results  Component Value Date   ALT 99* 11/30/2011   AST 62* 11/30/2011   ALKPHOS 156* 11/30/2011   BILITOT 0.5 11/30/2011      Microbiology: Recent Results (from the past 240 hour(s))  CULTURE, BLOOD (ROUTINE X 2)     Status: Normal (Preliminary result)   Collection Time   11/27/11  5:10 PM      Component Value Range Status Comment   Specimen Description BLOOD ARM RIGHT   Final    Special Requests BOTTLES DRAWN AEROBIC AND ANAEROBIC 10CC   Final    Culture  Setup Time 11/28/2011 01:33   Final    Culture     Final    Value:        BLOOD CULTURE RECEIVED NO GROWTH TO DATE CULTURE WILL BE HELD FOR 5 DAYS BEFORE ISSUING A FINAL NEGATIVE REPORT   Report Status PENDING   Incomplete   CULTURE, BLOOD (ROUTINE X 2)     Status: Normal (Preliminary result)   Collection Time   11/27/11  5:20 PM      Component Value Range Status Comment   Specimen Description BLOOD ARM LEFT   Final    Special Requests BOTTLES DRAWN AEROBIC ONLY 10CC   Final    Culture  Setup Time 11/28/2011 01:33   Final    Culture     Final    Value:        BLOOD CULTURE RECEIVED NO GROWTH TO DATE CULTURE WILL BE  HELD FOR 5 DAYS BEFORE ISSUING A FINAL NEGATIVE REPORT   Report Status PENDING   Incomplete   URINE CULTURE     Status: Normal   Collection Time   11/27/11  6:37 PM      Component Value Range Status Comment   Specimen Description URINE, CLEAN CATCH   Final    Special Requests ADDED 11/27/11 2014   Final    Culture  Setup Time 11/27/2011 20:47   Final    Colony Count NO GROWTH   Final    Culture NO GROWTH   Final    Report Status 11/28/2011 FINAL   Final     Studies/Results: US Abdomen Complete  11/30/2011  *RADIOLOGY REPORT*  Clinical Data:  History of abdominal pain.  ABDOMINAL ULTRASOUND COMPLETE  Comparison:  CT 11/28/2011.  Ultrasound 05/23/2004.  Findings:  Gallbladder: No shadowing gallstones or echogenic sludge. No gallbladder wall thickening or pericholecystic fluid. The gallbladder wall thickness measured 2.3 mm. No sonographic Murphy's sign according to the ultrasound technologist.  CBD:  Normal in caliber measuring 4.7 mm. No choledocholithiasis is evident.  Liver:  Normal size with slight increased echogenicity of the hepatic parenchymal echotexture without focal parenchymal abnormality. Portal vein appeared patent with hepatopetal flow.  IVC:  Patent throughout its visualized course in the abdomen.  Pancreas: Pancreatic tissue was obscured by bowel gas.  Spleen:  Normal size and echotexture without focal abnormality. Length is 5 cm.  Right kidney:  No hydronephrosis.  Well-preserved cortex.  Normal parenchymal echotexture without focal abnormalities.  Right renal length is 10.3 cm.  Left kidney:  No hydronephrosis.  Well-preserved cortex.  Normal parenchymal echotexture without focal abnormalities.  Left renal length is 9.9 cm.  Aorta:  Maximum diameter is 1.9 cm.  No aneurysm is evident.  Ascites:  None.  IMPRESSION: No acute abdominal pathology was demonstrated.  There is slight increased echogenicity of hepatic parenchymal echotexture.  Most commonly this is associated with fatty  infiltration.  On prior CT there is slight decreased attenuation of the parenchyma of the liver compared to the spleen.   Original Report Authenticated By: Crawford Givens, M.D.    Ct Abdomen Pelvis W Contrast  11/28/2011  *RADIOLOGY REPORT*  Clinical Data: Abdominal and back pain.  Fever.  [facility] spotted fever.  CT ABDOMEN AND PELVIS WITH CONTRAST  Technique:  Multidetector CT imaging of the abdomen and pelvis was performed following the standard protocol during bolus administration of intravenous contrast.  Contrast: OMNIPAQUE IOHEXOL 300 MG/ML  SOLN  Comparison: None.  Findings: The abdominal parenchymal organs are normal in appearance.  Gallbladder is unremarkable.  No evidence of hydronephrosis.  No soft tissue masses or lymphadenopathy identified within the abdomen or pelvis.  Normal appendix is visualized.  No inflammatory process or abnormal fluid collections are seen within the abdomen or pelvis.  No evidence of bowel wall thickening, dilatation, or hernia.  IMPRESSION: Negative.  No acute findings or other significant abnormality identified.   Original Report Authenticated By: Danae Orleans, M.D.     Assessment/Plan: 1) Rash, abdominal pain, fever - has been afebrile, WBC improved while on doxy.  It does make it suspicious for RMSF.  Ab is negative but certainly early in RMSF, it could be to soon to develop antibodies.  I diddiscuss this with him and he is going to continue doxycycline for 2 more days and may get convalescent sera from his PCP in 2 weeks.    2) abd pain - with an elevated alk phos and LFTs, ultrasound negative.     Staci Righter, MD [facility] for Infectious Disease [facility] [phone] pager   11/30/2011, 10:52 AM

## 2011-11-30 NOTE — Progress Notes (Signed)
[facility] PROGRESS NOTE  Devon Macdonald HYQ:[id number] DOB: [birth year] DOA: 11/27/2011 PCP: [name], MD  Assessment/Plan: Active Problems:  Maculopapular rash  Abdominal pain in male patient  Epididymitis    Maculopapular rash improving, continue doxycycline for suspected [facility] spotted fever Abdominal pain in male patient , hepatic ultrasound pending, will recheck a hepatic panel, nausea objective findings other than epididymitis Epididymitis continue doxycycline per infectious disease recommendations    Code Status: full Family Communication: family updated about patient's clinical progress Disposition Plan:  DC home today after ultrasound is negative   Brief narrative: This is a 31 year old gentleman who states that on Thursday he developed a rash on the hands and feet. He had a sore throat, he went to urgent care Center where a rapid strep test was done and negative. He was prescribed amoxicillin. He did not get any better he returned to [facility] ER 2 days ago, at that time he had no sore throat  But the rash on his hands and feet were still present. He was thought to have a [facility] spotted fever and was started on doxycycline. Since being on the doxycycline the rash on the hand and feet have begun to dry and peel. He had a slight rash on the left brow this is also drying and peeling. This morning he woke up with severe abdominal pain. When he went to urinate he was painful urination, He states not burning. He states his right testicle felt sore and he had right flank pain. He had a single episode of nausea this morning and here in the ER he had temperature of 102. He denies any chills. He denies having unprotected sex. The hospitalist service has been called to evaluate. The ER physician also spoke with infectious disease Dr. Lodema Hong. History provided by the patient. No hematuria.   Consultants:  Infectious disease  Procedures:  CT scan of  the abdomen  Abdominal ultrasound  Antibiotics:  Doxycycline orally  HPI/Subjective: Abdominal pain unchanged  Objective: Filed Vitals:   11/28/11 2140 11/29/11 0523 11/29/11 2127 11/30/11 0600  BP: 113/63 121/70 121/74 113/78  Pulse: 80 60 66 84  Temp: 98.4 F (36.9 C) 97.9 F (36.6 C) 98.4 F (36.9 C) 98.1 F (36.7 C)  TempSrc: Oral Oral    Resp: 16 17 18 20   Height:      Weight:      SpO2: 100% 100% 100% 100%   No intake or output data in the 24 hours ending 11/30/11 0817  Exam:  General: Alert and oriented times three, well developed and nourished, no acute distress  Eyes: PERRLA, pink conjunctiva, no scleral icterus  ENT: Moist oral mucosa, neck supple, no thyromegaly  Lungs: clear to ascultation, no wheeze, no crackles, no use of accessory muscles  Cardiovascular: regular rate and rhythm, no regurgitation, no gallops, no murmurs. No carotid bruits, no JVD  Abdomen: soft, positive BS, non-tender, non-distended, no organomegaly, not an acute abdomen  GU: tender testicles  Neuro: CN II - XII grossly intact, sensation intact  Musculoskeletal: strength 5/5 all extremities, no clubbing, cyanosis or edema  Skin: no rash, no subcutaneous crepitation, no decubitus, macular rash on palms of hands and soles of feet. Mild rash and on face and on the left brow.   Data Reviewed: Basic Metabolic Panel:  Lab 11/29/11 9528 11/28/11 0918 11/27/11 1250  NA 134* 137 137  K 4.0 3.3* 3.6  CL 98 99 95*  CO2 26 30 30  GLUCOSE 122* 104* 126*  BUN 13 14 14   CREATININE 1.17 1.19 1.25  CALCIUM 9.6 9.2 10.2  MG -- -- --  PHOS -- -- --    Liver Function Tests:  Lab 11/29/11 0550 11/28/11 0918 11/27/11 1250  AST 35 41* 57*  ALT 73* 88* 124*  ALKPHOS 141* 138* 171*  BILITOT 0.3 0.6 1.1  PROT 7.2 6.8 8.0  ALBUMIN 3.4* 3.3* 4.1    Lab 11/28/11 1109 11/28/11 0918  LIPASE 29 29  AMYLASE -- --   No results found for this basename: AMMONIA:5 in the last 168  hours  CBC:  Lab 11/28/11 0645 11/27/11 1250  WBC 9.1 13.1*  NEUTROABS -- 10.4*  HGB 13.6 15.5  HCT 41.5 46.0  MCV 84.5 84.2  PLT 259 283    Cardiac Enzymes:  Lab 11/28/11 1109  CKTOTAL 114  CKMB --  CKMBINDEX --  TROPONINI --   BNP (last 3 results) No results found for this basename: PROBNP:3 in the last 8760 hours   CBG: No results found for this basename: GLUCAP:5 in the last 168 hours  Recent Results (from the past 240 hour(s))  CULTURE, BLOOD (ROUTINE X 2)     Status: Normal (Preliminary result)   Collection Time   11/27/11  5:10 PM      Component Value Range Status Comment   Specimen Description BLOOD ARM RIGHT   Final    Special Requests BOTTLES DRAWN AEROBIC AND ANAEROBIC 10CC   Final    Culture  Setup Time 11/28/2011 01:33   Final    Culture     Final    Value:        BLOOD CULTURE RECEIVED NO GROWTH TO DATE CULTURE WILL BE HELD FOR 5 DAYS BEFORE ISSUING A FINAL NEGATIVE REPORT   Report Status PENDING   Incomplete   CULTURE, BLOOD (ROUTINE X 2)     Status: Normal (Preliminary result)   Collection Time   11/27/11  5:20 PM      Component Value Range Status Comment   Specimen Description BLOOD ARM LEFT   Final    Special Requests BOTTLES DRAWN AEROBIC ONLY 10CC   Final    Culture  Setup Time 11/28/2011 01:33   Final    Culture     Final    Value:        BLOOD CULTURE RECEIVED NO GROWTH TO DATE CULTURE WILL BE HELD FOR 5 DAYS BEFORE ISSUING A FINAL NEGATIVE REPORT   Report Status PENDING   Incomplete   URINE CULTURE     Status: Normal   Collection Time   11/27/11  6:37 PM      Component Value Range Status Comment   Specimen Description URINE, CLEAN CATCH   Final    Special Requests ADDED 11/27/11 2014   Final    Culture  Setup Time 11/27/2011 20:47   Final    Colony Count NO GROWTH   Final    Culture NO GROWTH   Final    Report Status 11/28/2011 FINAL   Final      Studies: Dg Chest 2 View  11/27/2011  *RADIOLOGY REPORT*  Clinical Data: Fever and rash   CHEST - 2 VIEW  Comparison: 01/10/2011  Findings: Negative for pneumonia.  Lungs are clear without infiltrate or effusion.  Pulmonary vascularity is normal.  Heart size is normal.  Question mild fracture of the mid thoracic spine which was not present previously.  IMPRESSION: No active cardiopulmonary disease.  Question mild fracture of the mid thoracic spine of indeterminate age.   Original Report Authenticated By: Camelia Phenes, M.D.    US Scrotum  11/27/2011  *RADIOLOGY REPORT*  Clinical Data:  Right scrotal pain  SCROTAL ULTRASOUND DOPPLER ULTRASOUND OF THE TESTICLES  Technique: Complete ultrasound examination of the testicles, epididymis, and other scrotal structures was performed.  Color and spectral Doppler ultrasound were also utilized to evaluate blood flow to the testicles.  Comparison:  none  Findings:  Right testis:  3.4 x 2.2 x 2.1 cm.  No mass or edema.  Normal blood flow by Doppler.  Arterial and venous wave forms are identified.  Left testis:  3.6 x 2.1 x 2.2 cm.  Negative for mass.  Normal blood flow by Doppler.  Right epididymis:  Increased blood flow around the right epididymis.  This may be epididymitis however could also be due to varicocele.  There are some prominent tubular structures in the area which could be dilated veins.  Left epididymis:  Normal in size and appearance.  Hydrocele:  present/absent.  Varicocele:  Possible varicocele versus epididymitis on the right.  Pulsed Doppler interrogation of both testes demonstrates low resistance flow bilaterally.  IMPRESSION: Negative for testicular torsion.  Increased vascularity above the right testicle may represent varicocele versus epididymitis.   Original Report Authenticated By: Camelia Phenes, M.D.    Ct Abdomen Pelvis W Contrast  11/28/2011  *RADIOLOGY REPORT*  Clinical Data: Abdominal and back pain.  Fever.  [facility] spotted fever.  CT ABDOMEN AND PELVIS WITH CONTRAST  Technique:  Multidetector CT imaging of the abdomen and  pelvis was performed following the standard protocol during bolus administration of intravenous contrast.  Contrast: OMNIPAQUE IOHEXOL 300 MG/ML  SOLN  Comparison: None.  Findings: The abdominal parenchymal organs are normal in appearance.  Gallbladder is unremarkable.  No evidence of hydronephrosis.  No soft tissue masses or lymphadenopathy identified within the abdomen or pelvis.  Normal appendix is visualized.  No inflammatory process or abnormal fluid collections are seen within the abdomen or pelvis.  No evidence of bowel wall thickening, dilatation, or hernia.  IMPRESSION: Negative.  No acute findings or other significant abnormality identified.   Original Report Authenticated By: Danae Orleans, M.D.    [place] Art/ven Flow Abd Pelv Doppler  11/27/2011  *RADIOLOGY REPORT*  Clinical Data:  Right scrotal pain  SCROTAL ULTRASOUND DOPPLER ULTRASOUND OF THE TESTICLES  Technique: Complete ultrasound examination of the testicles, epididymis, and other scrotal structures was performed.  Color and spectral Doppler ultrasound were also utilized to evaluate blood flow to the testicles.  Comparison:  none  Findings:  Right testis:  3.4 x 2.2 x 2.1 cm.  No mass or edema.  Normal blood flow by Doppler.  Arterial and venous wave forms are identified.  Left testis:  3.6 x 2.1 x 2.2 cm.  Negative for mass.  Normal blood flow by Doppler.  Right epididymis:  Increased blood flow around the right epididymis.  This may be epididymitis however could also be due to varicocele.  There are some prominent tubular structures in the area which could be dilated veins.  Left epididymis:  Normal in size and appearance.  Hydrocele:  present/absent.  Varicocele:  Possible varicocele versus epididymitis on the right.  Pulsed Doppler interrogation of both testes demonstrates low resistance flow bilaterally.  IMPRESSION: Negative for testicular torsion.  Increased vascularity above the right testicle may represent varicocele versus  epididymitis.   Original Report Authenticated By:  Camelia Phenes, M.D.     Scheduled Meds:   . bisacodyl  10 mg Rectal Once  . doxycycline (VIBRAMYCIN) IV  100 mg Intravenous Q12H  . enoxaparin (LOVENOX) injection  40 mg Subcutaneous Q24H  . lactulose  30 g Oral Once  . triamterene-hydrochlorothiazide  1 tablet Oral Daily  . DISCONTD: bisacodyl  10 mg Rectal Once   Continuous Infusions:   Active Problems:  Maculopapular rash  Abdominal pain in male patient  Epididymitis    Time spent: 40 minutes   [facility]  [facility] Pager [phone]. If 8PM-8AM, please contact night-coverage at [email or web address], password [facility] 11/30/2011, 8:17 AM  LOS: 3 days

## 2011-12-04 ENCOUNTER — Ambulatory Visit (INDEPENDENT_AMBULATORY_CARE_PROVIDER_SITE_OTHER): Payer: Managed Care, Other (non HMO) | Admitting: Family Medicine

## 2011-12-04 ENCOUNTER — Encounter: Payer: Self-pay | Admitting: Family Medicine

## 2011-12-04 VITALS — BP 112/84 | HR 95 | Resp 16 | Ht 65.0 in | Wt 169.0 lb

## 2011-12-04 DIAGNOSIS — L309 Dermatitis, unspecified: Secondary | ICD-10-CM | POA: Insufficient documentation

## 2011-12-04 DIAGNOSIS — I10 Essential (primary) hypertension: Secondary | ICD-10-CM

## 2011-12-04 DIAGNOSIS — R21 Rash and other nonspecific skin eruption: Secondary | ICD-10-CM

## 2011-12-04 DIAGNOSIS — Z23 Encounter for immunization: Secondary | ICD-10-CM

## 2011-12-04 DIAGNOSIS — L259 Unspecified contact dermatitis, unspecified cause: Secondary | ICD-10-CM

## 2011-12-04 DIAGNOSIS — N451 Epididymitis: Secondary | ICD-10-CM

## 2011-12-04 DIAGNOSIS — N453 Epididymo-orchitis: Secondary | ICD-10-CM

## 2011-12-04 LAB — CULTURE, BLOOD (ROUTINE X 2): Culture: NO GROWTH

## 2011-12-04 MED ORDER — TRIAMTERENE-HCTZ 75-50 MG PO TABS: 1.0000 | ORAL_TABLET | Freq: Every day | ORAL | Status: DC

## 2011-12-04 MED ORDER — DOXYCYCLINE HYCLATE 50 MG PO CAPS: 100.0000 mg | ORAL_CAPSULE | Freq: Two times a day (BID) | ORAL | Status: DC

## 2011-12-04 NOTE — Progress Notes (Signed)
  Subjective:    Patient ID: Devon Macdonald, male    DOB: Dec 04, 1980, 31 y.o.   MRN: [id number]  HPI Pt in for follow up of recent hospitalization for severe dermatitis of hands and feet of unknown etiology, massive infectious screen was done everything negative. Pt now exeperiencing peeling of skin on hands and he has a residual rash on the feet, he has been peeling the skin off , needs to return to work but is at high risk for secondary infection due to skin breakdown. At presentation he also had acute swelling of the testicles, imaging study inconclusive, he wants urologic evaluation of this also Discharged as possible [place] spotted fever on doxycycline, the titer is however negative    Review of Systems See HPI Denies recent fever or chills. Denies sinus pressure, nasal congestion, ear pain or sore throat. Denies chest congestion, productive cough or wheezing. Denies chest pains, palpitations and leg swelling Denies abdominal pain, nausea, vomiting,diarrhea or constipation.   Denies dysuria, frequency, hesitancy or incontinence. Denies joint pain, swelling and limitation in mobility. Denies headaches, seizures, numbness, or tingling. Denies depression, anxiety or insomnia.       Objective:   Physical Exam Patient alert and oriented and in no cardiopulmonary distress.  HEENT: No facial asymmetry, EOMI, no sinus tenderness,  oropharynx pink and moist.  Neck supple no adenopathy.External ear canal and TM's normal  Chest: Clear to auscultation bilaterally.  CVS: S1, S2 no murmurs, no S3.  ABD: Soft non tender. Bowel sounds normal. Genitalia : not examined Ext: No edema  MS: Adequate ROM spine, shoulders, hips and knees.  Skin: macula papular hyperpigmented rash on palms and soles, excessive skin trauma with peeling, no purulent drainage  Psych: Good eye contact, normal affect. Memory intact not anxious or depressed appearing.  CNS: CN 2-12 intact, power, tone  and sensation normal throughout.        Assessment & Plan:

## 2011-12-04 NOTE — Patient Instructions (Addendum)
CPE in November, please call if you need me before  You have been prescribed an additional 10 days of doxycycline sine you are at increased risk for skin infection  Please protect your hands by wearing gloves as discussed, also ensure you practice good hand and foot hygiene, with appropriate washing, ok to apply small amt of pure vaseline to open areas, and please STOP peeling the skin, it will fall off when ready  Flu shot today.  Ear exam shown no sores in the ear   You are referred to dermatology also

## 2011-12-05 DIAGNOSIS — Z23 Encounter for immunization: Secondary | ICD-10-CM

## 2011-12-05 NOTE — Assessment & Plan Note (Signed)
Acute episode with abnormal ultrasound of testes, will refer to urology for follow up and re eval

## 2011-12-05 NOTE — Assessment & Plan Note (Signed)
Improved , however pt at high risk of secondary infection, will repeat doxycycline, discussed with pt importance of protecting skin from repeated trauma

## 2011-12-05 NOTE — Assessment & Plan Note (Signed)
Controlled, no change in medication DASH diet and commitment to daily physical activity for a minimum of 30 minutes discussed and encouraged, as a part of hypertension management. The importance of attaining a healthy weight is also discussed.  

## 2011-12-14 ENCOUNTER — Telehealth: Payer: Self-pay

## 2011-12-14 NOTE — Telephone Encounter (Signed)
Noted,

## 2012-01-09 ENCOUNTER — Ambulatory Visit: Payer: Managed Care, Other (non HMO) | Admitting: Family Medicine

## 2012-02-01 ENCOUNTER — Telehealth: Payer: Self-pay | Admitting: Family Medicine

## 2012-02-01 ENCOUNTER — Other Ambulatory Visit: Payer: Self-pay

## 2012-02-01 ENCOUNTER — Other Ambulatory Visit: Payer: Self-pay | Admitting: Family Medicine

## 2012-02-01 MED ORDER — PREDNISONE (PAK) 5 MG PO TABS: 5.0000 mg | ORAL_TABLET | ORAL | Status: DC

## 2012-02-01 MED ORDER — PROMETHAZINE-DM 6.25-15 MG/5ML PO SYRP: ORAL_SOLUTION | ORAL | Status: DC

## 2012-02-01 NOTE — Telephone Encounter (Signed)
Please advise 

## 2012-02-01 NOTE — Telephone Encounter (Signed)
Patient aware and meds sent 

## 2012-02-01 NOTE — Telephone Encounter (Signed)
pred dose pack and phenergan dm entered historically, pls send after you spk with him. If he has fever, chills , green sputum etc, may need to go to urgent care, or I will work him in next [place], goive that appt to him if needed, or he can call back

## 2012-02-07 ENCOUNTER — Ambulatory Visit (INDEPENDENT_AMBULATORY_CARE_PROVIDER_SITE_OTHER): Payer: Managed Care, Other (non HMO) | Admitting: Family Medicine

## 2012-02-07 ENCOUNTER — Encounter: Payer: Self-pay | Admitting: Family Medicine

## 2012-02-07 VITALS — BP 110/82 | HR 99 | Resp 16 | Ht 65.0 in | Wt 167.0 lb

## 2012-02-07 DIAGNOSIS — I10 Essential (primary) hypertension: Secondary | ICD-10-CM

## 2012-02-07 DIAGNOSIS — J0120 Acute ethmoidal sinusitis, unspecified: Secondary | ICD-10-CM | POA: Insufficient documentation

## 2012-02-07 DIAGNOSIS — J209 Acute bronchitis, unspecified: Secondary | ICD-10-CM

## 2012-02-07 DIAGNOSIS — E785 Hyperlipidemia, unspecified: Secondary | ICD-10-CM

## 2012-02-07 LAB — HEPATIC FUNCTION PANEL
ALT: 170 U/L — ABNORMAL HIGH (ref 0–53)
AST: 57 U/L — ABNORMAL HIGH (ref 0–37)
Alkaline Phosphatase: 105 U/L (ref 39–117)
Bilirubin, Direct: 0.1 mg/dL (ref 0.0–0.3)
Indirect Bilirubin: 0.3 mg/dL (ref 0.0–0.9)
Total Bilirubin: 0.4 mg/dL (ref 0.3–1.2)

## 2012-02-07 MED ORDER — BENZONATATE 100 MG PO CAPS: 100.0000 mg | ORAL_CAPSULE | Freq: Four times a day (QID) | ORAL | Status: DC | PRN

## 2012-02-07 MED ORDER — SULFAMETHOXAZOLE-TRIMETHOPRIM 800-160 MG PO TABS: 1.0000 | ORAL_TABLET | Freq: Two times a day (BID) | ORAL | Status: DC

## 2012-02-07 NOTE — Progress Notes (Signed)
  Subjective:    Patient ID: Devon Macdonald, male    DOB: 1980/05/23, 31 y.o.   MRN: [id number]  HPI The PT is here for follow up and re-evaluation of chronic medical conditions, medication management and review of any available recent lab and radiology data.  Preventive health is updated, specifically  Cancer screening and Immunization.   10 day h/o worsening sinus pressure, facial pain, green nasal drainage, also cough productive of green sputum and intermittent chills    Review of Systems See HPI  Denies chest pains, palpitations and leg swelling Denies abdominal pain, nausea, vomiting,diarrhea or constipation.   Denies dysuria, frequency, hesitancy or incontinence. Denies joint pain, swelling and limitation in mobility. Denies , seizures, numbness, or tingling.c/o intermittent headaches, requests vicodin for as needed use, which I have advised against and will not prescribe , as I really do not believe this is necessary Denies depression, anxiety or insomnia. Denies skin break down or rash.        Objective:   Physical Exam  Patient alert and oriented and in no cardiopulmonary distress.  HEENT: No facial asymmetry, EOMI, ethmoid  sinus tenderness,  oropharynx pink and moist.  Neck supple, anterior cervical adenopathy.  Chest: decreased Air entry , scattered crackles, no wheezes  CVS: S1, S2 no murmurs, no S3.  ABD: Soft non tender. Bowel sounds normal.  Ext: No edema  MS: Adequate ROM spine, shoulders, hips and knees.  Skin: Intact, no ulcerations or rash noted.  Psych: Good eye contact, normal affect. Memory intact not anxious or depressed appearing.  CNS: CN 2-12 intact, power, tone and sensation normal throughout.       Assessment & Plan:

## 2012-02-07 NOTE — Patient Instructions (Addendum)
F/u in 5.5 to 6 month  Please call if you need me before.  Blood pressure is excellent, no med change at this time  Hepatic panel today  Medication sent ion for acute sinusitis and bronchitis.

## 2012-02-08 ENCOUNTER — Other Ambulatory Visit: Payer: Self-pay | Admitting: Family Medicine

## 2012-02-27 ENCOUNTER — Telehealth: Payer: Self-pay | Admitting: Family Medicine

## 2012-02-27 NOTE — Telephone Encounter (Signed)
pls ask and document, if he has sinus pressure as a cause of the headache use tylenol sinus and OTC saline nasal wash 3 times daily, also OTC claritin or zyrtec 1 daily and keep warm and reduce exposure to the cold. If

## 2012-02-27 NOTE — Telephone Encounter (Signed)
Patient aware to just use tylenol or advil and to come in if symptoms do not improve- no sinus drainage or other symptoms

## 2012-02-27 NOTE — Telephone Encounter (Signed)
C/o having a headache for the past 3 days. Has tried advil and tylenol- no relief. Wants to see is there is something else that can help

## 2012-03-01 DIAGNOSIS — R7401 Elevation of levels of liver transaminase levels: Secondary | ICD-10-CM | POA: Insufficient documentation

## 2012-03-01 NOTE — Assessment & Plan Note (Signed)
Acute infection, decongestant, antibiotic and cough suppressant prescribed

## 2012-03-01 NOTE — Assessment & Plan Note (Signed)
Abnormally elevated ;liver enzymes persistent and worsened, GI eval

## 2012-03-01 NOTE — Assessment & Plan Note (Signed)
Controlled, no change in medication DASH diet and commitment to daily physical activity for a minimum of 30 minutes discussed and encouraged, as a part of hypertension management. The importance of attaining a healthy weight is also discussed.  

## 2012-03-01 NOTE — Assessment & Plan Note (Signed)
Acute infection, antibiotics prescribed 

## 2012-03-01 NOTE — Assessment & Plan Note (Signed)
Hyperlipidemia:Low fat diet discussed and encouraged.  Updated lab needed 

## 2012-03-06 ENCOUNTER — Encounter (INDEPENDENT_AMBULATORY_CARE_PROVIDER_SITE_OTHER): Payer: Self-pay | Admitting: Internal Medicine

## 2012-03-06 ENCOUNTER — Ambulatory Visit (INDEPENDENT_AMBULATORY_CARE_PROVIDER_SITE_OTHER): Payer: Managed Care, Other (non HMO) | Admitting: Internal Medicine

## 2012-03-06 VITALS — BP 118/70 | HR 72 | Temp 98.7°F | Ht 65.5 in | Wt 172.2 lb

## 2012-03-06 DIAGNOSIS — R748 Abnormal levels of other serum enzymes: Secondary | ICD-10-CM

## 2012-03-06 NOTE — Patient Instructions (Addendum)
Hepatitis B and Hep C. Hepatic function, OV in 2 months

## 2012-03-06 NOTE — Progress Notes (Addendum)
Subjective:     Patient ID: Devon Macdonald, male   DOB: 07/12/1980, 32 y.o.   MRN: [id number]  HPI Referred to our office for elevated liver enzymes. He does not think his liver enzymes have been elevated in the past. No IV drugs. No tattoos. No unprotected sex. Treated for RMSF in October with Doxycycline. He had a rash to his hands which itched and burned. He also had a sorethroat. He was diagnosed with Strep. Then the next morning he developed a rash to his feet.He also says he had a rash in his nose and ears. Both his hands and feet itched and burned. The rash lasted about 1 1/2 months. It actually started getting better after starting the Doxycyline.  He was treated with Penicillin and ? Medication for nausea. The next day he went to the ED and was diagnosed with RMSF but the titer was negative. He was treated with Doxycycline x 10 days empirically.  Two days later, he got up and fell in the floor due to pain in his rt testicle. 11/27/2011 US scrotumIMPRESSION:  Negative for testicular torsion.  Increased vascularity above the right testicle may represent  varicocele versus epididymitis.   Treated for epididymitis.  He had a fever 102 on admission. Admitted to the hospital [facility]) and no definitive  diagnoses was made for his rash. HIV, GC chlamydia, RPR was negative, Rapid strep was negative.  Blood cultures were negative. ANA normal. Sedrate slightly elevated at 18.    Presently taking Bactrim for a sinus infection.  CBC    Component Value Date/Time   WBC 9.1 11/28/2011 0645   RBC 4.91 11/28/2011 0645   HGB 13.6 11/28/2011 0645   HCT 41.5 11/28/2011 0645   PLT 259 11/28/2011 0645   MCV 84.5 11/28/2011 0645   MCH 27.7 11/28/2011 0645   MCHC 32.8 11/28/2011 0645   RDW 13.6 11/28/2011 0645   LYMPHSABS 1.0 11/27/2011 1250   MONOABS 1.3* 11/27/2011 1250   EOSABS 0.0 11/27/2011 1250   BASOSABS 0.1 11/27/2011 1250        02/07/2012   AST 57         ALT 170     ALP 105 11/30/2011   AST  62         ALT 99          ALP 156 11/29/2011   AST 35         ALT 73         ALP 141 11/28/2011    AST 41,         ALT 88,        ALP 138 11/27/2011    AST 57,         ALT 124       ALP 171 CMP     Component Value Date/Time   NA 134* 11/29/2011 0550   K 4.0 11/29/2011 0550   CL 98 11/29/2011 0550   CO2 26 11/29/2011 0550   GLUCOSE 122* 11/29/2011 0550   BUN 13 11/29/2011 0550   CREATININE 1.17 11/29/2011 0550   CREATININE 1.20 07/04/2011 1004   CALCIUM 9.6 11/29/2011 0550   PROT 7.7 02/07/2012 1052   ALBUMIN 4.7 02/07/2012 1052   AST 57* 02/07/2012 1052   ALT 170* 02/07/2012 1052   ALKPHOS 105 02/07/2012 1052   BILITOT 0.4 02/07/2012 1052   GFRNONAA 82* 11/29/2011 0550   GFRAA >90 11/29/2011 0550   Hepatic Function Panel     Component  Value Date/Time   PROT 7.7 02/07/2012 1052   ALBUMIN 4.7 02/07/2012 1052   AST 57* 02/07/2012 1052   ALT 170* 02/07/2012 1052   ALKPHOS 105 02/07/2012 1052   BILITOT 0.4 02/07/2012 1052   BILIDIR 0.1 02/07/2012 1052   IBILI 0.3 02/07/2012 1052        Review of Systems see hpi Current Outpatient Prescriptions  Medication Sig Dispense Refill  . Multiple Vitamin (MULTIVITAMIN WITH MINERALS) TABS Take 1 tablet by mouth daily.      Marland Kitchen sulfamethoxazole-trimethoprim (SEPTRA DS) 800-160 MG per tablet Take 1 tablet by mouth 2 (two) times daily.  20 tablet  0  . triamterene-hydrochlorothiazide (MAXZIDE) 75-50 MG per tablet Take 1 tablet by mouth daily.  90 tablet  4   Past Medical History  Diagnosis Date  . Obesity   . Hypertension    Past Surgical History  Procedure Date  . Lithrostripsy 2003  . Broke right femur 1997  . Broke right hand 2003   No Known Allergies      Objective:   Physical Exam Filed Vitals:   03/06/12 1509  BP: 118/70  Pulse: 72  Temp: 98.7 F (37.1 C)  Height: 5' 5.5" (1.664 m)  Weight: 172 lb 3.2 oz (78.109 kg)   Alert and oriented. Skin warm and dry. Oral mucosa is moist.   . Sclera anicteric,  conjunctivae is pink. Thyroid not enlarged. No cervical lymphadenopathy. Lungs clear. Heart regular rate and rhythm.  Abdomen is soft. Bowel sounds are positive. No hepatomegaly. No abdominal masses felt. No tenderness.  No edema to lower extremities.       Assessment:    Elevated liver enzymes. ? Etiology. Could be from recent antibiotics. Hep B and C needs to be ruled out.    Plan:    Hepatic function, Hep B, Hep C antibody.  Further recommendations to follow.

## 2012-03-07 ENCOUNTER — Telehealth (INDEPENDENT_AMBULATORY_CARE_PROVIDER_SITE_OTHER): Payer: Self-pay | Admitting: *Deleted

## 2012-03-07 LAB — HEPATITIS B SURFACE ANTIGEN: Hepatitis B Surface Ag: NEGATIVE

## 2012-03-07 LAB — HEPATIC FUNCTION PANEL
Alkaline Phosphatase: 69 U/L (ref 39–117)
Bilirubin, Direct: 0.1 mg/dL (ref 0.0–0.3)
Indirect Bilirubin: 0.4 mg/dL (ref 0.0–0.9)
Total Bilirubin: 0.5 mg/dL (ref 0.3–1.2)
Total Protein: 7 g/dL (ref 6.0–8.3)

## 2012-03-07 NOTE — Telephone Encounter (Signed)
The patient will need to have lab work in 3 months, noted for April.

## 2012-05-08 ENCOUNTER — Ambulatory Visit (INDEPENDENT_AMBULATORY_CARE_PROVIDER_SITE_OTHER): Payer: Managed Care, Other (non HMO) | Admitting: Internal Medicine

## 2012-05-15 ENCOUNTER — Other Ambulatory Visit (INDEPENDENT_AMBULATORY_CARE_PROVIDER_SITE_OTHER): Payer: Self-pay | Admitting: *Deleted

## 2012-05-15 ENCOUNTER — Encounter (INDEPENDENT_AMBULATORY_CARE_PROVIDER_SITE_OTHER): Payer: Self-pay | Admitting: *Deleted

## 2012-05-15 ENCOUNTER — Telehealth (INDEPENDENT_AMBULATORY_CARE_PROVIDER_SITE_OTHER): Payer: Self-pay | Admitting: *Deleted

## 2012-05-15 NOTE — Telephone Encounter (Signed)
Lab ordered per Delrae Rend

## 2012-05-29 ENCOUNTER — Ambulatory Visit (INDEPENDENT_AMBULATORY_CARE_PROVIDER_SITE_OTHER): Payer: Managed Care, Other (non HMO) | Admitting: Internal Medicine

## 2012-08-05 ENCOUNTER — Encounter: Payer: Self-pay | Admitting: Family Medicine

## 2012-08-05 ENCOUNTER — Ambulatory Visit: Payer: Managed Care, Other (non HMO) | Admitting: Family Medicine

## 2012-08-05 ENCOUNTER — Ambulatory Visit (INDEPENDENT_AMBULATORY_CARE_PROVIDER_SITE_OTHER): Payer: Managed Care, Other (non HMO) | Admitting: Family Medicine

## 2012-08-05 VITALS — BP 120/80 | HR 78 | Resp 18 | Ht 65.0 in | Wt 166.1 lb

## 2012-08-05 DIAGNOSIS — R6889 Other general symptoms and signs: Secondary | ICD-10-CM

## 2012-08-05 DIAGNOSIS — Z139 Encounter for screening, unspecified: Secondary | ICD-10-CM

## 2012-08-05 DIAGNOSIS — E785 Hyperlipidemia, unspecified: Secondary | ICD-10-CM

## 2012-08-05 DIAGNOSIS — Z1211 Encounter for screening for malignant neoplasm of colon: Secondary | ICD-10-CM

## 2012-08-05 DIAGNOSIS — I10 Essential (primary) hypertension: Secondary | ICD-10-CM

## 2012-08-05 DIAGNOSIS — E663 Overweight: Secondary | ICD-10-CM

## 2012-08-05 DIAGNOSIS — R7989 Other specified abnormal findings of blood chemistry: Secondary | ICD-10-CM

## 2012-08-05 DIAGNOSIS — R5381 Other malaise: Secondary | ICD-10-CM

## 2012-08-05 NOTE — Patient Instructions (Addendum)
Physical  in 6 month,call me if you need me before  CBC, chem 7, lipids, TSh, vit D  Blood pressure is excellent, no med change  It is important that you exercise regularly at least 30 minutes 5 times a week. If you develop chest pain, have severe difficulty breathing, or feel very tired, stop exercising immediately and seek medical attention   Congrats and all the very best to you on your marriage

## 2012-08-05 NOTE — Progress Notes (Signed)
  Subjective:    Patient ID: Devon Macdonald, male    DOB: 1981/01/29, 32 y.o.   MRN: [id number]  HPI The PT is here for follow up and re-evaluation of chronic medical conditions, medication management and review of any available recent lab and radiology data.  Preventive health is updated, specifically   Immunization.  Has upcoming wedding next month, and he is excited about this, in with his fiancee   The PT denies any adverse reactions to current medications since the last visit.  There are no new concerns.  There are no specific complaints       Review of Systems See HPI Denies recent fever or chills. Denies sinus pressure, nasal congestion, ear pain or sore throat. Denies chest congestion, productive cough or wheezing. Denies chest pains, palpitations and leg swelling Denies abdominal pain, nausea, vomiting,diarrhea or constipation.   Denies dysuria, frequency, hesitancy or incontinence. Denies joint pain, swelling and limitation in mobility. Denies headaches, seizures, numbness, or tingling. Denies depression, anxiety or insomnia. Denies skin break down or rash.        Objective:   Physical Exam Patient alert and oriented and in no cardiopulmonary distress.  HEENT: No facial asymmetry, EOMI, no sinus tenderness,  oropharynx pink and moist.  Neck supple no adenopathy.  Chest: Clear to auscultation bilaterally.  CVS: S1, S2 no murmurs, no S3.  ABD: Soft non tender. Bowel sounds normal.  Ext: No edema  MS: Adequate ROM spine, shoulders, hips and knees.  Skin: Intact, no ulcerations or rash noted.  Psych: Good eye contact, normal affect. Memory intact not anxious or depressed appearing.  CNS: CN 2-12 intact, power, tone and sensation normal throughout.        Assessment & Plan:

## 2012-08-06 NOTE — Assessment & Plan Note (Signed)
Improved. Pt applauded on succesful weight loss through lifestyle change, and encouraged to continue same. Weight loss goal set for the next several months.  

## 2012-08-06 NOTE — Assessment & Plan Note (Signed)
Controlled, no change in medication DASH diet and commitment to daily physical activity for a minimum of 30 minutes discussed and encouraged, as a part of hypertension management. The importance of attaining a healthy weight is also discussed.  

## 2012-08-06 NOTE — Assessment & Plan Note (Signed)
Hyperlipidemia:Low fat diet discussed and encouraged.  Updated lab for next visit 

## 2012-08-07 ENCOUNTER — Ambulatory Visit: Payer: Managed Care, Other (non HMO) | Admitting: Family Medicine

## 2012-09-01 ENCOUNTER — Telehealth: Payer: Self-pay | Admitting: Family Medicine

## 2012-09-02 ENCOUNTER — Other Ambulatory Visit: Payer: Self-pay | Admitting: Family Medicine

## 2012-09-02 MED ORDER — ANTIPYRINE-BENZOCAINE 5.4-1.4 % OT SOLN: 3.0000 [drp] | OTIC | Status: DC | PRN

## 2012-09-02 NOTE — Telephone Encounter (Signed)
Auralgan sent to his pharmacy, states use in both ears, if only one ear is bothering him, then he needs to use only in affected ear, pls let him know

## 2012-09-02 NOTE — Telephone Encounter (Signed)
Message left for patient on cell voicemail  

## 2012-12-25 ENCOUNTER — Other Ambulatory Visit: Payer: Self-pay

## 2012-12-29 ENCOUNTER — Telehealth: Payer: Self-pay | Admitting: Family Medicine

## 2012-12-29 DIAGNOSIS — I10 Essential (primary) hypertension: Secondary | ICD-10-CM

## 2012-12-29 MED ORDER — TRIAMTERENE-HCTZ 75-50 MG PO TABS: 1.0000 | ORAL_TABLET | Freq: Every day | ORAL | Status: DC

## 2012-12-29 NOTE — Telephone Encounter (Signed)
Med sent to new pharmacy

## 2013-01-16 ENCOUNTER — Emergency Department (HOSPITAL_BASED_OUTPATIENT_CLINIC_OR_DEPARTMENT_OTHER)
Admission: EM | Admit: 2013-01-16 | Discharge: 2013-01-16 | Disposition: A | Discharge status: Home / Self Care | Payer: Worker's Compensation | Attending: Emergency Medicine | Admitting: Emergency Medicine

## 2013-01-16 ENCOUNTER — Emergency Department (HOSPITAL_BASED_OUTPATIENT_CLINIC_OR_DEPARTMENT_OTHER): Payer: Worker's Compensation

## 2013-01-16 ENCOUNTER — Encounter (HOSPITAL_BASED_OUTPATIENT_CLINIC_OR_DEPARTMENT_OTHER): Payer: Self-pay | Admitting: Emergency Medicine

## 2013-01-16 DIAGNOSIS — S9002XA Contusion of left ankle, initial encounter: Secondary | ICD-10-CM

## 2013-01-16 DIAGNOSIS — Z79899 Other long term (current) drug therapy: Secondary | ICD-10-CM | POA: Insufficient documentation

## 2013-01-16 DIAGNOSIS — Y990 Civilian activity done for income or pay: Secondary | ICD-10-CM | POA: Insufficient documentation

## 2013-01-16 DIAGNOSIS — W230XXA Caught, crushed, jammed, or pinched between moving objects, initial encounter: Secondary | ICD-10-CM | POA: Insufficient documentation

## 2013-01-16 DIAGNOSIS — S9000XA Contusion of unspecified ankle, initial encounter: Secondary | ICD-10-CM | POA: Insufficient documentation

## 2013-01-16 DIAGNOSIS — Y9389 Activity, other specified: Secondary | ICD-10-CM | POA: Insufficient documentation

## 2013-01-16 DIAGNOSIS — I10 Essential (primary) hypertension: Secondary | ICD-10-CM | POA: Insufficient documentation

## 2013-01-16 DIAGNOSIS — Y9289 Other specified places as the place of occurrence of the external cause: Secondary | ICD-10-CM | POA: Insufficient documentation

## 2013-01-16 DIAGNOSIS — E669 Obesity, unspecified: Secondary | ICD-10-CM | POA: Insufficient documentation

## 2013-01-16 MED ORDER — HYDROCODONE-ACETAMINOPHEN 5-325 MG PO TABS: 1.0000 | ORAL_TABLET | Freq: Four times a day (QID) | ORAL | Status: DC | PRN

## 2013-01-16 NOTE — ED Provider Notes (Signed)
The patient was seen in conjunction with the Resident Physician, Dr. Ermalinda Memos.  The documentation is an accurate reflection of the patient encounter.  On my exam, the patient was in no distress, with mild pain about the lateral malleolus.  We reviewed the x-rays, I agree with the interpretation, and the patient was discharged in stable condition.  Gerhard Munch, MD 01/16/13 1242

## 2013-01-16 NOTE — ED Notes (Signed)
I placed an ASO on the patient's left ankle. No place on the charge capture to charge for an ASO.

## 2013-01-16 NOTE — ED Provider Notes (Signed)
CSN: [id number]     Arrival date & time 01/16/13  1050 History   First MD Initiated Contact with Patient 01/16/13 1052     Chief Complaint  Patient presents with  . Ankle Pain   (Consider location/radiation/quality/duration/timing/severity/associated sxs/prior Treatment) Patient is a 32 y.o. male presenting with ankle pain.  Ankle Pain Associated symptoms: no back pain and no fever     32 year old male here with left ankle pain after injury at work 3 days ago. He states and he said he crushed his ankle between a palate and palate jack. Since that time he's had swelling and pain at the site. He's tried rest, ice, compression, and elevation which have helped minimally. The pain is described as a constant 7/10 dull pain worse over his lateral malleolus without radiation. Worsened by standing on it in palpation and alleviated minimally the measures above. He denies numbness or coolness in that foot.  Past Medical History  Diagnosis Date  . Obesity   . Hypertension    Past Surgical History  Procedure Laterality Date  . Lithrostripsy  2003  . Broke right femur  1997  . Broke right hand  2003   Family History  Problem Relation Age of Onset  . Diabetes Mother   . Diabetes Father    History  Substance Use Topics  . Smoking status: Never Smoker   . Smokeless tobacco: Not on file  . Alcohol Use: No    Review of Systems  Constitutional: Negative for fever, chills and diaphoresis.  HENT: Negative for sore throat.   Eyes: Negative for visual disturbance.  Respiratory: Negative for cough and shortness of breath.   Gastrointestinal: Negative for nausea, vomiting, abdominal pain and diarrhea.  Genitourinary: Negative for dysuria.  Musculoskeletal: Positive for arthralgias. Negative for back pain.  Skin: Negative for rash.  Neurological: Negative for headaches.    Allergies  Review of patient's allergies indicates no known allergies.  Home Medications   Current Outpatient Rx   Name  Route  Sig  Dispense  Refill  . antipyrine-benzocaine (AURALGAN) otic solution   Both Ears   Place 3 drops into both ears every 2 (two) hours as needed for pain.   10 mL   0   . HYDROcodone-acetaminophen (NORCO) 5-325 MG per tablet   Oral   Take 1 tablet by mouth every 6 (six) hours as needed for moderate pain.   30 tablet   0   . Multiple Vitamin (MULTIVITAMIN WITH MINERALS) TABS   Oral   Take 1 tablet by mouth daily.         Marland Kitchen triamterene-hydrochlorothiazide (MAXZIDE) 75-50 MG per tablet   Oral   Take 1 tablet by mouth daily.   90 tablet   0     Dose increase effective 03/13/2010    BP 121/80  Pulse 79  Temp(Src) 98.2 F (36.8 C) (Oral)  Resp 16  Ht 5\' 5"  (1.651 m)  Wt 150 lb (68.04 kg)  BMI 24.96 kg/m2  SpO2 100% Physical Exam  Constitutional: He is oriented to person, place, and time. He appears well-developed and well-nourished. No distress.  HENT:  Head: Normocephalic and atraumatic.  Eyes: EOM are normal. Pupils are equal, round, and reactive to light.  Neck: Normal range of motion. Neck supple.  Cardiovascular: Normal rate, regular rhythm and normal heart sounds.   Pulmonary/Chest: Effort normal and breath sounds normal. No respiratory distress. He has no wheezes.  Abdominal: Soft. Bowel sounds are normal. There is no tenderness.  Musculoskeletal: He exhibits no edema.       Left ankle: Tenderness. Lateral malleolus tenderness found. No medial malleolus, no AITFL, no CF ligament, no posterior TFL, no head of 5th metatarsal and no proximal fibula tenderness found.  Neurological: He is alert and oriented to person, place, and time.  Skin: Skin is warm and dry. He is not diaphoretic.  Psychiatric: He has a normal mood and affect.    ED Course  Procedures (including critical care time) Labs Review Labs Reviewed - No data to display Imaging Review Dg Ankle Complete Left  01/16/2013   CLINICAL DATA:  History of trauma  EXAM: LEFT ANKLE COMPLETE -  3+ VIEW  FINDINGS: There is no evidence of fracture, dislocation, or joint effusion. There is no evidence of arthropathy or other focal bone abnormality. Soft tissues are unremarkable.  IMPRESSION: Negative.   Electronically Signed   By: Salome Holmes M.D.   On: 01/16/2013 11:37    EKG Interpretation   None       MDM   1. Ankle contusion, left, initial encounter    32 year old male here with left ankle contusion after crush injury. Plain films rule out fracture. We'll treat supportively with rest, ice, compression, elevation. We'll place ASO brace here. Given prescription for Norco. Also recommend followup with sports med if symptoms don't resolve in 5-7 days.  Red flags reviewed Return for worsening symptoms  Murtis Sink, MD [facility] Resident, PGY-2 01/16/2013, 12:10 PM       Elenora Gamma, MD 01/16/13 [phone]

## 2013-01-16 NOTE — ED Notes (Signed)
Patient injured his left ankle at work on Tuesday.  States that swelling has not gone down and he is concern about having injury or fracture

## 2013-02-04 ENCOUNTER — Encounter: Payer: Managed Care, Other (non HMO) | Admitting: Family Medicine

## 2013-02-20 ENCOUNTER — Encounter: Payer: Self-pay | Admitting: Family Medicine

## 2013-02-20 ENCOUNTER — Ambulatory Visit (INDEPENDENT_AMBULATORY_CARE_PROVIDER_SITE_OTHER): Payer: Managed Care, Other (non HMO) | Admitting: Family Medicine

## 2013-02-20 VITALS — BP 122/80 | HR 83 | Resp 16 | Ht 65.0 in | Wt 153.1 lb

## 2013-02-20 DIAGNOSIS — R6889 Other general symptoms and signs: Secondary | ICD-10-CM

## 2013-02-20 DIAGNOSIS — Z0000 Encounter for general adult medical examination without abnormal findings: Secondary | ICD-10-CM

## 2013-02-20 DIAGNOSIS — R7989 Other specified abnormal findings of blood chemistry: Secondary | ICD-10-CM

## 2013-02-20 DIAGNOSIS — E785 Hyperlipidemia, unspecified: Secondary | ICD-10-CM

## 2013-02-20 DIAGNOSIS — Z23 Encounter for immunization: Secondary | ICD-10-CM

## 2013-02-20 DIAGNOSIS — I10 Essential (primary) hypertension: Secondary | ICD-10-CM

## 2013-02-20 NOTE — Progress Notes (Signed)
   Subjective:    Patient ID: Devon Macdonald, male    DOB: June 12, 1980, 33 y.o.   MRN: [id number]  HPI  Pt in for annual exam Feels well, no concerns or complaints .   Review of Systems    See HPI Denies recent fever or chills. Denies sinus pressure, nasal congestion, ear pain or sore throat. Denies chest congestion, productive cough or wheezing. Denies chest pains, palpitations and leg swelling Denies abdominal pain, nausea, vomiting,diarrhea or constipation.   Denies dysuria, frequency, hesitancy or incontinence. Denies joint pain, swelling and limitation in mobility. Denies headaches, seizures, numbness, or tingling. Denies depression, anxiety or insomnia. Denies skin break down or rash.     Objective:   Physical Exam  Pleasant well nourished male, alert and oriented x 3, in no cardio-pulmonary distress. Afebrile. HEENT No facial trauma or asymetry. Sinuses non tender. EOMI, PERTL. External ears normal, tympanic membranes clear. Oropharynx moist, no exudate, good dentition. Neck: supple, no adenopathy,JVD or thyromegaly.No bruits.  Chest: Clear to ascultation bilaterally.No crackles or wheezes. Non tender to palpation  Breast: No asymetry,no masses. No nipple discharge or inversion. No axillary or supraclavicular adenopathy  Cardiovascular system; Heart sounds normal,  S1 and  S2 ,no S3.  No murmur, or thrill. Apical beat not displaced Peripheral pulses normal.  Abdomen: Soft, non tender, no organomegaly or masses. No bruits. Bowel sounds normal. No guarding, tenderness or rebound.   Musculoskeletal exam: Full ROM of spine, hips , shoulders and knees. No deformity ,swelling or crepitus noted. No muscle wasting or atrophy.   Neurologic: Cranial nerves 2 to 12 intact. Power, tone ,sensation and reflexes normal throughout. No disturbance in gait. No tremor.  Skin: Intact, no ulceration, erythema , scaling or rash noted. Pigmentation normal  throughout  Psych; Normal mood and affect. Judgement and concentration normal        Assessment & Plan:

## 2013-02-20 NOTE — Patient Instructions (Signed)
F/u in 6 month, call if you need me before  Continue to keep active and to eat healthily please  Flu vaccine today.  CBC, fasting lipid and cmp and TSH as soon as possible any [facility] [place]. ([facility])

## 2013-02-21 DIAGNOSIS — Z0000 Encounter for general adult medical examination without abnormal findings: Secondary | ICD-10-CM | POA: Insufficient documentation

## 2013-02-21 NOTE — Assessment & Plan Note (Signed)
Exam as documented Flu vaccine administered. Pt encouraged to , and applauded on healthy habits.

## 2013-03-05 ENCOUNTER — Telehealth: Payer: Self-pay

## 2013-03-05 DIAGNOSIS — R110 Nausea: Secondary | ICD-10-CM

## 2013-03-05 DIAGNOSIS — R140 Abdominal distension (gaseous): Secondary | ICD-10-CM

## 2013-03-05 NOTE — Telephone Encounter (Signed)
Pt states for past week he has been having indigestion and excess burping and feeling like something was sitting on his stomach. Has been using tums with no relief. Wants to know if something can be called into his pharmacy. Please advise

## 2013-03-05 NOTE — Telephone Encounter (Signed)
I will send in protonix or similar drug see if this helps.I also suggest he have an [place] to check for gallstones let me know if he is willing. pls send in protonix 20mg  one daily #30 no refill after you speak with him

## 2013-03-06 ENCOUNTER — Other Ambulatory Visit: Payer: Self-pay

## 2013-03-06 MED ORDER — PANTOPRAZOLE SODIUM 20 MG PO TBEC: 20.0000 mg | DELAYED_RELEASE_TABLET | Freq: Every day | ORAL | Status: DC

## 2013-03-06 NOTE — Addendum Note (Signed)
Addended by: Syliva OvermanSIMPSON, MARGARET E on: 03/06/2013 11:05 AM   Modules accepted: Orders

## 2013-03-06 NOTE — Telephone Encounter (Signed)
Noted  

## 2013-03-06 NOTE — Telephone Encounter (Signed)
Left message that med was sent and to call back if US wanted

## 2013-03-06 NOTE — Telephone Encounter (Signed)
Patient would like to get US scheduled

## 2013-03-11 ENCOUNTER — Ambulatory Visit (HOSPITAL_COMMUNITY): Payer: Managed Care, Other (non HMO)

## 2013-03-13 ENCOUNTER — Ambulatory Visit (HOSPITAL_COMMUNITY)
Admission: RE | Admit: 2013-03-13 | Discharge: 2013-03-13 | Disposition: A | Discharge status: Home / Self Care | Payer: Managed Care, Other (non HMO) | Source: Ambulatory Visit | Attending: Family Medicine | Admitting: Family Medicine

## 2013-03-13 DIAGNOSIS — R110 Nausea: Secondary | ICD-10-CM

## 2013-03-13 DIAGNOSIS — R140 Abdominal distension (gaseous): Secondary | ICD-10-CM

## 2013-03-13 DIAGNOSIS — R1011 Right upper quadrant pain: Secondary | ICD-10-CM | POA: Insufficient documentation

## 2013-03-15 ENCOUNTER — Other Ambulatory Visit: Payer: Self-pay | Admitting: Family Medicine

## 2013-03-15 DIAGNOSIS — R109 Unspecified abdominal pain: Secondary | ICD-10-CM

## 2013-03-15 DIAGNOSIS — R140 Abdominal distension (gaseous): Secondary | ICD-10-CM

## 2013-03-16 ENCOUNTER — Telehealth: Payer: Self-pay | Admitting: Family Medicine

## 2013-03-16 ENCOUNTER — Encounter: Payer: Self-pay | Admitting: Gastroenterology

## 2013-03-16 NOTE — Telephone Encounter (Signed)
Message not seen can you let me know what it is please

## 2013-03-18 NOTE — Telephone Encounter (Signed)
pls write requested statement and give it to referral staff or fax where needed, pls stamp my sig , thanks

## 2013-03-18 NOTE — Telephone Encounter (Signed)
Letter composed and given to referral staff.

## 2013-03-19 ENCOUNTER — Encounter (HOSPITAL_COMMUNITY)
Admission: RE | Admit: 2013-03-19 | Discharge: 2013-03-19 | Disposition: A | Discharge status: Home / Self Care | Payer: Managed Care, Other (non HMO) | Source: Ambulatory Visit | Attending: Family Medicine | Admitting: Family Medicine

## 2013-03-19 ENCOUNTER — Encounter (HOSPITAL_COMMUNITY): Payer: Self-pay

## 2013-03-19 DIAGNOSIS — R109 Unspecified abdominal pain: Secondary | ICD-10-CM | POA: Insufficient documentation

## 2013-03-19 DIAGNOSIS — R142 Eructation: Secondary | ICD-10-CM | POA: Insufficient documentation

## 2013-03-19 DIAGNOSIS — R140 Abdominal distension (gaseous): Secondary | ICD-10-CM

## 2013-03-19 DIAGNOSIS — R143 Flatulence: Secondary | ICD-10-CM

## 2013-03-19 DIAGNOSIS — R141 Gas pain: Secondary | ICD-10-CM | POA: Insufficient documentation

## 2013-03-19 MED ORDER — STERILE WATER FOR INJECTION IJ SOLN
INTRAMUSCULAR | Status: AC
  Administered 2013-03-19: 1.39 mL via INTRAVENOUS
  Filled 2013-03-19: qty 10

## 2013-03-19 MED ORDER — SINCALIDE 5 MCG IJ SOLR
INTRAMUSCULAR | Status: AC
  Administered 2013-03-19: 1.39 ug via INTRAVENOUS
  Filled 2013-03-19: qty 5

## 2013-03-19 MED ORDER — TECHNETIUM TC 99M MEBROFENIN IV KIT
5.0000 | PACK | Freq: Once | INTRAVENOUS | Status: AC | PRN
  Administered 2013-03-19: 5 via INTRAVENOUS

## 2013-03-25 ENCOUNTER — Other Ambulatory Visit: Payer: Self-pay

## 2013-03-25 DIAGNOSIS — I10 Essential (primary) hypertension: Secondary | ICD-10-CM

## 2013-03-25 MED ORDER — TRIAMTERENE-HCTZ 75-50 MG PO TABS: 1.0000 | ORAL_TABLET | Freq: Every day | ORAL | Status: DC

## 2013-04-08 ENCOUNTER — Ambulatory Visit: Payer: Self-pay | Admitting: Gastroenterology

## 2013-04-13 ENCOUNTER — Encounter: Payer: Self-pay | Admitting: Internal Medicine

## 2013-04-14 ENCOUNTER — Ambulatory Visit: Payer: Self-pay | Admitting: Internal Medicine

## 2013-04-21 ENCOUNTER — Telehealth: Payer: Self-pay | Admitting: Family Medicine

## 2013-04-21 DIAGNOSIS — R7989 Other specified abnormal findings of blood chemistry: Secondary | ICD-10-CM

## 2013-04-21 DIAGNOSIS — E785 Hyperlipidemia, unspecified: Secondary | ICD-10-CM

## 2013-04-21 DIAGNOSIS — I10 Essential (primary) hypertension: Secondary | ICD-10-CM

## 2013-04-21 NOTE — Telephone Encounter (Signed)
pls contact opt let him know his ins sent a msg, he needs fasting lipids , has not had in over 2 years pls order and he needs thia at nearest [facility] asap

## 2013-04-21 NOTE — Telephone Encounter (Signed)
Note he also needs fasting chem 7 TSH and CBc when he gets the labs

## 2013-04-22 NOTE — Telephone Encounter (Signed)
Called and left message notifying patient the he needs to have labs done asap due to correspondence from his insurance.  Labs ordered and mailed to listed address.  Requested call back from patient.

## 2013-04-22 NOTE — Addendum Note (Signed)
Addended by: Kandis FantasiaSLADE, Pike Scantlebury B on: 04/22/2013 10:18 AM   Modules accepted: Orders

## 2013-05-26 ENCOUNTER — Ambulatory Visit: Payer: Self-pay | Admitting: Internal Medicine

## 2013-08-28 ENCOUNTER — Ambulatory Visit: Payer: Managed Care, Other (non HMO) | Admitting: Family Medicine

## 2013-09-20 ENCOUNTER — Other Ambulatory Visit: Payer: Self-pay | Admitting: Family Medicine

## 2013-09-21 ENCOUNTER — Other Ambulatory Visit: Payer: Self-pay | Admitting: Family Medicine

## 2013-09-22 ENCOUNTER — Telehealth: Payer: Self-pay

## 2013-09-22 ENCOUNTER — Other Ambulatory Visit: Payer: Self-pay

## 2013-09-22 MED ORDER — IBUPROFEN 800 MG PO TABS: 800.0000 mg | ORAL_TABLET | Freq: Two times a day (BID) | ORAL | Status: DC | PRN

## 2013-09-22 NOTE — Telephone Encounter (Signed)
Pt states that for the pasty 5 days he has been having bad migraines on the left side of his head. No vision/neurologic problems. Thinks its from the herniated discs in his neck. Has appt Aug 17th and wants to know is there anything that can be called in for him that won't make him drowsy while he's at work. Please advise

## 2013-09-22 NOTE — Telephone Encounter (Signed)
Pt aware and med sent  

## 2013-09-22 NOTE — Telephone Encounter (Signed)
pls erx and advise ibuprofen 800mg  one twice daily as needed for uncontrolled headache #20 only , no refill  Let him know take only one ibuprofen per day i needed and can take tylenol 500mg  one with that as well

## 2013-10-05 ENCOUNTER — Encounter: Payer: Self-pay | Admitting: Family Medicine

## 2013-10-05 ENCOUNTER — Ambulatory Visit (INDEPENDENT_AMBULATORY_CARE_PROVIDER_SITE_OTHER): Payer: Managed Care, Other (non HMO) | Admitting: Family Medicine

## 2013-10-05 VITALS — BP 104/80 | HR 86 | Resp 18 | Ht 65.0 in | Wt 152.1 lb

## 2013-10-05 DIAGNOSIS — R519 Headache, unspecified: Secondary | ICD-10-CM | POA: Insufficient documentation

## 2013-10-05 DIAGNOSIS — R51 Headache: Secondary | ICD-10-CM

## 2013-10-05 DIAGNOSIS — I10 Essential (primary) hypertension: Secondary | ICD-10-CM

## 2013-10-05 LAB — CBC
HCT: 45.6 % (ref 39.0–52.0)
Hemoglobin: 15.4 g/dL (ref 13.0–17.0)
MCH: 28.4 pg (ref 26.0–34.0)
MCHC: 33.8 g/dL (ref 30.0–36.0)
MCV: 84.1 fL (ref 78.0–100.0)
PLATELETS: 316 10*3/uL (ref 150–400)
RBC: 5.42 MIL/uL (ref 4.22–5.81)
RDW: 13.8 % (ref 11.5–15.5)
WBC: 5.6 10*3/uL (ref 4.0–10.5)

## 2013-10-05 MED ORDER — BUTALBITAL-APAP-CAFFEINE 50-325-40 MG PO TABS: ORAL_TABLET | ORAL | Status: DC

## 2013-10-05 NOTE — Patient Instructions (Addendum)
Annual wellness in  Mid to end January    Flu vaccine available in septemeber , walk in    [place] uncontrolled headaches  Blood pressure is good  Lab today CBc, and chem 7     Congrats on upcoming daughter

## 2013-10-05 NOTE — Progress Notes (Signed)
   Subjective:    Patient ID: Devon CloseMaurice K Uhls Jr., male    DOB: [birth year], 33 y.o.   MRN: [id number]  HPI The PT is here for follow up and re-evaluation of chronic medical conditions, medication management and review of any available recent lab and radiology data.  Preventive health is updated, specifically  Cancer screening and Immunization.   The PT denies any adverse reactions to current medications since the last visit.  There are no new concerns.  There are no specific complaints       Review of Systems See HPI Denies recent fever or chills. Denies sinus pressure, nasal congestion, ear pain or sore throat. Denies chest congestion, productive cough or wheezing. Denies chest pains, palpitations and leg swelling Denies abdominal pain, nausea, vomiting,diarrhea or constipation.   Denies dysuria, frequency, hesitancy or incontinence. Denies joint pain, swelling and limitation in mobility. Denies headaches, seizures, numbness, or tingling. Denies depression, anxiety or insomnia. Denies skin break down or rash.        Objective:   Physical Exam BP 104/80  Pulse 86  Resp 18  Ht 5\' 5"  (1.651 m)  Wt 152 lb 1.9 oz (69.001 kg)  BMI 25.31 kg/m2  SpO2 99% Patient alert and oriented and in no cardiopulmonary distress.  HEENT: No facial asymmetry, EOMI,   oropharynx pink and moist.  Neck supple no JVD, no mass.  Chest: Clear to auscultation bilaterally.  CVS: S1, S2 no murmurs, no S3.Regular rate.  ABD: Soft non tender.   Ext: No edema  MS: Adequate ROM spine, shoulders, hips and knees.  Skin: Intact, no ulcerations or rash noted.  Psych: Good eye contact, normal affect. Memory intact not anxious or depressed appearing.  CNS: CN 2-12 intact, power,  normal throughout.no focal deficits noted.        Assessment & Plan:  Headache(784.0) Improved, respond spot fioricert as needed, emphasis of limiting use of fioricet to prevent dependence and  rebound  HYPERTENSION Controlled, no change in medication DASH diet and commitment to daily physical activity for a minimum of 30 minutes discussed and encouraged, as a part of hypertension management. The importance of attaining a healthy weight is also discussed.

## 2013-10-06 ENCOUNTER — Encounter: Payer: Self-pay | Admitting: Family Medicine

## 2013-10-06 LAB — BASIC METABOLIC PANEL
BUN: 17 mg/dL (ref 6–23)
CO2: 27 mEq/L (ref 19–32)
Calcium: 10.1 mg/dL (ref 8.4–10.5)
Chloride: 95 mEq/L — ABNORMAL LOW (ref 96–112)
Creat: 1.1 mg/dL (ref 0.50–1.35)
Glucose, Bld: 70 mg/dL (ref 70–99)
Potassium: 4.4 mEq/L (ref 3.5–5.3)
SODIUM: 133 meq/L — AB (ref 135–145)

## 2013-10-11 NOTE — Assessment & Plan Note (Signed)
Controlled, no change in medication DASH diet and commitment to daily physical activity for a minimum of 30 minutes discussed and encouraged, as a part of hypertension management. The importance of attaining a healthy weight is also discussed.  

## 2013-10-11 NOTE — Assessment & Plan Note (Signed)
Improved, respond spot fioricert as needed, emphasis of limiting use of fioricet to prevent dependence and rebound

## 2013-12-17 ENCOUNTER — Other Ambulatory Visit: Payer: Self-pay | Admitting: Family Medicine

## 2013-12-17 ENCOUNTER — Ambulatory Visit (INDEPENDENT_AMBULATORY_CARE_PROVIDER_SITE_OTHER): Payer: Managed Care, Other (non HMO)

## 2013-12-17 DIAGNOSIS — Z23 Encounter for immunization: Secondary | ICD-10-CM

## 2013-12-17 LAB — CBC
HEMATOCRIT: 44.8 % (ref 39.0–52.0)
HEMOGLOBIN: 15.3 g/dL (ref 13.0–17.0)
MCH: 28.2 pg (ref 26.0–34.0)
MCHC: 34.2 g/dL (ref 30.0–36.0)
MCV: 82.7 fL (ref 78.0–100.0)
Platelets: 327 10*3/uL (ref 150–400)
RBC: 5.42 MIL/uL (ref 4.22–5.81)
RDW: 13.7 % (ref 11.5–15.5)
WBC: 3.9 10*3/uL — ABNORMAL LOW (ref 4.0–10.5)

## 2013-12-18 LAB — LIPID PANEL
CHOL/HDL RATIO: 2.7 ratio
CHOLESTEROL: 200 mg/dL (ref 0–200)
HDL: 74 mg/dL (ref >39)
LDL CALC: 114 mg/dL — AB (ref 0–99)
Triglycerides: 59 mg/dL (ref <150)
VLDL: 12 mg/dL (ref 0–40)

## 2013-12-18 LAB — BASIC METABOLIC PANEL
BUN: 14 mg/dL (ref 6–23)
CHLORIDE: 98 meq/L (ref 96–112)
CO2: 31 meq/L (ref 19–32)
Calcium: 9.5 mg/dL (ref 8.4–10.5)
Creat: 1.02 mg/dL (ref 0.50–1.35)
GLUCOSE: 90 mg/dL (ref 70–99)
POTASSIUM: 4.1 meq/L (ref 3.5–5.3)
Sodium: 137 mEq/L (ref 135–145)

## 2013-12-18 LAB — TSH: TSH: 1.906 u[IU]/mL (ref 0.350–4.500)

## 2013-12-24 ENCOUNTER — Other Ambulatory Visit: Payer: Self-pay | Admitting: Family Medicine

## 2014-01-18 ENCOUNTER — Telehealth: Payer: Self-pay | Admitting: Family Medicine

## 2014-01-18 DIAGNOSIS — J209 Acute bronchitis, unspecified: Secondary | ICD-10-CM

## 2014-01-18 MED ORDER — AZITHROMYCIN 250 MG PO TABS: ORAL_TABLET | ORAL | Status: AC

## 2014-01-18 MED ORDER — BENZONATATE 100 MG PO CAPS: 100.0000 mg | ORAL_CAPSULE | Freq: Two times a day (BID) | ORAL | Status: DC

## 2014-01-18 NOTE — Telephone Encounter (Signed)
pls send in z pack , tessalon perles 100mg  twice daily # 14.and let him know Advise fluids, cover cough and habd washing Congrats on baby

## 2014-01-18 NOTE — Addendum Note (Signed)
Addended by: Kandis FantasiaSLADE, COURTNEY B on: 01/18/2014 05:29 PM   Modules accepted: Orders

## 2014-01-18 NOTE — Telephone Encounter (Signed)
Patient states that he has had congestion, cough with green sputum x 3 days.  Wife is due to have baby.  Please advise.

## 2014-01-19 NOTE — Telephone Encounter (Signed)
Meds sent

## 2014-01-20 ENCOUNTER — Telehealth: Payer: Self-pay

## 2014-01-20 DIAGNOSIS — Z3009 Encounter for other general counseling and advice on contraception: Secondary | ICD-10-CM

## 2014-01-20 NOTE — Addendum Note (Signed)
Addended by: Abner GreenspanHUDY, Zoua Caporaso H on: 01/20/2014 04:31 PM   Modules accepted: Orders

## 2014-01-20 NOTE — Telephone Encounter (Signed)
Referral entered  

## 2014-01-20 NOTE — Telephone Encounter (Signed)
Wants referral to [facility] for vasectomy. He assured me this is what he wants.

## 2014-01-20 NOTE — Telephone Encounter (Signed)
Refer to urology pls I will sign

## 2014-03-16 ENCOUNTER — Other Ambulatory Visit: Payer: Self-pay

## 2014-03-16 ENCOUNTER — Telehealth: Payer: Self-pay | Admitting: *Deleted

## 2014-03-16 MED ORDER — TRIAMTERENE-HCTZ 75-50 MG PO TABS: 1.0000 | ORAL_TABLET | Freq: Every day | ORAL | Status: DC

## 2014-03-16 NOTE — Telephone Encounter (Signed)
Pt called requesting a 90 day supply on his blood pressure medication. Pt states it needs to be refilled. Please advise

## 2014-03-16 NOTE — Telephone Encounter (Signed)
Refill sent to pharmacy.   

## 2014-03-18 ENCOUNTER — Encounter: Payer: Managed Care, Other (non HMO) | Admitting: Family Medicine

## 2014-05-27 ENCOUNTER — Telehealth: Payer: Self-pay | Admitting: *Deleted

## 2014-05-27 NOTE — Telephone Encounter (Signed)
Pt called stating he thinks he may have a hemorrhoid and pt is requesting something for it. Please advise

## 2014-05-28 NOTE — Telephone Encounter (Signed)
pls address as just discussed

## 2014-05-28 NOTE — Telephone Encounter (Signed)
Had problems with hemorrhoids x 3 weeks. Burning and stinging. Wants something called into [facility].

## 2014-06-02 NOTE — Telephone Encounter (Signed)
Patient aware.

## 2014-06-17 ENCOUNTER — Telehealth: Payer: Self-pay | Admitting: *Deleted

## 2014-06-17 MED ORDER — TRIAMTERENE-HCTZ 75-50 MG PO TABS: 1.0000 | ORAL_TABLET | Freq: Every day | ORAL | Status: DC

## 2014-06-17 NOTE — Telephone Encounter (Signed)
Pt called requesting a refill in his blood pressure medication. Please advise

## 2014-06-23 ENCOUNTER — Ambulatory Visit (INDEPENDENT_AMBULATORY_CARE_PROVIDER_SITE_OTHER): Payer: Managed Care, Other (non HMO) | Admitting: Family Medicine

## 2014-06-23 ENCOUNTER — Encounter: Payer: Self-pay | Admitting: Family Medicine

## 2014-06-23 VITALS — BP 102/68 | HR 68 | Resp 18 | Ht 65.0 in | Wt 155.1 lb

## 2014-06-23 DIAGNOSIS — R05 Cough: Secondary | ICD-10-CM | POA: Diagnosis not present

## 2014-06-23 DIAGNOSIS — M542 Cervicalgia: Secondary | ICD-10-CM | POA: Insufficient documentation

## 2014-06-23 DIAGNOSIS — Z0000 Encounter for general adult medical examination without abnormal findings: Secondary | ICD-10-CM | POA: Diagnosis not present

## 2014-06-23 DIAGNOSIS — J3089 Other allergic rhinitis: Secondary | ICD-10-CM | POA: Diagnosis not present

## 2014-06-23 DIAGNOSIS — M6248 Contracture of muscle, other site: Secondary | ICD-10-CM

## 2014-06-23 DIAGNOSIS — Z01818 Encounter for other preprocedural examination: Secondary | ICD-10-CM

## 2014-06-23 DIAGNOSIS — G4452 New daily persistent headache (NDPH): Secondary | ICD-10-CM

## 2014-06-23 DIAGNOSIS — M62838 Other muscle spasm: Secondary | ICD-10-CM | POA: Insufficient documentation

## 2014-06-23 DIAGNOSIS — I10 Essential (primary) hypertension: Secondary | ICD-10-CM

## 2014-06-23 DIAGNOSIS — E785 Hyperlipidemia, unspecified: Secondary | ICD-10-CM

## 2014-06-23 DIAGNOSIS — Z3009 Encounter for other general counseling and advice on contraception: Secondary | ICD-10-CM

## 2014-06-23 DIAGNOSIS — R058 Other specified cough: Secondary | ICD-10-CM

## 2014-06-23 DIAGNOSIS — R059 Cough, unspecified: Secondary | ICD-10-CM | POA: Insufficient documentation

## 2014-06-23 HISTORY — DX: Cough: R05

## 2014-06-23 HISTORY — DX: Other specified cough: R05.8

## 2014-06-23 MED ORDER — MONTELUKAST SODIUM 10 MG PO TABS: 10.0000 mg | ORAL_TABLET | Freq: Every day | ORAL | Status: DC

## 2014-06-23 MED ORDER — CYCLOBENZAPRINE HCL 5 MG PO TABS: ORAL_TABLET | ORAL | Status: DC

## 2014-06-23 MED ORDER — RANITIDINE HCL 150 MG PO CAPS: 150.0000 mg | ORAL_CAPSULE | Freq: Two times a day (BID) | ORAL | Status: DC

## 2014-06-23 MED ORDER — IBUPROFEN 800 MG PO TABS: ORAL_TABLET | ORAL | Status: DC

## 2014-06-23 MED ORDER — PROMETHAZINE-DM 6.25-15 MG/5ML PO SYRP: ORAL_SOLUTION | ORAL | Status: DC

## 2014-06-23 MED ORDER — MOMETASONE FUROATE 50 MCG/ACT NA SUSP: 2.0000 | Freq: Every day | NASAL | Status: DC

## 2014-06-23 MED ORDER — KETOROLAC TROMETHAMINE 60 MG/2ML IM SOLN
60.0000 mg | Freq: Once | INTRAMUSCULAR | Status: AC
  Administered 2014-06-23: 60 mg via INTRAMUSCULAR

## 2014-06-23 MED ORDER — METHYLPREDNISOLONE ACETATE 80 MG/ML IJ SUSP
80.0000 mg | Freq: Once | INTRAMUSCULAR | Status: AC
  Administered 2014-06-23: 80 mg via INTRAMUSCULAR

## 2014-06-23 MED ORDER — PREDNISONE 5 MG (21) PO TBPK: 5.0000 mg | ORAL_TABLET | ORAL | Status: DC

## 2014-06-23 NOTE — Patient Instructions (Addendum)
F/u in 5.5 month, call if you need me before  Singulair and nasonex are prescribed for daily use for allergies  Cough suppressant syrup at bedtime as needed  For neck pain, anti inflammatories in office , and ibuprofen and prednisone are prescribed  Muscle relaxant flexeril prescribed for left neck spasm  Fasting CBC, lipid, cmp and TSH 10/30 or after

## 2014-06-23 NOTE — Assessment & Plan Note (Addendum)
Uncontrolled, commitment to daily medication and saline nasal flushes twice daily, as needed

## 2014-06-23 NOTE — Progress Notes (Signed)
   Subjective:    Patient ID: Devon CloseMaurice K Hert Jr., male    DOB: 1980/04/10, 34 y.o.   MRN: [id number]  HPI Patient is in for annual physical exam. C/o increased neck pain , radiating to posterior head for the past week, he has established disc disease in his neck which flares intermittently, and currently experiencing acute and uncontrolled pain. C/o uncontrolled allergy symptoms in past 2 to 3 weeks, excess nasal congestion, sneezing , watery eyes and cough. No fever or chills, no sore throat , but constant post nasal drainage. Cough at night, sputum is clear   Review of Systems See HPI     Objective:   Physical Exam BP 102/68 mmHg  Pulse 68  Resp 18  Ht 5\' 5"  (1.651 m)  Wt 155 lb 1.9 oz (70.362 kg)  BMI 25.81 kg/m2  SpO2 99% Pleasant well nourished male, alert and oriented x 3, in no cardio-pulmonary distress. Afebrile. HEENT No facial trauma or asymetry. Sinuses non tender. EOMI, PERTL,  Erythema and edema of nasal mucosa, bilateral conjunctival injection, no purulent drainage External ears normal, cerumen partially occludes both tympanic membranesOropharynx moist, no exudate, good dentition. Neck: decreased ROM, left trapezius spasm, no adenopathy,JVD or thyromegaly.No bruits.  Chest: Clear to ascultation bilaterally.No crackles or wheezes. Non tender to palpation  Breast: No asymetry,no masses. No nipple discharge or inversion. No axillary or supraclavicular adenopathy  Cardiovascular system; Heart sounds normal,  S1 and  S2 ,no S3.  No murmur, or thrill. Apical beat not displaced Peripheral pulses normal.  Abdomen: Soft, non tender, no organomegaly or masses. No bruits. Bowel sounds normal. No guarding, tenderness or rebound.  Rectal:  Not examined GU: Not examined  Musculoskeletal exam: Full ROM of spine, hips , shoulders and knees. No deformity ,swelling or crepitus noted. No muscle wasting or atrophy.   Neurologic: Cranial nerves 2 to 12  intact. Power, tone ,sensation and reflexes normal throughout. No disturbance in gait. No tremor.  Skin: Intact, no ulceration, erythema , scaling or rash noted. Pigmentation normal throughout  Psych; Normal mood and affect. Judgement and concentration normal         Assessment & Plan:  Visit for annual health examination Annual exam as documented. Counseling done  re healthy lifestyle involving commitment to 150 minutes exercise per week, heart healthy diet, and attaining healthy weight.The importance of adequate sleep also discussed. Regular seat belt use and home safety, is also discussed. Changes in health habits are decided on by the patient with goals and time frames  set for achieving them. Immunization and cancer screening needs are specifically addressed at this visit.    Rhinitis, allergic Uncontrolled, commitment to daily medication and saline nasal flushes twice daily, as needed   Neck pain, acute Uncontrolled.Toradol and depo medrol administered IM in the office , to be followed by a short course of oral prednisone and NSAIDS.    Muscle spasms of neck Muscle relaxant for bedtime use as needed, is prescribed   Allergic cough dailky singular for allergic cough and uncontrolled allergies, bedtime coough suppressant as needed, limited supply   Headache Uncontrolled.Toradol and depo medrol administered IM in the office , to be followed by a short course of oral prednisone and NSAIDS. No neurologic deficits on exam

## 2014-06-23 NOTE — Assessment & Plan Note (Signed)

## 2014-06-25 NOTE — Assessment & Plan Note (Signed)
Muscle relaxant for bedtime use as needed, is prescribed

## 2014-06-25 NOTE — Assessment & Plan Note (Signed)
dailky singular for allergic cough and uncontrolled allergies, bedtime coough suppressant as needed, limited supply

## 2014-06-25 NOTE — Assessment & Plan Note (Signed)
Uncontrolled.Toradol and depo medrol administered IM in the office , to be followed by a short course of oral prednisone and NSAIDS.  

## 2014-06-25 NOTE — Assessment & Plan Note (Signed)
Uncontrolled.Toradol and depo medrol administered IM in the office , to be followed by a short course of oral prednisone and NSAIDS. No neurologic deficits on exam

## 2014-08-09 ENCOUNTER — Telehealth: Payer: Self-pay

## 2014-08-09 NOTE — Telephone Encounter (Signed)
Recommend tessalon perles 100mg  twice daily to help cough up the mucus, pred is for allergies explain not needed, pls send in med

## 2014-08-10 MED ORDER — BENZONATATE 100 MG PO CAPS: 100.0000 mg | ORAL_CAPSULE | Freq: Two times a day (BID) | ORAL | Status: DC

## 2014-08-10 NOTE — Addendum Note (Signed)
Addended by: Kandis Fantasia B on: 08/10/2014 01:32 PM   Modules accepted: Orders

## 2014-08-10 NOTE — Telephone Encounter (Signed)
Patient aware and med sent to pharmacy.  

## 2014-09-16 ENCOUNTER — Other Ambulatory Visit: Payer: Self-pay

## 2014-09-16 ENCOUNTER — Telehealth: Payer: Self-pay | Admitting: *Deleted

## 2014-09-16 MED ORDER — TRIAMTERENE-HCTZ 75-50 MG PO TABS: 1.0000 | ORAL_TABLET | Freq: Every day | ORAL | Status: DC

## 2014-09-16 NOTE — Telephone Encounter (Signed)
Med refilled.

## 2014-09-16 NOTE — Telephone Encounter (Signed)
Pt called requesting a refill of his blood pressure medicine in a 90 day supply. Please advise

## 2014-10-21 ENCOUNTER — Ambulatory Visit (INDEPENDENT_AMBULATORY_CARE_PROVIDER_SITE_OTHER): Payer: Managed Care, Other (non HMO) | Admitting: Family Medicine

## 2014-10-21 ENCOUNTER — Encounter: Payer: Self-pay | Admitting: Family Medicine

## 2014-10-21 VITALS — BP 106/74 | HR 114 | Temp 101.5°F | Resp 18 | Ht 65.0 in | Wt 150.0 lb

## 2014-10-21 DIAGNOSIS — R519 Headache, unspecified: Secondary | ICD-10-CM

## 2014-10-21 DIAGNOSIS — M6248 Contracture of muscle, other site: Secondary | ICD-10-CM

## 2014-10-21 DIAGNOSIS — I10 Essential (primary) hypertension: Secondary | ICD-10-CM | POA: Diagnosis not present

## 2014-10-21 DIAGNOSIS — J028 Acute pharyngitis due to other specified organisms: Secondary | ICD-10-CM

## 2014-10-21 DIAGNOSIS — J111 Influenza due to unidentified influenza virus with other respiratory manifestations: Secondary | ICD-10-CM | POA: Diagnosis not present

## 2014-10-21 DIAGNOSIS — R51 Headache: Secondary | ICD-10-CM

## 2014-10-21 DIAGNOSIS — M62838 Other muscle spasm: Secondary | ICD-10-CM

## 2014-10-21 DIAGNOSIS — R509 Fever, unspecified: Secondary | ICD-10-CM

## 2014-10-21 DIAGNOSIS — J029 Acute pharyngitis, unspecified: Secondary | ICD-10-CM | POA: Insufficient documentation

## 2014-10-21 LAB — POCT RAPID STREP A (OFFICE): RAPID STREP A SCREEN: NEGATIVE

## 2014-10-21 MED ORDER — KETOROLAC TROMETHAMINE 60 MG/2ML IM SOLN
60.0000 mg | Freq: Once | INTRAMUSCULAR | Status: AC
  Administered 2014-10-21: 60 mg via INTRAMUSCULAR

## 2014-10-21 MED ORDER — IBUPROFEN 800 MG PO TABS: 800.0000 mg | ORAL_TABLET | Freq: Three times a day (TID) | ORAL | Status: DC | PRN

## 2014-10-21 MED ORDER — OSELTAMIVIR PHOSPHATE 75 MG PO CAPS: 75.0000 mg | ORAL_CAPSULE | Freq: Two times a day (BID) | ORAL | Status: DC

## 2014-10-21 MED ORDER — AZITHROMYCIN 250 MG PO TABS: ORAL_TABLET | ORAL | Status: DC

## 2014-10-21 MED ORDER — CYCLOBENZAPRINE HCL 5 MG PO TABS: 5.0000 mg | ORAL_TABLET | Freq: Every day | ORAL | Status: DC

## 2014-10-21 NOTE — Patient Instructions (Addendum)
F/u in November as before, call if you need me sooner  You are treated for influenza , and also pharyngitis Toradol in office for headache and body aches Work excuse from today to return 10/26/2014  Influenza Influenza (flu) is an infection in the mouth, nose, and throat (respiratory tract) caused by a virus. The flu can make you feel very ill. Influenza spreads easily from person to person (contagious).  HOME CARE   Only take medicines as told by your doctor.  Use a cool mist humidifier to make breathing easier.  Get plenty of rest until your fever goes away. This usually takes 3 to 4 days.  Drink enough fluids to keep your pee (urine) clear or pale yellow.  Cover your mouth and nose when you cough or sneeze.  Wash your hands well to avoid spreading the flu.  Stay home from work or school until your fever has been gone for at least 1 full day.  Get a flu shot every year. GET HELP RIGHT AWAY IF:   You have trouble breathing or feel short of breath.  Your skin or nails turn blue.  You have severe neck pain or stiffness.  You have a severe headache, facial pain, or earache.  Your fever gets worse or keeps coming back.  You feel sick to your stomach (nauseous), throw up (vomit), or have watery poop (diarrhea).  You have chest pain.  You have a deep cough that gets worse, or you cough up more thick spit (mucus). MAKE SURE YOU:   Understand these instructions.  Will watch your condition.  Will get help right away if you are not doing well or get worse. Document Released: 11/15/2007 Document Revised: 06/22/2013 Document Reviewed: 05/07/2011 [facility] Patient Information 2015 [place], [place]. This information is not intended to replace advice given to you by your health care provider. Make sure you discuss any questions you have with your health care provider.   Toradol given in office for headache and pain

## 2014-10-23 DIAGNOSIS — R509 Fever, unspecified: Secondary | ICD-10-CM | POA: Insufficient documentation

## 2014-10-23 NOTE — Assessment & Plan Note (Signed)
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

## 2014-10-23 NOTE — Assessment & Plan Note (Signed)
5 day course of tamiflu and work excuse , fluids and rest

## 2014-10-23 NOTE — Progress Notes (Signed)
   Subjective:    Patient ID: Devon Macdonald., male    DOB: [birth year], 34 y.o.   MRN: [id number]  HPI Acute onset fever, body aches, sore throat and hedache with chills.Also sore throat. No sinus drainage or pressure, no productive cough Infant daughter has strep exposure in day care Feels ill, no energy, poor appetite  Review of Systems See HPI  Denies abdominal pain, nausea, vomiting,diarrhea or constipation.   Denies dysuria, frequency, hesitancy or incontinence. Denies joint pain, swelling and limitation in mobility. Denies headaches, seizures, numbness, or tingling. Denies depression, anxiety or insomnia. Denies skin break down or rash.         Objective:   Physical Exam BP 106/74 mmHg  Pulse 114  Temp(Src) 101.5 F (38.6 C)  Resp 18  Ht  (1.651 m)  Wt 150 lb (68.04 kg)  BMI 24.96 kg/m2  SpO2 97% Patient alert and oriented and in mild cardiopulmonary distress.Ill appearing, weak HEENT: No facial asymmetry, EOMI,   oropharynx pink and moist. No exudate. Neck adequate ROM, bilateral spasm, bilateral anterior cervical adenitis.TM clear bilaterally, good light reflex  Chest: Clear to auscultation bilaterally.  CVS: S1, S2 no murmurs, no S3.Regular rate.  ABD: Soft non tender.   Ext: No edema  MS: Adequate ROM spine, shoulders, hips and knees.  Skin: Intact, no ulcerations or rash noted.  Psych: Good eye contact, normal affect. Memory intact not anxious or depressed appearing.  CNS: CN 2-12 intact, power,  normal throughout.no focal deficits noted.       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

## 2014-10-23 NOTE — Assessment & Plan Note (Signed)
toradol administered at visit 

## 2014-10-23 NOTE — Assessment & Plan Note (Signed)
Controlled, no change in medication  

## 2014-10-23 NOTE — Assessment & Plan Note (Signed)
Flexeril at bedtime as needed , one tablet

## 2014-10-23 NOTE — Assessment & Plan Note (Signed)
Tylenol given at visit , and pt to alternate tylenol with ibuprofen

## 2014-11-17 ENCOUNTER — Other Ambulatory Visit: Payer: Self-pay | Admitting: Family Medicine

## 2014-11-23 ENCOUNTER — Ambulatory Visit: Payer: Self-pay | Admitting: Family Medicine

## 2014-11-28 ENCOUNTER — Other Ambulatory Visit: Payer: Self-pay | Admitting: Family Medicine

## 2014-12-13 ENCOUNTER — Telehealth: Payer: Self-pay | Admitting: *Deleted

## 2014-12-13 MED ORDER — TRIAMTERENE-HCTZ 75-50 MG PO TABS: 1.0000 | ORAL_TABLET | Freq: Every day | ORAL | Status: DC

## 2014-12-13 NOTE — Telephone Encounter (Signed)
Refill sent.

## 2014-12-13 NOTE — Telephone Encounter (Signed)
Patient called requesting blood pressure medication to be refilled in a 90 day supply

## 2014-12-28 ENCOUNTER — Ambulatory Visit (INDEPENDENT_AMBULATORY_CARE_PROVIDER_SITE_OTHER): Payer: Managed Care, Other (non HMO) | Admitting: Family Medicine

## 2014-12-28 ENCOUNTER — Encounter: Payer: Self-pay | Admitting: Family Medicine

## 2014-12-28 VITALS — BP 128/80 | HR 92 | Resp 18 | Ht 65.0 in | Wt 148.0 lb

## 2014-12-28 DIAGNOSIS — E785 Hyperlipidemia, unspecified: Secondary | ICD-10-CM

## 2014-12-28 DIAGNOSIS — M62838 Other muscle spasm: Secondary | ICD-10-CM

## 2014-12-28 DIAGNOSIS — R05 Cough: Secondary | ICD-10-CM | POA: Diagnosis not present

## 2014-12-28 DIAGNOSIS — I10 Essential (primary) hypertension: Secondary | ICD-10-CM

## 2014-12-28 DIAGNOSIS — R51 Headache: Secondary | ICD-10-CM

## 2014-12-28 DIAGNOSIS — R058 Other specified cough: Secondary | ICD-10-CM

## 2014-12-28 DIAGNOSIS — Z23 Encounter for immunization: Secondary | ICD-10-CM | POA: Diagnosis not present

## 2014-12-28 DIAGNOSIS — R519 Headache, unspecified: Secondary | ICD-10-CM

## 2014-12-28 DIAGNOSIS — J3089 Other allergic rhinitis: Secondary | ICD-10-CM

## 2014-12-28 DIAGNOSIS — M6248 Contracture of muscle, other site: Secondary | ICD-10-CM

## 2014-12-28 MED ORDER — CYCLOBENZAPRINE HCL 5 MG PO TABS: 5.0000 mg | ORAL_TABLET | Freq: Every day | ORAL | Status: DC

## 2014-12-28 MED ORDER — BUTALBITAL-APAP-CAFFEINE 50-325-40 MG PO TABS: ORAL_TABLET | ORAL | Status: DC

## 2014-12-28 MED ORDER — TRIAMTERENE-HCTZ 75-50 MG PO TABS: 1.0000 | ORAL_TABLET | Freq: Every day | ORAL | Status: DC

## 2014-12-28 MED ORDER — MONTELUKAST SODIUM 10 MG PO TABS: 10.0000 mg | ORAL_TABLET | Freq: Every day | ORAL | Status: DC

## 2014-12-28 NOTE — Progress Notes (Signed)
   Subjective:    Patient ID: Devon CloseMaurice K Lovick Jr., male    DOB: Jun 30, 1980, 34 y.o.   MRN: [id number]  HPI    Review of Systems     Objective:   Physical Exam        Assessment & Plan:

## 2014-12-28 NOTE — Patient Instructions (Signed)
F/u in 5.5 month, call if you need me before  Please get fasting labs as soon as possible.  Meds to be sent in as discussed for 6 month  Flu vaccine today  Blood pressure is good

## 2014-12-29 NOTE — Assessment & Plan Note (Signed)
Controlled, no change in medication DASH diet and commitment to daily physical activity for a minimum of 30 minutes discussed and encouraged, as a part of hypertension management. The importance of attaining a healthy weight is also discussed.  BP/Weight 12/28/2014 10/21/2014 06/23/2014 10/05/2013 02/20/2013 01/16/2013 08/05/2012  Systolic BP 128 106 102 104 122 121 120  Diastolic BP 80 74 68 80 80 80 80  Wt. (Lbs) 148.04 150 155.12 152.12 153.12 150 166.12  BMI 24.64 24.96 25.81 25.31 25.48 24.96 27.64

## 2014-12-29 NOTE — Assessment & Plan Note (Signed)
Hyperlipidemia:Low fat diet discussed and encouraged.   Lipid Panel  Lab Results  Component Value Date   CHOL 200 12/17/2013   HDL 74 12/17/2013   LDLCALC 114* 12/17/2013   TRIG 59 12/17/2013   CHOLHDL 2.7 12/17/2013      Updated lab needed at/ before next visit.

## 2014-12-29 NOTE — Progress Notes (Signed)
   Devon CloseMaurice K Swaney Jr.     MRN: [id number]      DOB: 05/28/1980   HPI Devon Macdonald is here for follow up and re-evaluation of chronic medical conditions, medication management and review of any available recent lab and radiology data.  Preventive health is updated, specifically  Cancer screening and Immunization.   Questions or concerns regarding consultations or procedures which the PT has had in the interim are  addressed. The PT denies any adverse reactions to current medications since the last visit.  There are no new concerns.  There are no specific complaints   ROS Denies recent fever or chills. Denies sinus pressure,uncontrolled  nasal congestion, ear pain or sore throat. Denies chest congestion, productive cough or wheezing. Denies chest pains, palpitations and leg swelling Denies abdominal pain, nausea, vomiting,diarrhea or constipation.   Denies dysuria, frequency, hesitancy or incontinence. Denies joint pain, swelling and limitation in mobility. Denies frequent or severe headaches, seizures, numbness, or tingling. Denies depression, mild increase in anxiety denies insomnia. Denies skin break down or rash.   PE  BP 128/80 mmHg  Pulse 92  Resp 18  Ht 5\' 5"  (1.651 m)  Wt 148 lb 0.6 oz (67.151 kg)  BMI 24.64 kg/m2  SpO2 100%  Patient alert and oriented and in no cardiopulmonary distress.  HEENT: No facial asymmetry, EOMI,   oropharynx pink and moist.  Neck supple no JVD, no mass.  Chest: Clear to auscultation bilaterally.  CVS: S1, S2 no murmurs, no S3.Regular rate.  ABD: Soft non tender.   Ext: No edema  MS: Adequate ROM spine, shoulders, hips and knees.  Skin: Intact, no ulcerations or rash noted.  Psych: Good eye contact, normal affect. Memory intact not anxious or depressed appearing.  CNS: CN 2-12 intact, power,  normal throughout.no focal deficits noted.   Assessment & Plan   Essential hypertension Controlled, no change in medication DASH diet  and commitment to daily physical activity for a minimum of 30 minutes discussed and encouraged, as a part of hypertension management. The importance of attaining a healthy weight is also discussed.  BP/Weight 12/28/2014 10/21/2014 06/23/2014 10/05/2013 02/20/2013 01/16/2013 08/05/2012  Systolic BP 128 106 102 104 122 121 120  Diastolic BP 80 74 68 80 80 80 80  Wt. (Lbs) 148.04 150 155.12 152.12 153.12 150 166.12  BMI 24.64 24.96 25.81 25.31 25.48 24.96 27.64        Rhinitis, allergic Controlled, no change in medication   Muscle spasms of neck Current flare, but with increased work during the holiday season anticipates increased need for muscle relaxant, same to be refilled, uses as needed  Headache On avg 2 per month with response to fioricet. Increased recently due to stress of new illness in his father, trying to cope with this, concerned about his Mom  Hyperlipemia Hyperlipidemia:Low fat diet discussed and encouraged.   Lipid Panel  Lab Results  Component Value Date   CHOL 200 12/17/2013   HDL 74 12/17/2013   LDLCALC 114* 12/17/2013   TRIG 59 12/17/2013   CHOLHDL 2.7 12/17/2013      Updated lab needed at/ before next visit.

## 2014-12-29 NOTE — Assessment & Plan Note (Signed)
Current flare, but with increased work during the holiday season anticipates increased need for muscle relaxant, same to be refilled, uses as needed

## 2014-12-29 NOTE — Assessment & Plan Note (Signed)
On avg 2 per month with response to fioricet. Increased recently due to stress of new illness in his father, trying to cope with this, concerned about his Mom

## 2014-12-29 NOTE — Assessment & Plan Note (Signed)
Controlled, no change in medication  

## 2015-01-10 ENCOUNTER — Telehealth: Payer: Self-pay | Admitting: Family Medicine

## 2015-01-10 ENCOUNTER — Other Ambulatory Visit: Payer: Self-pay

## 2015-01-10 DIAGNOSIS — M545 Low back pain: Secondary | ICD-10-CM

## 2015-01-10 MED ORDER — IBUPROFEN 800 MG PO TABS: 800.0000 mg | ORAL_TABLET | Freq: Three times a day (TID) | ORAL | Status: AC | PRN

## 2015-01-10 NOTE — Telephone Encounter (Signed)
Patient is stating that he has hurt his back at work and he is asking for Ibuprofren to be called in to help with the pain during the day, please advise, I told him that Dr. Lodema HongSimpson was not here this week to authorize new medications and he stated it should be in his file but I dont see it personally, please advise?

## 2015-01-10 NOTE — Telephone Encounter (Signed)
Medication refilled

## 2015-02-15 ENCOUNTER — Other Ambulatory Visit: Payer: Self-pay | Admitting: Family Medicine

## 2015-02-16 LAB — COMPREHENSIVE METABOLIC PANEL
ALK PHOS: 53 U/L (ref 40–115)
ALT: 72 U/L — AB (ref 9–46)
AST: 41 U/L — ABNORMAL HIGH (ref 10–40)
Albumin: 4.8 g/dL (ref 3.6–5.1)
BUN: 10 mg/dL (ref 7–25)
CALCIUM: 9.5 mg/dL (ref 8.6–10.3)
CHLORIDE: 97 mmol/L — AB (ref 98–110)
CO2: 30 mmol/L (ref 20–31)
Creat: 0.91 mg/dL (ref 0.60–1.35)
GLUCOSE: 86 mg/dL (ref 65–99)
Potassium: 3.9 mmol/L (ref 3.5–5.3)
Sodium: 138 mmol/L (ref 135–146)
Total Bilirubin: 1 mg/dL (ref 0.2–1.2)
Total Protein: 7.1 g/dL (ref 6.1–8.1)

## 2015-02-16 LAB — LIPID PANEL
CHOL/HDL RATIO: 2.7 ratio (ref <=5.0)
Cholesterol: 210 mg/dL — ABNORMAL HIGH (ref 125–200)
HDL: 79 mg/dL (ref >=40)
LDL Cholesterol: 115 mg/dL (ref <130)
TRIGLYCERIDES: 81 mg/dL (ref <150)
VLDL: 16 mg/dL (ref <30)

## 2015-02-16 LAB — TSH: TSH: 2.18 u[IU]/mL (ref 0.350–4.500)

## 2015-02-16 LAB — HEPATITIS PANEL, ACUTE
HCV Ab: NEGATIVE
HEP B S AG: NEGATIVE
Hep A IgM: NONREACTIVE
Hep B C IgM: NONREACTIVE

## 2015-02-23 NOTE — Addendum Note (Signed)
Addended by: Abner GreenspanHUDY, Larua Collier H on: 02/23/2015 03:20 PM   Modules accepted: Orders

## 2015-03-14 ENCOUNTER — Other Ambulatory Visit: Payer: Self-pay

## 2015-03-14 ENCOUNTER — Telehealth: Payer: Self-pay | Admitting: Family Medicine

## 2015-03-14 MED ORDER — TRIAMTERENE-HCTZ 75-50 MG PO TABS: 1.0000 | ORAL_TABLET | Freq: Every day | ORAL | Status: DC

## 2015-03-14 NOTE — Telephone Encounter (Signed)
Medication refilled

## 2015-03-14 NOTE — Telephone Encounter (Signed)
Patient is requesting a refill on triamterene-hydrochlorothiazide (MAXZIDE) 75-50 MG tablet asking for a 90 day supply, please advise?

## 2015-03-15 ENCOUNTER — Other Ambulatory Visit: Payer: Self-pay | Admitting: Family Medicine

## 2015-03-30 ENCOUNTER — Other Ambulatory Visit: Payer: Self-pay | Admitting: Family Medicine

## 2015-04-20 ENCOUNTER — Telehealth: Payer: Self-pay | Admitting: Family Medicine

## 2015-04-20 NOTE — Telephone Encounter (Signed)
Wanda is calling asking if Dr. Lodema Hong could call him in something he has had a productive cough and itchy throat for a few days, he is taking Singulair but that has not helped, he uses [facility] on [address]

## 2015-04-21 NOTE — Telephone Encounter (Signed)
Called and left message for patient to return call.  

## 2015-04-24 MED ORDER — BENZONATATE 100 MG PO CAPS: 100.0000 mg | ORAL_CAPSULE | Freq: Two times a day (BID) | ORAL | Status: DC | PRN

## 2015-04-24 MED ORDER — PREDNISONE 5 MG PO TABS: 5.0000 mg | ORAL_TABLET | Freq: Two times a day (BID) | ORAL | Status: AC

## 2015-04-24 NOTE — Telephone Encounter (Signed)
Tessalon perles and prednisone pack sent in , I tried to spk with him, no answer, no message left

## 2015-04-25 ENCOUNTER — Telehealth: Payer: Self-pay | Admitting: Family Medicine

## 2015-04-25 NOTE — Telephone Encounter (Signed)
Pt aware that medication sent

## 2015-05-26 ENCOUNTER — Telehealth: Payer: Self-pay | Admitting: Family Medicine

## 2015-05-26 MED ORDER — SULFAMETHOXAZOLE-TRIMETHOPRIM 800-160 MG PO TABS: 1.0000 | ORAL_TABLET | Freq: Two times a day (BID) | ORAL | Status: DC

## 2015-05-26 NOTE — Telephone Encounter (Signed)
Patient is complaining of another sinus infection, head pounding, he has finished the prednisone, he is asking if something can be called in to help, he can barely breathe out of his nose and he is coughing up mucos, please advise?

## 2015-05-26 NOTE — Telephone Encounter (Signed)
Septra prescribed will need oV next week if no better

## 2015-05-26 NOTE — Telephone Encounter (Signed)
Patient aware.

## 2015-05-26 NOTE — Telephone Encounter (Signed)
Sinuses very congested, no fever that he knows of but also coughing up brownish mucus. Headache and facial pain. Offered appt tomorrow but can't come due to work. Please advise if something can be sent in

## 2015-05-30 ENCOUNTER — Ambulatory Visit (HOSPITAL_COMMUNITY)
Admission: RE | Admit: 2015-05-30 | Discharge: 2015-05-30 | Disposition: A | Discharge status: Home / Self Care | Payer: Managed Care, Other (non HMO) | Source: Ambulatory Visit | Attending: Family Medicine | Admitting: Family Medicine

## 2015-05-30 ENCOUNTER — Ambulatory Visit (INDEPENDENT_AMBULATORY_CARE_PROVIDER_SITE_OTHER): Payer: Managed Care, Other (non HMO) | Admitting: Family Medicine

## 2015-05-30 ENCOUNTER — Encounter: Payer: Self-pay | Admitting: Family Medicine

## 2015-05-30 VITALS — BP 120/78 | HR 78 | Temp 98.0°F | Resp 16 | Ht 65.0 in | Wt 153.0 lb

## 2015-05-30 DIAGNOSIS — R0789 Other chest pain: Secondary | ICD-10-CM

## 2015-05-30 DIAGNOSIS — J209 Acute bronchitis, unspecified: Secondary | ICD-10-CM | POA: Insufficient documentation

## 2015-05-30 DIAGNOSIS — R079 Chest pain, unspecified: Secondary | ICD-10-CM | POA: Insufficient documentation

## 2015-05-30 DIAGNOSIS — I10 Essential (primary) hypertension: Secondary | ICD-10-CM

## 2015-05-30 DIAGNOSIS — J0100 Acute maxillary sinusitis, unspecified: Secondary | ICD-10-CM | POA: Diagnosis not present

## 2015-05-30 MED ORDER — KETOROLAC TROMETHAMINE 60 MG/2ML IM SOLN
60.0000 mg | Freq: Once | INTRAMUSCULAR | Status: AC
  Administered 2015-05-30: 60 mg via INTRAMUSCULAR

## 2015-05-30 MED ORDER — PREDNISONE 5 MG (21) PO TBPK: 5.0000 mg | ORAL_TABLET | ORAL | Status: DC

## 2015-05-30 MED ORDER — IPRATROPIUM BROMIDE 0.02 % IN SOLN
0.5000 mg | Freq: Once | RESPIRATORY_TRACT | Status: AC
  Administered 2015-05-30: 0.5 mg via RESPIRATORY_TRACT

## 2015-05-30 MED ORDER — ALBUTEROL SULFATE (2.5 MG/3ML) 0.083% IN NEBU
2.5000 mg | INHALATION_SOLUTION | Freq: Once | RESPIRATORY_TRACT | Status: AC
  Administered 2015-05-30: 2.5 mg via RESPIRATORY_TRACT

## 2015-05-30 MED ORDER — CEFTRIAXONE SODIUM 1 G IJ SOLR
500.0000 mg | Freq: Once | INTRAMUSCULAR | Status: AC
  Administered 2015-05-30: 500 mg via INTRAMUSCULAR

## 2015-05-30 MED ORDER — METHYLPREDNISOLONE ACETATE 80 MG/ML IJ SUSP
80.0000 mg | Freq: Once | INTRAMUSCULAR | Status: AC
  Administered 2015-05-30: 80 mg via INTRAMUSCULAR

## 2015-05-30 MED ORDER — PROMETHAZINE-DM 6.25-15 MG/5ML PO SYRP: ORAL_SOLUTION | ORAL | Status: DC

## 2015-05-30 MED ORDER — ALBUTEROL SULFATE HFA 108 (90 BASE) MCG/ACT IN AERS: 2.0000 | INHALATION_SPRAY | Freq: Four times a day (QID) | RESPIRATORY_TRACT | Status: DC | PRN

## 2015-05-30 NOTE — Assessment & Plan Note (Addendum)
Chest pain with excess cough and headache, toradol 60 mg IM in office

## 2015-05-30 NOTE — Assessment & Plan Note (Signed)
Complete septra as prescribed

## 2015-05-30 NOTE — Patient Instructions (Signed)
Reschedule April appt, return end August, call if you need me before  You are  Treated for acute bronchitis and sinusitis  Neb treatment and 3 injections in office , also medications are sent to your pharmacy  Pls get CXR today  Work excuse from today, return 06/02/2015  Thank you  for choosing [facility]. We consider it a privelige to serve you.  Delivering excellent health care in a caring and  compassionate way is our goal.  Partnering with you,  so that together we can achieve this goal is our strategy.

## 2015-05-30 NOTE — Assessment & Plan Note (Signed)
Controlled, no change in medication  

## 2015-05-30 NOTE — Progress Notes (Signed)
   Subjective:    Patient ID: Devon CloseMaurice K Bouwens Jr., male    DOB: 27-Nov-1980, 35 y.o.   MRN: [id number]  HPI 1 week h/o worsening head and chest congestion, associated with fever and chills intermittently. Nasal drainage has thickened , and is yellowish green, and at times bloody. Sputum is thick and yellow. C/o bilateral ear pressure, denies hearing loss and sore throat. Increasing fatigue , poor appetitie and sleep disturbed by cough. No improvement with OTC medication.Sttarted septra recently but still worsening  Sent home from work today, reports difficulty breathing with chest tightness and head and chest pain due to excess cough   Review of Systems See HPI  Denies PND, orthopnea, palpitation or leg swelling Denies abdominal pain, nausea, vomiting,diarrhea or constipation.   Denies dysuria, frequency, hesitancy or incontinence. Denies joint pain, swelling and limitation in mobility. Denies headaches, seizures, numbness, or tingling. Denies depression, anxiety or insomnia. Denies skin break down or rash.        Objective:   Physical Exam  BP 120/78 mmHg  Pulse 78  Temp(Src) 98 F (36.7 C)  Resp 16  Ht 5\' 5"  (1.651 m)  Wt 153 lb (69.4 kg)  BMI 25.46 kg/m2  SpO2 98% Patient alert and oriented and in no cardiopulmonary distress.Ill appearing HEENT: No facial asymmetry, EOMI,   oropharynx pink and moist.  Neck supple no JVD, no mass. Left maxillary sinus tender, TM clear bilaterally Chest: decreased air entry,  scattered wheezes and few bilateral bibasilar crackles CVS: S1, S2 no murmurs, no S3.Regular rate.  ABD: Soft non tender.   Ext: No edema  MS: Adequate ROM spine, shoulders, hips and knees.  Skin: Intact, no ulcerations or rash noted.  Psych: Good eye contact, normal affect. Memory intact not anxious or depressed appearing.  CNS: CN 2-12 intact, power,  normal throughout.no focal deficits noted.       Assessment & Plan:  Acute maxillary  sinusitis Complete septra as prescribed  Chest pain Chest pain with excess cough and headache, toradol 60 mg IM in office  Acute bronchitis Neb x 1  Rocephin 500 mg im in office  Depomedrol IM in office, prednisone dose pack and albuterol  Essential hypertension Controlled, no change in medication

## 2015-05-30 NOTE — Assessment & Plan Note (Signed)
Neb x 1  Rocephin 500 mg im in office  Depomedrol IM in office, prednisone dose pack and albuterol

## 2015-06-01 ENCOUNTER — Telehealth: Payer: Self-pay | Admitting: Family Medicine

## 2015-06-01 NOTE — Telephone Encounter (Signed)
Ok to extend for an additional day

## 2015-06-01 NOTE — Telephone Encounter (Signed)
Patient aware and note printed for signature. 

## 2015-06-01 NOTE — Telephone Encounter (Signed)
Patient is asking if Dr. Lodema HongSimpson would extend his work note for today as well, He wants to feel a little better before trying to go back, please advise?

## 2015-06-07 ENCOUNTER — Ambulatory Visit: Payer: Self-pay | Admitting: Family Medicine

## 2015-07-08 ENCOUNTER — Encounter (HOSPITAL_COMMUNITY): Payer: Self-pay

## 2015-07-08 ENCOUNTER — Emergency Department (HOSPITAL_COMMUNITY): Payer: Managed Care, Other (non HMO)

## 2015-07-08 ENCOUNTER — Emergency Department (HOSPITAL_COMMUNITY)
Admission: EM | Admit: 2015-07-08 | Discharge: 2015-07-08 | Disposition: A | Discharge status: Home / Self Care | Payer: Managed Care, Other (non HMO) | Attending: Emergency Medicine | Admitting: Emergency Medicine

## 2015-07-08 DIAGNOSIS — Y9289 Other specified places as the place of occurrence of the external cause: Secondary | ICD-10-CM | POA: Insufficient documentation

## 2015-07-08 DIAGNOSIS — S61452A Open bite of left hand, initial encounter: Secondary | ICD-10-CM | POA: Insufficient documentation

## 2015-07-08 DIAGNOSIS — Y998 Other external cause status: Secondary | ICD-10-CM | POA: Diagnosis not present

## 2015-07-08 DIAGNOSIS — Z7951 Long term (current) use of inhaled steroids: Secondary | ICD-10-CM | POA: Insufficient documentation

## 2015-07-08 DIAGNOSIS — E669 Obesity, unspecified: Secondary | ICD-10-CM | POA: Insufficient documentation

## 2015-07-08 DIAGNOSIS — Y9389 Activity, other specified: Secondary | ICD-10-CM | POA: Insufficient documentation

## 2015-07-08 DIAGNOSIS — S61012A Laceration without foreign body of left thumb without damage to nail, initial encounter: Secondary | ICD-10-CM | POA: Insufficient documentation

## 2015-07-08 DIAGNOSIS — I10 Essential (primary) hypertension: Secondary | ICD-10-CM | POA: Diagnosis not present

## 2015-07-08 DIAGNOSIS — Z87438 Personal history of other diseases of male genital organs: Secondary | ICD-10-CM | POA: Diagnosis not present

## 2015-07-08 DIAGNOSIS — W540XXA Bitten by dog, initial encounter: Secondary | ICD-10-CM | POA: Diagnosis not present

## 2015-07-08 DIAGNOSIS — Z23 Encounter for immunization: Secondary | ICD-10-CM | POA: Diagnosis not present

## 2015-07-08 DIAGNOSIS — Z79899 Other long term (current) drug therapy: Secondary | ICD-10-CM | POA: Insufficient documentation

## 2015-07-08 DIAGNOSIS — Z792 Long term (current) use of antibiotics: Secondary | ICD-10-CM | POA: Diagnosis not present

## 2015-07-08 DIAGNOSIS — S6992XA Unspecified injury of left wrist, hand and finger(s), initial encounter: Secondary | ICD-10-CM | POA: Diagnosis present

## 2015-07-08 MED ORDER — LIDOCAINE HCL 2 % IJ SOLN
10.0000 mL | Freq: Once | INTRAMUSCULAR | Status: AC
  Administered 2015-07-08: 200 mg
  Filled 2015-07-08: qty 20

## 2015-07-08 MED ORDER — AMOXICILLIN-POT CLAVULANATE 875-125 MG PO TABS
1.0000 | ORAL_TABLET | Freq: Once | ORAL | Status: AC
  Administered 2015-07-08: 1 via ORAL
  Filled 2015-07-08: qty 1

## 2015-07-08 MED ORDER — IBUPROFEN 800 MG PO TABS: 800.0000 mg | ORAL_TABLET | Freq: Three times a day (TID) | ORAL | Status: DC | PRN

## 2015-07-08 MED ORDER — HYDROCODONE-ACETAMINOPHEN 5-325 MG PO TABS: 1.0000 | ORAL_TABLET | Freq: Four times a day (QID) | ORAL | Status: DC | PRN

## 2015-07-08 MED ORDER — TETANUS-DIPHTH-ACELL PERTUSSIS 5-2.5-18.5 LF-MCG/0.5 IM SUSP
0.5000 mL | Freq: Once | INTRAMUSCULAR | Status: AC
  Administered 2015-07-08: 0.5 mL via INTRAMUSCULAR
  Filled 2015-07-08: qty 0.5

## 2015-07-08 MED ORDER — IBUPROFEN 400 MG PO TABS
800.0000 mg | ORAL_TABLET | Freq: Once | ORAL | Status: AC
  Administered 2015-07-08: 800 mg via ORAL
  Filled 2015-07-08: qty 2

## 2015-07-08 MED ORDER — LIDOCAINE HCL (PF) 1 % IJ SOLN: 10.0000 mL | Freq: Once | INTRAMUSCULAR | Status: DC

## 2015-07-08 MED ORDER — AMOXICILLIN-POT CLAVULANATE 875-125 MG PO TABS: 1.0000 | ORAL_TABLET | Freq: Two times a day (BID) | ORAL | Status: DC

## 2015-07-08 NOTE — ED Provider Notes (Signed)
CSN: [id number]     Arrival date & time 07/08/15  2006 History   By signing my name below, I, Devon Macdonald, attest that this documentation has been prepared under the direction and in the presence of [place], PA-C. Electronically Signed: Evon Macdonald, ED Scribe. 07/08/2015. 8:49 PM.     Chief Complaint  Patient presents with  . Animal Bite   The history is provided by the patient. No language interpreter was used.   HPI Comments: Devon Coey. is a 35 y.o. male who presents to the Emergency Department complaining of animal bite onset tonight PTA. Pt states that he was bitten by an unknown injured dog. Pt states that the unknown dog entered his vehicle and would not exit. He states as he tried to get the dog to exit the vehicle the dog bit him. He states that the dog is in the possession of animal control. Presents with laceration to his left thumb. Pt rates the severity of his pain 7/10 and throbbing.  Pt states that the bleeding is controlled. Pt states that he did clean the wound with peroxide. Pt states that he is right hand dominant. Pt denies numbness or tingling, any weakness in the hand.  Denies any other injury. Pt states that his tetanus is not UTD. Pt does report allergy to doxycycline.  The dog was a small jack russell terrier mix and seemed to have an injured paw, jumped into their vehicle for unknown reason.     Past Medical History  Diagnosis Date  . Obesity   . Hypertension   . Elevated LFTs   . Epididymitis    Past Surgical History  Procedure Laterality Date  . Lithrostripsy  2003  . Broke right femur  1997  . Broke right hand  2003   Family History  Problem Relation Age of Onset  . Diabetes Mother   . Diabetes Father    Social History  Substance Use Topics  . Smoking status: Never Smoker   . Smokeless tobacco: None  . Alcohol Use: No    Review of Systems  Constitutional: Negative for fever, diaphoresis, activity change and appetite change.   Musculoskeletal: Negative for arthralgias.  Skin: Positive for wound.  Allergic/Immunologic: Negative for immunocompromised state.  Neurological: Negative for weakness and numbness.  Hematological: Does not bruise/bleed easily.  Psychiatric/Behavioral: Negative for self-injury.     Allergies  Review of patient's allergies indicates no known allergies.  Home Medications   Prior to Admission medications   Medication Sig Start Date End Date Taking? Authorizing Provider  albuterol (PROVENTIL HFA;VENTOLIN HFA) 108 (90 Base) MCG/ACT inhaler Inhale 2 puffs into the lungs every 6 (six) hours as needed for wheezing or shortness of breath. 05/30/15   Kerri Perches, MD  benzonatate (TESSALON) 100 MG capsule Take 1 capsule (100 mg total) by mouth 2 (two) times daily as needed for cough. 04/24/15   Kerri Perches, MD  butalbital-acetaminophen-caffeine (FIORICET, [facility]) [phone] MG tablet One tablet twice daily as needed, for uncontrolled headache  Maximum of two tablets per week 12/28/14   Kerri Perches, MD  cyclobenzaprine (FLEXERIL) 5 MG tablet TAKE 1 TABLET BY MOUTH AT BEDTIME 03/30/15   Kerri Perches, MD  mometasone (NASONEX) 50 MCG/ACT nasal spray Place 2 sprays into the nose daily. 06/23/14   Kerri Perches, MD  montelukast (SINGULAIR) 10 MG tablet TAKE 1 TABLET BY MOUTH AT BEDTIME 03/30/15   Kerri Perches, MD  Multiple Vitamin (MULTIVITAMIN WITH  MINERALS) TABS Take 1 tablet by mouth daily.    Historical Provider, MD  predniSONE (STERAPRED UNI-PAK 21 TAB) 5 MG (21) TBPK tablet Take 1 tablet (5 mg total) by mouth as directed. Use as directed 05/30/15   Kerri PerchesMargaret E Simpson, MD  promethazine-dextromethorphan (PROMETHAZINE-DM) 6.25-15 MG/5ML syrup One teaspoon at bedtime as needed for cough 05/30/15   Kerri PerchesMargaret E Simpson, MD  sulfamethoxazole-trimethoprim (BACTRIM DS,SEPTRA DS) 800-160 MG tablet Take 1 tablet by mouth 2 (two) times daily. 05/26/15   Kerri PerchesMargaret E Simpson, MD   triamterene-hydrochlorothiazide (MAXZIDE) 75-50 MG tablet TAKE 1 TABLET BY MOUTH DAILY. 03/15/15   Kerri PerchesMargaret E Simpson, MD   BP 134/91 mmHg  Pulse 85  Temp(Src) 99 F (37.2 C) (Oral)  Resp 18  SpO2 100%   Physical Exam  Constitutional: He appears well-developed and well-nourished. No distress.  HENT:  Head: Normocephalic and atraumatic.  Neck: Neck supple.  Cardiovascular: Normal rate and regular rhythm.   Pulmonary/Chest: Effort normal and breath sounds normal. No respiratory distress. He has no wheezes. He has no rales.  Abdominal: Soft. He exhibits no distension and no mass. There is no tenderness. There is no rebound and no guarding.  Musculoskeletal:  Left hand: blood filled blister of the palmar aspect of the distal phalanx, tiny skin tear/abrasion over the ulnar edge of the thumb not involving the nail bed. Full active range of motion of all digits, strength 5/5, sensation intact, capillary refill < 2 seconds.     Neurological: He is alert. He exhibits normal muscle tone.  Skin: He is not diaphoretic. No pallor.  Psychiatric: He has a normal mood and affect. His behavior is normal.  Nursing note and vitals reviewed.   ED Course  Procedures (including critical care time) DIAGNOSTIC STUDIES: Oxygen Saturation is 100% on RA, normal by my interpretation.    COORDINATION OF CARE: 8:50 PM-Discussed treatment plan with pt at bedside and pt agreed to plan.     Labs Review Labs Reviewed - No data to display  Imaging Review Dg Finger Thumb Left  07/08/2015  CLINICAL DATA:  35 year old male with dog bite to the left thumb EXAM: LEFT THUMB 2+V COMPARISON:  None. FINDINGS: There is no evidence of fracture or dislocation. There is no evidence of arthropathy or other focal bone abnormality. Soft tissues are unremarkable IMPRESSION: Negative. Electronically Signed   By: Elgie CollardArash  Radparvar M.D.   On: 07/08/2015 21:21      EKG Interpretation None        NERVE BLOCK Performed  by: Trixie DredgeWEST, Avaeh Ewer Consent: Verbal consent obtained. Required items: required blood products, implants, devices, and special equipment available Time out: Immediately prior to procedure a "time out" was called to verify the correct patient, procedure, equipment, support staff and site/side marked as required.  Indication: dog bite Nerve block body site: left thumb digital block  Preparation: Patient was prepped and draped in the usual sterile fashion. Needle gauge: 24 G Location technique: anatomical landmarks   anesthetic: lidocaine 2%  Anesthetic total: 6 ml  Outcome: pain improved Patient tolerance: Patient tolerated the procedure well with no immediate complications.   MDM   Final diagnoses:  Dog bite, hand, left, initial encounter    Afebrile, nontoxic patient with injury to his left thumb while trying to get a small jack russell terrier out of his car.  The dog is not known to him, has unknown vaccination status but is in the custody of animal control.   Xray negative.  Tetanus vx updated.  Digital block offered and accepted by pt in order to perform thorough wound irrigation (performed by tech)   Very small wound on thumb.  D/C home with augmentin, motrin, #6 norco.  Return precautions, wound care discussed.    Discussed result, findings, treatment, and follow up  with patient.  Pt given return precautions.  Pt verbalizes understanding and agrees with plan.      I personally performed the services described in this documentation, which was scribed in my presence. The recorded information has been reviewed and is accurate.      Trixie Dredge, PA-C 07/08/15 2220  Arby Barrette, MD 07/09/15 [phone]

## 2015-07-08 NOTE — ED Notes (Signed)
Patient able to ambulate independently  

## 2015-07-08 NOTE — Discharge Instructions (Signed)
Read the information below.  Use the prescribed medication as directed.  Please discuss all new medications with your pharmacist.  Do not take additional tylenol while taking the prescribed pain medication to avoid overdose.  You may return to the Emergency Department at any time for worsening condition or any new symptoms that concern you.  Keep the wound clean and covered.  Do not use antibiotic ointment.  If you develop redness, swelling, pus draining from the wound, or fevers greater than 100.4, return to the ER immediately for a recheck.   Please follow closely with animal control and follow their instructions regarding rabies vaccination.   Animal Bite Animal bites can range from mild to serious. An animal bite can result in a scratch on the skin, a deep open cut, a puncture of the skin, a crush injury, or tearing away of the skin or a body part. A small bite from a house pet will usually not cause serious problems. However, some animal bites can become infected or injure a bone or other tissue.  Bites from certain animals can be more dangerous because of the risk of spreading rabies, which is a serious viral infection. This risk is higher with bites from stray animals or wild animals, such as raccoons, foxes, skunks, and bats. Dogs are responsible for most animal bites. Children are bitten more often than adults. SYMPTOMS  Common symptoms of an animal bite include:   Pain.   Bleeding.   Swelling.   Bruising.  DIAGNOSIS  This condition may be diagnosed based on a physical exam and medical history. Your health care provider will examine the wound and ask for details about the animal and how the bite happened. You may also have tests, such as:   Blood tests to check for infection or to determine if surgery is needed.  X-rays to check for damage to bones or joints.  Culture test. This uses a sample of fluid from the wound to check for infection. TREATMENT  Treatment varies depending  on the location and type of animal bite and your medical history. Treatment may include:   Wound care. This often includes cleaning the wound, flushing the wound with saline solution, and applying a bandage (dressing). Sometimes, the wound is left open to heal because of the high risk of infection. However, in some cases, the wound may be closed with stitches (sutures), staples, skin glue, or adhesive strips.   Antibiotic medicine.   Tetanus shot.   Rabies treatment if the animal could have rabies.  In some cases, bites that have become infected may require IV antibiotics and surgical treatment in the hospital.  HOME CARE INSTRUCTIONS Wound Care  Follow instructions from your health care provider about how to take care of your wound. Make sure you:  Wash your hands with soap and water before you change your dressing. If soap and water are not available, use hand sanitizer.  Change your dressing as told by your health care provider.  Leave sutures, skin glue, or adhesive strips in place. These skin closures may need to be in place for 2 weeks or longer. If adhesive strip edges start to loosen and curl up, you may trim the loose edges. Do not remove adhesive strips completely unless your health care provider tells you to do that.  Check your wound every day for signs of infection. Watch for:   Increasing redness, swelling, or pain.   Fluid, blood, or pus.  General Instructions  Take or apply over-the-counter  and prescription medicines only as told by your health care provider.   If you were prescribed an antibiotic, take or apply it as told by your health care provider. Do not stop using the antibiotic even if your condition improves.   Keep the injured area raised (elevated) above the level of your heart while you are sitting or lying down, if this is possible.   If directed, apply ice to the injured area.   Put ice in a plastic bag.   Place a towel between your  skin and the bag.   Leave the ice on for 20 minutes, 2-3 times per day.   Keep all follow-up visits as told by your health care provider. This is important.  SEEK MEDICAL CARE IF:  You have increasing redness, swelling, or pain at the site of your wound.   You have a general feeling of sickness (malaise).   You feel nauseous or you vomit.   You have pain that does not get better.  SEEK IMMEDIATE MEDICAL CARE IF:  You have a red streak extending away from your wound.   You have fluid, blood, or pus coming from your wound.   You have a fever or chills.   You have trouble moving your injured area.   You have numbness or tingling extending beyond the wound.   This information is not intended to replace advice given to you by your health care provider. Make sure you discuss any questions you have with your health care provider.   Document Released: 10/24/2010 Document Revised: 10/27/2014 Document Reviewed: 06/23/2014 Elsevier Interactive Patient Education [employer]2016 [employer] Inc.

## 2015-07-08 NOTE — ED Notes (Signed)
Patient was bit by a dog that was not his own. Bit on the left thumb, does have minimal bleeding and a small puncture wound.  Does not know about the immunization status of the dog.  The dog was taken by animal control.  Patient A&Ox4

## 2015-07-26 ENCOUNTER — Telehealth: Payer: Self-pay | Admitting: Family Medicine

## 2015-07-26 ENCOUNTER — Other Ambulatory Visit: Payer: Self-pay

## 2015-07-26 MED ORDER — PSEUDOEPH-BROMPHEN-DM 30-2-10 MG/5ML PO SYRP: 5.0000 mL | ORAL_SOLUTION | Freq: Every evening | ORAL | Status: DC | PRN

## 2015-07-26 MED ORDER — BENZONATATE 100 MG PO CAPS: 100.0000 mg | ORAL_CAPSULE | Freq: Two times a day (BID) | ORAL | Status: DC | PRN

## 2015-07-26 NOTE — Telephone Encounter (Signed)
Called and left message for patient notifying him that he should seek care at an urgent care due to symptoms.

## 2015-07-26 NOTE — Telephone Encounter (Signed)
Advised patient that if he is seeking antibiotic treatment he will need to go to urgent care.  Refill of tessalon perles and cough syrup sent to requested pharmacy. Patient will call back if he feels he needs to be seen next week.

## 2015-07-26 NOTE — Telephone Encounter (Signed)
Devon Macdonald is doing a lot of coughing, congested hes loosing his voice, musinex not working, symptoms since Saturday, Please advise?

## 2015-08-25 ENCOUNTER — Other Ambulatory Visit: Payer: Self-pay

## 2015-08-25 ENCOUNTER — Telehealth: Payer: Self-pay | Admitting: Family Medicine

## 2015-08-25 MED ORDER — CYCLOBENZAPRINE HCL 5 MG PO TABS: 5.0000 mg | ORAL_TABLET | Freq: Every day | ORAL | Status: DC

## 2015-08-25 NOTE — Telephone Encounter (Signed)
Medication refilled

## 2015-08-25 NOTE — Telephone Encounter (Signed)
Devon Macdonald is asking for a refill on muscle relaxers, please advise?

## 2015-09-14 ENCOUNTER — Telehealth: Payer: Self-pay | Admitting: Family Medicine

## 2015-09-14 ENCOUNTER — Other Ambulatory Visit: Payer: Self-pay

## 2015-09-14 MED ORDER — TRIAMTERENE-HCTZ 75-50 MG PO TABS: 1.0000 | ORAL_TABLET | Freq: Every day | ORAL | 1 refills | Status: DC

## 2015-09-14 NOTE — Telephone Encounter (Signed)
Med refilled.

## 2015-09-14 NOTE — Telephone Encounter (Signed)
Blood pressure medicine Maxzide needs refilled

## 2015-10-20 ENCOUNTER — Encounter: Payer: Self-pay | Admitting: Family Medicine

## 2015-10-20 ENCOUNTER — Ambulatory Visit (INDEPENDENT_AMBULATORY_CARE_PROVIDER_SITE_OTHER): Payer: Managed Care, Other (non HMO) | Admitting: Family Medicine

## 2015-10-20 VITALS — BP 112/74 | HR 72 | Resp 18 | Ht 65.0 in | Wt 157.0 lb

## 2015-10-20 DIAGNOSIS — R7989 Other specified abnormal findings of blood chemistry: Secondary | ICD-10-CM

## 2015-10-20 DIAGNOSIS — M62838 Other muscle spasm: Secondary | ICD-10-CM

## 2015-10-20 DIAGNOSIS — I10 Essential (primary) hypertension: Secondary | ICD-10-CM | POA: Diagnosis not present

## 2015-10-20 DIAGNOSIS — Z23 Encounter for immunization: Secondary | ICD-10-CM | POA: Diagnosis not present

## 2015-10-20 DIAGNOSIS — E785 Hyperlipidemia, unspecified: Secondary | ICD-10-CM

## 2015-10-20 DIAGNOSIS — M25512 Pain in left shoulder: Secondary | ICD-10-CM

## 2015-10-20 DIAGNOSIS — M6248 Contracture of muscle, other site: Secondary | ICD-10-CM

## 2015-10-20 MED ORDER — GABAPENTIN 100 MG PO CAPS: 100.0000 mg | ORAL_CAPSULE | Freq: Every day | ORAL | 4 refills | Status: DC

## 2015-10-20 MED ORDER — CYCLOBENZAPRINE HCL 10 MG PO TABS: 10.0000 mg | ORAL_TABLET | Freq: Every day | ORAL | 5 refills | Status: DC

## 2015-10-20 NOTE — Patient Instructions (Addendum)
F/u, physical/ preventive early January, call if you need me before  Fasting labs 1 week before visit  Higher dose of muscle relaxant, flexeril 10 mg one at night is sent in [place] for chronic neck, shoulder and back pain is gabapentin 100mg  one at bedtime  Excellent BP  Flu vaccine today  Please work on good  health habits so that your health will improve. 1. Commitment to daily physical activity for 30 to 60  minutes, if you are able to do this.  2. Commitment to wise food choices. Aim for half of your  food intake to be vegetable and fruit, one quarter starchy foods, and one quarter protein. Try to eat on a regular schedule  3 meals per day, snacking between meals should be limited to vegetables or fruits or small portions of nuts. 64 ounces of water per day is generally recommended, unless you have specific health conditions, like heart failure or kidney failure where you will need to limit fluid intake.  3. Commitment to sufficient and a  good quality of physical and mental rest daily, generally between 6 to 8 hours per day.  WITH PERSISTANCE AND PERSEVERANCE, THE IMPOSSIBLE , BECOMES THE NORM! Thank you  for choosing [facility]. We consider it a privelige to serve you.  Delivering excellent health care in a caring and  compassionate way is our goal.  Partnering with you,  so that together we can achieve this goal is our strategy.

## 2015-10-20 NOTE — Assessment & Plan Note (Signed)
Controlled, no change in medication DASH diet and commitment to daily physical activity for a minimum of 30 minutes discussed and encouraged, as a part of hypertension management. The importance of attaining a healthy weight is also discussed.  BP/Weight 10/20/2015 07/08/2015 05/30/2015 12/28/2014 10/21/2014 06/23/2014 10/05/2013  Systolic BP 112 140 120 128 106 102 104  Diastolic BP 74 85 78 80 74 68 80  Wt. (Lbs) 157 - 153 148.04 150 155.12 152.12  BMI 26.13 - 25.46 24.64 24.96 25.81 25.31

## 2015-10-23 DIAGNOSIS — M25512 Pain in left shoulder: Secondary | ICD-10-CM | POA: Insufficient documentation

## 2015-10-23 NOTE — Assessment & Plan Note (Signed)
Increased and uncontrolled x 2 months, start bedtime gabapentin

## 2015-10-23 NOTE — Assessment & Plan Note (Signed)
Hyperlipidemia:Low fat diet discussed and encouraged.   Lipid Panel  Lab Results  Component Value Date   CHOL 210 (H) 02/15/2015   HDL 79 02/15/2015   LDLCALC 115 02/15/2015   TRIG 81 02/15/2015   CHOLHDL 2.7 02/15/2015   Updated lab needed at/ before next visit.

## 2015-10-23 NOTE — Progress Notes (Signed)
   Devon CloseMaurice K Chavero Jr.     MRN: [id number]      DOB: 11-Aug-1980   HPI Devon Macdonald is here for follow up and re-evaluation of chronic medical conditions, medication management and review of any available recent lab and radiology data.  Preventive health is updated, specifically  Cancer screening and Immunization.   Questions or concerns regarding consultations or procedures which the PT has had in the interim are  addressed. The PT denies any adverse reactions to current medications since the last visit.  2 month h/o increased left shoulder pain and neck spasm, no aggravating trauma ROS Denies recent fever or chills. Denies sinus pressure, nasal congestion, ear pain or sore throat. Denies chest congestion, productive cough or wheezing. Denies chest pains, palpitations and leg swelling Denies abdominal pain, nausea, vomiting,diarrhea or constipation.   Denies dysuria, frequency, hesitancy or incontinence.  Denies headaches, seizures, numbness, or tingling. Denies depression, anxiety or insomnia. Denies skin break down or rash.   PE  BP 112/74   Pulse 72   Resp 18   Ht 5\' 5"  (1.651 m)   Wt 157 lb (71.2 kg)   SpO2 100%   BMI 26.13 kg/m   Patient alert and oriented and in no cardiopulmonary distress.  HEENT: No facial asymmetry, EOMI,   oropharynx pink and moist.  Neck decreased ROM with spasm. no JVD, no mass.  Chest: Clear to auscultation bilaterally.  CVS: S1, S2 no murmurs, no S3.Regular rate.  ABD: Soft non tender.   Ext: No edema  MS: Adequate ROM spine,  hips and knees. Decreased in left shoulder Skin: Intact, no ulcerations or rash noted.  Psych: Good eye contact, normal affect. Memory intact not anxious or depressed appearing.  CNS: CN 2-12 intact, power,  normal throughout.no focal deficits noted.   Assessment & Plan Essential hypertension Controlled, no change in medication DASH diet and commitment to daily physical activity for a minimum of 30 minutes  discussed and encouraged, as a part of hypertension management. The importance of attaining a healthy weight is also discussed.  BP/Weight 10/20/2015 07/08/2015 05/30/2015 12/28/2014 10/21/2014 06/23/2014 10/05/2013  Systolic BP 112 140 120 128 106 102 104  Diastolic BP 74 85 78 80 74 68 80  Wt. (Lbs) 157 - 153 148.04 150 155.12 152.12  BMI 26.13 - 25.46 24.64 24.96 25.81 25.31       Muscle spasms of neck Increased and uncontrolled affecting left shoulder also, continue muscle relaxant and add gabapentin at bedtime  Hyperlipemia Hyperlipidemia:Low fat diet discussed and encouraged.   Lipid Panel  Lab Results  Component Value Date   CHOL 210 (H) 02/15/2015   HDL 79 02/15/2015   LDLCALC 115 02/15/2015   TRIG 81 02/15/2015   CHOLHDL 2.7 02/15/2015   Updated lab needed at/ before next visit.     Shoulder pain, left Increased and uncontrolled x 2 months, start bedtime gabapentin

## 2015-10-23 NOTE — Assessment & Plan Note (Signed)
Increased and uncontrolled affecting left shoulder also, continue muscle relaxant and add gabapentin at bedtime

## 2015-12-08 ENCOUNTER — Other Ambulatory Visit: Payer: Self-pay

## 2015-12-08 MED ORDER — GABAPENTIN 100 MG PO CAPS: 100.0000 mg | ORAL_CAPSULE | Freq: Every day | ORAL | 4 refills | Status: DC

## 2015-12-17 ENCOUNTER — Other Ambulatory Visit: Payer: Self-pay | Admitting: Family Medicine

## 2016-01-23 ENCOUNTER — Telehealth: Payer: Self-pay | Admitting: Family Medicine

## 2016-01-23 MED ORDER — PREDNISONE 5 MG (21) PO TBPK: ORAL_TABLET | ORAL | 0 refills | Status: DC

## 2016-01-23 NOTE — Telephone Encounter (Signed)
Devon Macdonald is calling c/o a lot of chest congestion, he is asking if Dr. Lodema HongSimpson would call him in something, please advise?

## 2016-01-23 NOTE — Addendum Note (Signed)
Addended by: Kandis FantasiaSLADE, COURTNEY B on: 01/23/2016 05:01 PM   Modules accepted: Orders

## 2016-01-23 NOTE — Telephone Encounter (Signed)
Patient states that he has had to use inhaler.  Symptoms x 3 days.  Cough is not productive.  States that feels as if lungs are tightened when coughing.  Please advise.

## 2016-01-23 NOTE — Telephone Encounter (Signed)
Called and left message for patient notifying of rx.  

## 2016-01-23 NOTE — Telephone Encounter (Signed)
pls send pred 5 mg dose pack x 6 days and let him know

## 2016-01-30 ENCOUNTER — Telehealth: Payer: Self-pay

## 2016-01-30 ENCOUNTER — Other Ambulatory Visit: Payer: Self-pay

## 2016-01-30 MED ORDER — GABAPENTIN 100 MG PO CAPS: 100.0000 mg | ORAL_CAPSULE | Freq: Every day | ORAL | 1 refills | Status: DC

## 2016-01-30 MED ORDER — CYCLOBENZAPRINE HCL 10 MG PO TABS: 10.0000 mg | ORAL_TABLET | Freq: Every day | ORAL | 1 refills | Status: DC

## 2016-01-30 MED ORDER — MONTELUKAST SODIUM 10 MG PO TABS: 10.0000 mg | ORAL_TABLET | Freq: Every day | ORAL | 1 refills | Status: DC

## 2016-01-30 MED ORDER — TRIAMTERENE-HCTZ 75-50 MG PO TABS: 1.0000 | ORAL_TABLET | Freq: Every day | ORAL | 1 refills | Status: DC

## 2016-01-30 NOTE — Telephone Encounter (Signed)
Agree 100%  

## 2016-01-31 NOTE — Telephone Encounter (Signed)
Patient aware.

## 2016-02-23 ENCOUNTER — Encounter: Payer: Self-pay | Admitting: Family Medicine

## 2016-09-07 ENCOUNTER — Other Ambulatory Visit: Payer: Self-pay

## 2016-09-07 MED ORDER — CYCLOBENZAPRINE HCL 10 MG PO TABS: 10.0000 mg | ORAL_TABLET | Freq: Every day | ORAL | 0 refills | Status: DC

## 2016-09-07 MED ORDER — GABAPENTIN 100 MG PO CAPS: 100.0000 mg | ORAL_CAPSULE | Freq: Every day | ORAL | 0 refills | Status: DC

## 2016-09-07 MED ORDER — TRIAMTERENE-HCTZ 75-50 MG PO TABS: 1.0000 | ORAL_TABLET | Freq: Every day | ORAL | 0 refills | Status: DC

## 2016-10-15 ENCOUNTER — Other Ambulatory Visit: Payer: Self-pay | Admitting: Family Medicine

## 2016-10-15 ENCOUNTER — Telehealth: Payer: Self-pay | Admitting: Family Medicine

## 2016-10-15 MED ORDER — PREDNISONE 5 MG (21) PO TBPK: 5.0000 mg | ORAL_TABLET | ORAL | 0 refills | Status: DC

## 2016-10-15 NOTE — Telephone Encounter (Signed)
Pls let him know no antibiotic without oV. Sound like allergy, I will e scribe prednisone dose pack If  He feels he ahs infection can offer work in on Tuesday or Thursday this week

## 2016-10-15 NOTE — Telephone Encounter (Signed)
Patient left message on nurse line, requesting z-pack. He states he has sinus drainage, going into chest.   Callback# [phone]

## 2016-10-16 MED ORDER — PREDNISONE 5 MG (21) PO TBPK: 5.0000 mg | ORAL_TABLET | ORAL | 0 refills | Status: DC

## 2016-10-16 NOTE — Telephone Encounter (Signed)
Left message for patient to call for Thursday appt if prednisone doesn't help

## 2016-10-31 ENCOUNTER — Ambulatory Visit (INDEPENDENT_AMBULATORY_CARE_PROVIDER_SITE_OTHER): Payer: BLUE CROSS/BLUE SHIELD | Admitting: Family Medicine

## 2016-10-31 VITALS — BP 120/78 | HR 75 | Temp 98.5°F | Resp 16 | Ht 65.0 in | Wt 197.2 lb

## 2016-10-31 DIAGNOSIS — E785 Hyperlipidemia, unspecified: Secondary | ICD-10-CM | POA: Diagnosis not present

## 2016-10-31 DIAGNOSIS — E669 Obesity, unspecified: Secondary | ICD-10-CM | POA: Diagnosis not present

## 2016-10-31 DIAGNOSIS — I10 Essential (primary) hypertension: Secondary | ICD-10-CM | POA: Diagnosis not present

## 2016-10-31 DIAGNOSIS — Z23 Encounter for immunization: Secondary | ICD-10-CM | POA: Diagnosis not present

## 2016-10-31 DIAGNOSIS — R946 Abnormal results of thyroid function studies: Secondary | ICD-10-CM | POA: Diagnosis not present

## 2016-10-31 DIAGNOSIS — J3089 Other allergic rhinitis: Secondary | ICD-10-CM

## 2016-10-31 DIAGNOSIS — R7989 Other specified abnormal findings of blood chemistry: Secondary | ICD-10-CM

## 2016-10-31 MED ORDER — TOPIRAMATE 50 MG PO TABS: 50.0000 mg | ORAL_TABLET | Freq: Two times a day (BID) | ORAL | 5 refills | Status: DC

## 2016-10-31 MED ORDER — TOPIRAMATE 50 MG PO TABS: 50.0000 mg | ORAL_TABLET | Freq: Two times a day (BID) | ORAL | 1 refills | Status: DC

## 2016-10-31 MED ORDER — SUMATRIPTAN SUCCINATE 100 MG PO TABS: ORAL_TABLET | ORAL | 3 refills | Status: DC

## 2016-10-31 NOTE — Patient Instructions (Addendum)
F/u in 6 months, call if you need me sooner  Flu vaccine today  Weight loss challenge of 12 to 15 pounds in next 6 months, get active and  Leave the [employer]i   Labs today cBC, lipid, chem 7,, TSH  New for headache prevention is topamax one twice daily every day, and OK to use immitrex with ibuprofen as directed  It is important that you exercise regularly at least 30 minutes 5 times a week. If you develop chest pain, have severe difficulty breathing, or feel very tired, stop exercising immediately and seek medical attention

## 2016-10-31 NOTE — Assessment & Plan Note (Signed)
Uncontrolled , has on average 5 per month  Start topamax and use imitrex

## 2016-10-31 NOTE — Assessment & Plan Note (Signed)
Controlled, no change in medication DASH diet and commitment to daily physical activity for a minimum of 30 minutes discussed and encouraged, as a part of hypertension management. The importance of attaining a healthy weight is also discussed.  BP/Weight 10/31/2016 10/20/2015 07/08/2015 05/30/2015 12/28/2014 10/21/2014 06/23/2014  Systolic BP 120 112 140 120 128 106 102  Diastolic BP 78 74 85 78 80 74 68  Wt. (Lbs) 197.25 157 - 153 148.04 150 155.12  BMI 32.82 26.13 - 25.46 24.64 24.96 25.81

## 2016-11-03 ENCOUNTER — Encounter: Payer: Self-pay | Admitting: Family Medicine

## 2016-11-03 DIAGNOSIS — E669 Obesity, unspecified: Secondary | ICD-10-CM | POA: Insufficient documentation

## 2016-11-03 DIAGNOSIS — E66811 Obesity, class 1: Secondary | ICD-10-CM | POA: Insufficient documentation

## 2016-11-03 NOTE — Assessment & Plan Note (Signed)
Deteriorated. Patient re-educated about  the importance of commitment to a  minimum of 150 minutes of exercise per week.  The importance of healthy food choices with portion control discussed. Encouraged to start a food diary, count calories and to consider  joining a support group. Sample diet sheets offered. Goals set by the patient for the next several months.   Weight /BMI 10/31/2016 10/20/2015 05/30/2015  WEIGHT 197 lb 4 oz 157 lb 153 lb  HEIGHT     BMI 32.82 kg/m2 26.13 kg/m2 25.46 kg/m2

## 2016-11-03 NOTE — Assessment & Plan Note (Signed)
Hyperlipidemia:Low fat diet discussed and encouraged.   Lipid Panel  Lab Results  Component Value Date   CHOL 210 (H) 02/15/2015   HDL 79 02/15/2015   LDLCALC 115 02/15/2015   TRIG 81 02/15/2015   CHOLHDL 2.7 02/15/2015  Updated lab needed t.

## 2016-11-03 NOTE — Assessment & Plan Note (Signed)
Controlled, no change in medication  

## 2016-11-03 NOTE — Progress Notes (Signed)
Devon Macdonald.     MRN: [id number]      DOB: Nov 07, 1980   HPI Devon Macdonald is here for follow up and re-evaluation of chronic medical conditions, medication management and review of any available recent lab and radiology data.  Preventive health is updated, specifically  Cancer screening and Immunization.   Questions or concerns regarding consultations or procedures which the PT has had in the interim are  addressed. The PT denies any adverse reactions to current medications since the last visit.  C/o uncontrolled headaches, on av rage 2 per week, not sufficient relief  From tyleonol  C/o weight gain with new job which demands less physical activity  ROS Denies recent fever or chills. Denies sinus pressure, nasal congestion, ear pain or sore throat. Denies chest congestion, productive cough or wheezing. Denies chest pains, palpitations and leg swelling Denies abdominal pain, nausea, vomiting,diarrhea or constipation.   Denies dysuria, frequency, hesitancy or incontinence. Denies joint pain, swelling and limitation in mobility. Denies  seizures, numbness, or tingling. Denies depression, anxiety or insomnia. Denies skin break down or rash.   PE  BP 120/78 (BP Location: Left Arm, Patient Position: Sitting, Cuff Size: Normal)   Pulse 75   Temp 98.5 F (36.9 C) (Other (Comment))   Resp 16   Ht  (1.651 m)   Wt 197 lb 4 oz (89.5 kg)   SpO2 98%   BMI 32.82 kg/m   Patient alert and oriented and in no cardiopulmonary distress.  HEENT: No facial asymmetry, EOMI,   oropharynx pink and moist.  Neck supple no JVD, no mass.  Chest: Clear to auscultation bilaterally.  CVS: S1, S2 no murmurs, no S3.Regular rate.  ABD: Soft non tender.   Ext: No edema  MS: Adequate ROM spine, shoulders, hips and knees.  Skin: Intact, no ulcerations or rash noted.  Psych: Good eye contact, normal affect. Memory intact not anxious or depressed appearing.  CNS: CN 2-12 intact, power,   normal throughout.no focal deficits noted.   Assessment & Plan  Essential hypertension Controlled, no change in medication DASH diet and commitment to daily physical activity for a minimum of 30 minutes discussed and encouraged, as a part of hypertension management. The importance of attaining a healthy weight is also discussed.  BP/Weight 10/31/2016 10/20/2015 07/08/2015 05/30/2015 12/28/2014 10/21/2014 06/23/2014  Systolic BP 120 112 140 120 128 106 102  Diastolic BP 78 74 85 78 80 74 68  Wt. (Lbs) 197.25 157 - 153 148.04 150 155.12  BMI 32.82 26.13 - 25.46 24.64 24.96 25.81       Headache Uncontrolled , has on average 5 per month  Start topamax and use imitrex  Hyperlipemia Hyperlipidemia:Low fat diet discussed and encouraged.   Lipid Panel  Lab Results  Component Value Date   CHOL 210 (H) 02/15/2015   HDL 79 02/15/2015   LDLCALC 115 02/15/2015   TRIG 81 02/15/2015   CHOLHDL 2.7 02/15/2015  Updated lab needed t.      Obesity (BMI 30.0-34.9) Deteriorated. Patient re-educated about  the importance of commitment to a  minimum of 150 minutes of exercise per week.  The importance of healthy food choices with portion control discussed. Encouraged to start a food diary, count calories and to consider  joining a support group. Sample diet sheets offered. Goals set by the patient for the next several months.   Weight /BMI 10/31/2016 10/20/2015 05/30/2015  WEIGHT 197 lb 4 oz 157 lb 153 lb  HEIGHT 5'  5"    BMI 32.82 kg/m2 26.13 kg/m2 25.46 kg/m2      Rhinitis, allergic Controlled, no change in medication

## 2016-11-14 ENCOUNTER — Other Ambulatory Visit: Payer: Self-pay | Admitting: Family Medicine

## 2016-12-21 ENCOUNTER — Other Ambulatory Visit: Payer: Self-pay

## 2016-12-21 MED ORDER — CYCLOBENZAPRINE HCL 10 MG PO TABS: 10.0000 mg | ORAL_TABLET | Freq: Every day | ORAL | 1 refills | Status: DC

## 2017-01-09 ENCOUNTER — Other Ambulatory Visit: Payer: Self-pay | Admitting: Family Medicine

## 2017-02-08 ENCOUNTER — Telehealth: Payer: Self-pay | Admitting: Family Medicine

## 2017-02-08 MED ORDER — TRIAMTERENE-HCTZ 75-50 MG PO TABS: 1.0000 | ORAL_TABLET | Freq: Every day | ORAL | 0 refills | Status: DC

## 2017-02-08 NOTE — Addendum Note (Signed)
Addended by: Mack HookJOHNSON, Vilda Zollner on: 02/08/2017 02:04 PM   Modules accepted: Orders

## 2017-02-08 NOTE — Telephone Encounter (Signed)
Pt needs a refill for triamterene-hydrochlorothiazide (MAXZIDE) 75-50 MG tablet asking for more than a 30 day supply so he doesn't have to call each month

## 2017-02-14 ENCOUNTER — Other Ambulatory Visit: Payer: Self-pay | Admitting: Family Medicine

## 2017-03-18 ENCOUNTER — Telehealth: Payer: Self-pay | Admitting: Family Medicine

## 2017-03-18 NOTE — Telephone Encounter (Signed)
Pt is coughing, and wants a nurse to call him, I suggested he go to the Urgent Care, and he advised he was not going to the Urgent Care or the Emergency room, he just wants a zpack

## 2017-03-18 NOTE — Telephone Encounter (Signed)
Called patient regarding message below. No answer, unable to leave message. Patient needs to be seen and evaluated to receive antibiotics.

## 2017-04-30 ENCOUNTER — Ambulatory Visit: Payer: Self-pay | Admitting: Family Medicine

## 2017-05-09 ENCOUNTER — Other Ambulatory Visit: Payer: Self-pay

## 2017-05-09 MED ORDER — TRIAMTERENE-HCTZ 75-50 MG PO TABS: 1.0000 | ORAL_TABLET | Freq: Every day | ORAL | 0 refills | Status: DC

## 2017-05-20 ENCOUNTER — Telehealth: Payer: Self-pay | Admitting: Family Medicine

## 2017-05-20 NOTE — Telephone Encounter (Signed)
Returned call and could not leave phone msg.  Mail box was full on pt phone.

## 2017-06-10 ENCOUNTER — Other Ambulatory Visit: Payer: Self-pay

## 2017-06-10 MED ORDER — TRIAMTERENE-HCTZ 75-50 MG PO TABS: 1.0000 | ORAL_TABLET | Freq: Every day | ORAL | 0 refills | Status: DC

## 2017-06-20 ENCOUNTER — Ambulatory Visit: Payer: Self-pay | Admitting: Family Medicine

## 2017-07-23 ENCOUNTER — Encounter: Payer: Self-pay | Admitting: Family Medicine

## 2017-07-23 ENCOUNTER — Ambulatory Visit: Payer: BC Managed Care – PPO | Admitting: Family Medicine

## 2017-07-23 VITALS — BP 120/84 | HR 82 | Resp 16 | Ht 65.0 in | Wt 190.0 lb

## 2017-07-23 DIAGNOSIS — E669 Obesity, unspecified: Secondary | ICD-10-CM

## 2017-07-23 DIAGNOSIS — I10 Essential (primary) hypertension: Secondary | ICD-10-CM

## 2017-07-23 DIAGNOSIS — J3089 Other allergic rhinitis: Secondary | ICD-10-CM | POA: Diagnosis not present

## 2017-07-23 DIAGNOSIS — E785 Hyperlipidemia, unspecified: Secondary | ICD-10-CM

## 2017-07-23 MED ORDER — IBUPROFEN 800 MG PO TABS: 800.0000 mg | ORAL_TABLET | Freq: Three times a day (TID) | ORAL | 0 refills | Status: DC | PRN

## 2017-07-23 MED ORDER — EPINEPHRINE 0.3 MG/0.3ML IJ SOAJ: 0.3000 mg | Freq: Once | INTRAMUSCULAR | 1 refills | Status: AC

## 2017-07-23 MED ORDER — TOPIRAMATE 100 MG PO TABS
100.0000 mg | ORAL_TABLET | Freq: Two times a day (BID) | ORAL | 5 refills | Status: DC
Start: 2017-07-23 — End: 2018-01-28

## 2017-07-23 NOTE — Progress Notes (Signed)
   Valetta CloseMaurice K Scarpelli Jr.     MRN: [id number]      DOB: 1980/10/06   HPI Devon Macdonald is here for follow up and re-evaluation of chronic medical conditions, medication management and review of any available recent lab and radiology data.   The PT denies any adverse reactions to current medications since the last visit.  intermittent left shoulder pain limiting movement to back x  2 years  H/o dermatitis, and severe bee allergy as a child, now exposed to bees on the job , requests epi pen  Headaches twice weekly will increase the dose of his topamax  ROS Denies recent fever or chills. Denies sinus pressure, nasal congestion, ear pain or sore throat. Denies chest congestion, productive cough or wheezing. Denies chest pains, palpitations and leg swelling Denies abdominal pain, nausea, vomiting,diarrhea or constipation.   Denies dysuria, frequency, hesitancy or incontinence. Denies joint pain, swelling and limitation in mobility. Denies  seizures, numbness, or tingling. Denies depression, anxiety or insomnia. Denies skin break down or rash.   PE  BP 120/84   Pulse 82   Resp 16   Ht 5\' 5"  (1.651 m)   Wt 190 lb (86.2 kg)   SpO2 98%   BMI 31.62 kg/m   Patient alert and oriented and in no cardiopulmonary distress.  HEENT: No facial asymmetry, EOMI,   oropharynx pink and moist.  Neck supple no JVD, no mass.  Chest: Clear to auscultation bilaterally.  CVS: S1, S2 no murmurs, no S3.Regular rate.  ABD: Soft non tender.   Ext: No edema  MS: Adequate ROM spine, shoulders, hips and knees.  Skin: Intact, no ulcerations or rash noted.  Psych: Good eye contact, normal affect. Memory intact not anxious or depressed appearing.  CNS: CN 2-12 intact, power,  normal throughout.no focal deficits noted.   Assessment & Plan  Essential hypertension Controlled, no change in medication DASH diet and commitment to daily physical activity for a minimum of 30 minutes discussed and  encouraged, as a part of hypertension management. The importance of attaining a healthy weight is also discussed.  BP/Weight 07/23/2017 10/31/2016 10/20/2015 07/08/2015 05/30/2015 12/28/2014 10/21/2014  Systolic BP 120 120 112 140 120 128 106  Diastolic BP 84 78 74 85 78 80 74  Wt. (Lbs) 190 197.25 157 - 153 148.04 150  BMI 31.62 32.82 26.13 - 25.46 24.64 24.96       Headache Inadequately controlled , reports twice weekly headaches, increase topamax to 100 mg twice dailty  Obesity (BMI 30.0-34.9) Improved. Patient re-educated about  the importance of commitment to a  minimum of 150 minutes of exercise per week.  The importance of healthy food choices with portion control discussed. Sample diet sheets offered. Goals set by the patient for the next several months.   Weight /BMI 07/23/2017 10/31/2016 10/20/2015  WEIGHT 190 lb 197 lb 4 oz 157 lb  HEIGHT 5\' 5"  5\' 5"  5\' 5"   BMI 31.62 kg/m2 32.82 kg/m2 26.13 kg/m2      Rhinitis, allergic Controlled, no change in medication   Hyperlipemia Hyperlipidemia:Low fat diet discussed and encouraged.   Lipid Panel  Lab Results  Component Value Date   CHOL 210 (H) 02/15/2015   HDL 79 02/15/2015   LDLCALC 115 02/15/2015   TRIG 81 02/15/2015   CHOLHDL 2.7 02/15/2015   Needs updated labs , 3 years overdue

## 2017-07-23 NOTE — Patient Instructions (Addendum)
Physical exam in end December , call if you need me before  Weight loss goal of 2 pounds/ month  Fasting labs this week please CBC, lipid, chem 7, TSH and vit D  Congrats on weight loss continue good work  Copywriter, advertisingCongrats on school progress Increased dose of Topamax, for headaches  Use the ibuprofen for 3 to 5 days for the right shoulder with mild bursitis  It is important that you exercise regularly at least 30 minutes 5 times a week. If you develop chest pain, have severe difficulty breathing, or feel very tired, stop exercising immediately and seek medical attention

## 2017-07-28 MED ORDER — TRIAMTERENE-HCTZ 75-50 MG PO TABS: 1.0000 | ORAL_TABLET | Freq: Every day | ORAL | 2 refills | Status: DC

## 2017-07-28 NOTE — Assessment & Plan Note (Signed)
Inadequately controlled , reports twice weekly headaches, increase topamax to 100 mg twice dailty

## 2017-07-28 NOTE — Assessment & Plan Note (Signed)
Hyperlipidemia:Low fat diet discussed and encouraged.   Lipid Panel  Lab Results  Component Value Date   CHOL 210 (H) 02/15/2015   HDL 79 02/15/2015   LDLCALC 115 02/15/2015   TRIG 81 02/15/2015   CHOLHDL 2.7 02/15/2015   Needs updated labs , 3 years overdue

## 2017-07-28 NOTE — Assessment & Plan Note (Signed)
Improved. Patient re-educated about  the importance of commitment to a  minimum of 150 minutes of exercise per week.  The importance of healthy food choices with portion control discussed. Sample diet sheets offered. Goals set by the patient for the next several months.   Weight /BMI 07/23/2017 10/31/2016 10/20/2015  WEIGHT 190 lb 197 lb 4 oz 157 lb  HEIGHT 5\' 5"  5\' 5"  5\' 5"   BMI 31.62 kg/m2 32.82 kg/m2 26.13 kg/m2

## 2017-07-28 NOTE — Assessment & Plan Note (Signed)
Controlled, no change in medication DASH diet and commitment to daily physical activity for a minimum of 30 minutes discussed and encouraged, as a part of hypertension management. The importance of attaining a healthy weight is also discussed.  BP/Weight 07/23/2017 10/31/2016 10/20/2015 07/08/2015 05/30/2015 12/28/2014 10/21/2014  Systolic BP 120 120 112 140 120 128 106  Diastolic BP 84 78 74 85 78 80 74  Wt. (Lbs) 190 197.25 157 - 153 148.04 150  BMI 31.62 32.82 26.13 - 25.46 24.64 24.96

## 2017-07-28 NOTE — Assessment & Plan Note (Signed)
Controlled, no change in medication  

## 2017-07-29 ENCOUNTER — Telehealth: Payer: Self-pay

## 2017-07-29 DIAGNOSIS — E782 Mixed hyperlipidemia: Secondary | ICD-10-CM

## 2017-07-29 DIAGNOSIS — I10 Essential (primary) hypertension: Secondary | ICD-10-CM

## 2017-07-29 DIAGNOSIS — R7989 Other specified abnormal findings of blood chemistry: Secondary | ICD-10-CM

## 2017-07-29 DIAGNOSIS — E669 Obesity, unspecified: Secondary | ICD-10-CM

## 2017-07-29 NOTE — Telephone Encounter (Signed)
Labs ordered per Dr.Simpson  

## 2017-08-10 LAB — CBC
HEMATOCRIT: 47.5 % (ref 38.5–50.0)
HEMOGLOBIN: 15.8 g/dL (ref 13.2–17.1)
MCH: 27.8 pg (ref 27.0–33.0)
MCHC: 33.3 g/dL (ref 32.0–36.0)
MCV: 83.6 fL (ref 80.0–100.0)
MPV: 11 fL (ref 7.5–12.5)
Platelets: 308 10*3/uL (ref 140–400)
RBC: 5.68 10*6/uL (ref 4.20–5.80)
RDW: 13 % (ref 11.0–15.0)
WBC: 6.1 10*3/uL (ref 3.8–10.8)

## 2017-08-10 LAB — LIPID PANEL
Cholesterol: 208 mg/dL — ABNORMAL HIGH (ref <200)
HDL: 48 mg/dL (ref >40)
LDL CHOLESTEROL (CALC): 135 mg/dL — AB
Non-HDL Cholesterol (Calc): 160 mg/dL (calc) — ABNORMAL HIGH (ref <130)
TRIGLYCERIDES: 129 mg/dL (ref <150)
Total CHOL/HDL Ratio: 4.3 (calc) (ref <5.0)

## 2017-08-10 LAB — COMPLETE METABOLIC PANEL WITH GFR
AG RATIO: 1.7 (calc) (ref 1.0–2.5)
ALKALINE PHOSPHATASE (APISO): 78 U/L (ref 40–115)
ALT: 37 U/L (ref 9–46)
AST: 20 U/L (ref 10–40)
Albumin: 4.5 g/dL (ref 3.6–5.1)
BUN: 17 mg/dL (ref 7–25)
CHLORIDE: 103 mmol/L (ref 98–110)
CO2: 24 mmol/L (ref 20–32)
CREATININE: 1.25 mg/dL (ref 0.60–1.35)
Calcium: 9.9 mg/dL (ref 8.6–10.3)
GFR, Est African American: 85 mL/min/{1.73_m2} (ref >=60)
GFR, Est Non African American: 74 mL/min/{1.73_m2} (ref >=60)
Globulin: 2.7 g/dL (calc) (ref 1.9–3.7)
Glucose, Bld: 104 mg/dL — ABNORMAL HIGH (ref 65–99)
POTASSIUM: 3.6 mmol/L (ref 3.5–5.3)
SODIUM: 140 mmol/L (ref 135–146)
Total Bilirubin: 0.6 mg/dL (ref 0.2–1.2)
Total Protein: 7.2 g/dL (ref 6.1–8.1)

## 2017-08-10 LAB — TSH: TSH: 3.67 m[IU]/L (ref 0.40–4.50)

## 2017-12-04 ENCOUNTER — Telehealth: Payer: Self-pay | Admitting: Family Medicine

## 2017-12-04 NOTE — Telephone Encounter (Signed)
Please send to [facility] [address]---Maxzide.Marland KitchenMarland KitchenMarland Kitchen

## 2017-12-06 ENCOUNTER — Other Ambulatory Visit: Payer: Self-pay

## 2017-12-06 MED ORDER — TRIAMTERENE-HCTZ 75-50 MG PO TABS: 1.0000 | ORAL_TABLET | Freq: Every day | ORAL | 2 refills | Status: DC

## 2017-12-06 NOTE — Telephone Encounter (Signed)
Maxzide refilled.

## 2017-12-30 ENCOUNTER — Encounter: Payer: Self-pay | Admitting: Family Medicine

## 2018-01-28 ENCOUNTER — Other Ambulatory Visit: Payer: Self-pay | Admitting: Family Medicine

## 2018-02-10 ENCOUNTER — Encounter: Payer: Self-pay | Admitting: Family Medicine

## 2018-05-11 ENCOUNTER — Other Ambulatory Visit: Payer: Self-pay | Admitting: Family Medicine

## 2018-07-10 ENCOUNTER — Ambulatory Visit (INDEPENDENT_AMBULATORY_CARE_PROVIDER_SITE_OTHER): Payer: BC Managed Care – PPO | Admitting: Family Medicine

## 2018-07-10 ENCOUNTER — Encounter: Payer: Self-pay | Admitting: Family Medicine

## 2018-07-10 VITALS — BP 112/70 | Ht 65.0 in | Wt 179.0 lb

## 2018-07-10 DIAGNOSIS — E669 Obesity, unspecified: Secondary | ICD-10-CM

## 2018-07-10 DIAGNOSIS — R7989 Other specified abnormal findings of blood chemistry: Secondary | ICD-10-CM

## 2018-07-10 DIAGNOSIS — I10 Essential (primary) hypertension: Secondary | ICD-10-CM | POA: Diagnosis not present

## 2018-07-10 DIAGNOSIS — E785 Hyperlipidemia, unspecified: Secondary | ICD-10-CM

## 2018-07-10 DIAGNOSIS — Z91030 Bee allergy status: Secondary | ICD-10-CM

## 2018-07-10 DIAGNOSIS — G43119 Migraine with aura, intractable, without status migrainosus: Secondary | ICD-10-CM

## 2018-07-10 MED ORDER — SUMATRIPTAN SUCCINATE 100 MG PO TABS: 100.0000 mg | ORAL_TABLET | ORAL | 0 refills | Status: DC | PRN

## 2018-07-10 MED ORDER — EPINEPHRINE 0.3 MG/0.3ML IJ SOAJ: 0.3000 mg | INTRAMUSCULAR | 2 refills | Status: DC | PRN

## 2018-07-10 NOTE — Assessment & Plan Note (Signed)
Controlled, no change in medication DASH diet and commitment to daily physical activity for a minimum of 30 minutes discussed and encouraged, as a part of hypertension management. The importance of attaining a healthy weight is also discussed.  BP/Weight 07/10/2018 07/23/2017 10/31/2016 10/20/2015 07/08/2015 05/30/2015 12/28/2014  Systolic BP 112 120 120 112 140 120 128  Diastolic BP 70 84 78 74 85 78 80  Wt. (Lbs) 179 190 197.25 157 - 153 148.04  BMI 29.79 31.62 32.82 26.13 - 25.46 24.64

## 2018-07-10 NOTE — Patient Instructions (Addendum)
Annual exam in 5 months, call if you need me sooner please  Congrats on weight loss because of healthy food choice,  Keep it up!  Please get CBC, fasting lipid, cmp and eGFR and TSH 1 week before your next visit  New script for immitrex and epi pen have been prescribed as we  Discussed  It is important that you exercise regularly at least 30 minutes 5 times a week. If you develop chest pain, have severe difficulty breathing, or feel very tired, stop exercising immediately and seek medical attention    Thanks for choosing [facility], we consider it a privelige to serve you.  Social distancing Maintain a 6 ft distance Frequent hand washing with soap and water Keeping your hands off of your face.Wear a face mask when you leave home These 3 practices will help to keep both you and your community healthy during this time. Please practice them faithfully!

## 2018-07-10 NOTE — Progress Notes (Signed)
Virtual Visit via Telephone Note  I connected with Devon CloseMaurice K Guedea Jr. on 07/10/18 at  3:40 PM EDT by telephone and verified that I am speaking with the correct person using two identifiers.  Location: Patient: home Provider: office   I discussed the limitations, risks, security and privacy concerns of performing an evaluation and management service by telephone and the availability of in person appointments. I also discussed with the patient that there may be a patient responsible charge related to this service. The patient expressed understanding and agreed to proceed.   History of Present Illness: F/U chronic problems, medication review, labs overdue. Requests epi pen due to bee allergy Stats doing well overall, tired of being homebound " with the kids" however , no intention to leave home unless deemed necessary Joined weight watchers in January , and doing very well with weight loss, not quite as active as when he is on the job Denies recent fever or chills. Denies sinus pressure, nasal congestion, ear pain or sore throat. Denies chest congestion, productive cough or wheezing. Denies chest pains, palpitations and leg swelling Denies abdominal pain, nausea, vomiting,diarrhea or constipation.   Denies dysuria, frequency, hesitancy or incontinence. Denies joint pain, swelling and limitation in mobility. Denies uncontrolled  headaches, using topamax only and has not filled/needed immitrex for 1 yearseizures, numbness, or tingling. Denies depression, anxiety or insomnia. Denies skin break down or rash.       Observations/Objective: BP 112/70   Ht 5\' 5"  (1.651 m)   Wt 179 lb (81.2 kg)   BMI 29.79 kg/m  Good communication with no confusion and intact memory. Alert and oriented x 3 No signs of respiratory distress during sppech   Assessment and Plan: Essential hypertension Controlled, no change in medication DASH diet and commitment to daily physical activity for a minimum  of 30 minutes discussed and encouraged, as a part of hypertension management. The importance of attaining a healthy weight is also discussed.  BP/Weight 07/10/2018 07/23/2017 10/31/2016 10/20/2015 07/08/2015 05/30/2015 12/28/2014  Systolic BP 112 120 120 112 140 120 128  Diastolic BP 70 84 78 74 85 78 80  Wt. (Lbs) 179 190 197.25 157 - 153 148.04  BMI 29.79 31.62 32.82 26.13 - 25.46 24.64       Migraine headache Maintained headache free on Topamax, will send in immitrex in the event he has breakthrough but reports not needing this for past 1 year  Obesity (BMI 30.0-34.9)  Improved, he is applauded on this and encouraged to continue same. Patient re-educated about  the importance of commitment to a  minimum of 150 minutes of exercise per week as able.  The importance of healthy food choices with portion control discussed, as well as eating regularly and within a 12 hour window most days. The need to choose "clean , green" food 50 to 75% of the time is discussed, as well as to make water the primary drink and set a goal of 64 ounces water daily.  Will continue with weight watchers and increase exercise commitment  Weight /BMI 07/10/2018 07/23/2017 10/31/2016  WEIGHT 179 lb 190 lb 197 lb 4 oz  HEIGHT 5\' 5"  5\' 5"  5\' 5"   BMI 29.79 kg/m2 31.62 kg/m2 32.82 kg/m2      Abnormal TSH Updated lab needed at/ before next visit.   Hyperlipemia Hyperlipidemia:Low fat diet discussed and encouraged.   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 The patient was provided an opportunity to ask questions and all were answered. The patient agreed with the plan and demonstrated an understanding of the instructions.   The  patient was advised to call back or seek an in-person evaluation if the symptoms worsen or if the condition fails to improve as anticipated.  I provided 22 minutes of non-face-to-face time during this encounter.   Devon Overman, MD

## 2018-07-12 ENCOUNTER — Encounter: Payer: Self-pay | Admitting: Family Medicine

## 2018-07-14 ENCOUNTER — Encounter: Payer: Self-pay | Admitting: Family Medicine

## 2018-07-14 DIAGNOSIS — Z91030 Bee allergy status: Secondary | ICD-10-CM | POA: Insufficient documentation

## 2018-07-14 NOTE — Assessment & Plan Note (Signed)
Updated lab needed at/ before next visit.   

## 2018-07-14 NOTE — Assessment & Plan Note (Signed)
Hyperlipidemia:Low fat diet discussed and encouraged.   Lipid Panel  Lab Results  Component Value Date   CHOL 208 (H) 08/09/2017   HDL 48 08/09/2017   LDLCALC 135 (H) 08/09/2017   TRIG 129 08/09/2017   CHOLHDL 4.3 08/09/2017

## 2018-07-14 NOTE — Assessment & Plan Note (Signed)
Improved, he is applauded on this and encouraged to continue same. Patient re-educated about  the importance of commitment to a  minimum of 150 minutes of exercise per week as able.  The importance of healthy food choices with portion control discussed, as well as eating regularly and within a 12 hour window most days. The need to choose "clean , green" food 50 to 75% of the time is discussed, as well as to make water the primary drink and set a goal of 64 ounces water daily.  Will continue with weight watchers and increase exercise commitment  Weight /BMI 07/10/2018 07/23/2017 10/31/2016  WEIGHT 179 lb 190 lb 197 lb 4 oz  HEIGHT 5\' 5"  5\' 5"  5\' 5"   BMI 29.79 kg/m2 31.62 kg/m2 32.82 kg/m2

## 2018-07-14 NOTE — Assessment & Plan Note (Signed)
Maintained headache free on Topamax, will send in immitrex in the event he has breakthrough but reports not needing this for past 1 year

## 2018-07-14 NOTE — Assessment & Plan Note (Signed)
Epipen prescribed.

## 2018-08-05 ENCOUNTER — Other Ambulatory Visit: Payer: Self-pay | Admitting: Family Medicine

## 2018-08-16 ENCOUNTER — Other Ambulatory Visit: Payer: Self-pay | Admitting: Family Medicine

## 2018-09-04 ENCOUNTER — Other Ambulatory Visit: Payer: Self-pay | Admitting: Family Medicine

## 2018-09-09 ENCOUNTER — Other Ambulatory Visit: Payer: Self-pay | Admitting: Family Medicine

## 2018-11-18 ENCOUNTER — Other Ambulatory Visit: Payer: Self-pay

## 2018-11-18 MED ORDER — TOPIRAMATE 100 MG PO TABS: 100.0000 mg | ORAL_TABLET | Freq: Two times a day (BID) | ORAL | 0 refills | Status: DC

## 2018-11-26 ENCOUNTER — Encounter: Payer: BC Managed Care – PPO | Admitting: Family Medicine

## 2019-01-01 ENCOUNTER — Telehealth: Payer: Self-pay

## 2019-02-09 ENCOUNTER — Other Ambulatory Visit: Payer: Self-pay

## 2019-02-09 ENCOUNTER — Ambulatory Visit: Payer: BC Managed Care – PPO | Admitting: Family Medicine

## 2019-02-09 ENCOUNTER — Encounter: Payer: Self-pay | Admitting: Family Medicine

## 2019-02-09 VITALS — BP 118/84 | HR 72 | Temp 98.2°F | Resp 15 | Ht 65.0 in | Wt 187.0 lb

## 2019-02-09 DIAGNOSIS — E669 Obesity, unspecified: Secondary | ICD-10-CM

## 2019-02-09 DIAGNOSIS — I10 Essential (primary) hypertension: Secondary | ICD-10-CM | POA: Diagnosis not present

## 2019-02-09 DIAGNOSIS — F439 Reaction to severe stress, unspecified: Secondary | ICD-10-CM | POA: Diagnosis not present

## 2019-02-09 DIAGNOSIS — G43119 Migraine with aura, intractable, without status migrainosus: Secondary | ICD-10-CM | POA: Diagnosis not present

## 2019-02-09 MED ORDER — ALPRAZOLAM 0.25 MG PO TABS: ORAL_TABLET | ORAL | 0 refills | Status: DC

## 2019-02-09 NOTE — Patient Instructions (Signed)
Annual exam in 6 months, call if you need me before  New for anxiety in extreme situations is low dose xanax , half to one tablet, 10 tabs to last 4 months  Behavioral management of stress as discussed goes a long way, also exercise commitment will helpp  Condolence re losses, and continue to get the support thast you are from those dear to you  It is important that you exercise regularly at least 30 minutes 5 times a week. If you develop chest pain, have severe difficulty breathing, or feel very tired, stop exercising immediately and seek medical attention    Think about what you will eat, plan ahead. Choose " clean, green, fresh or frozen" over canned, processed or packaged foods which are more sugary, salty and fatty. 70 to 75% of food eaten should be vegetables and fruit. Three meals at set times with snacks allowed between meals, but they must be fruit or vegetables. Aim to eat over a 12 hour period , example 7 am to 7 pm, and STOP after  your last meal of the day. Drink water,generally about 64 ounces per day, no other drink is as healthy. Fruit juice is best enjoyed in a healthy way, by EATING the fruit.

## 2019-02-12 ENCOUNTER — Encounter: Payer: Self-pay | Admitting: Family Medicine

## 2019-02-12 DIAGNOSIS — F439 Reaction to severe stress, unspecified: Secondary | ICD-10-CM | POA: Insufficient documentation

## 2019-02-12 NOTE — Assessment & Plan Note (Signed)
  Patient re-educated about  the importance of commitment to a  minimum of 150 minutes of exercise per week as able.  The importance of healthy food choices with portion control discussed, as well as eating regularly and within a 12 hour window most days. The need to choose "clean , green" food 50 to 75% of the time is discussed, as well as to make water the primary drink and set a goal of 64 ounces water daily.    Weight /BMI 02/09/2019 07/10/2018 07/23/2017  WEIGHT 187 lb 179 lb 190 lb  HEIGHT 5\' 5"  5\' 5"  5\' 5"   BMI 31.12 kg/m2 29.79 kg/m2 31.62 kg/m2

## 2019-02-12 NOTE — Progress Notes (Signed)
Devon Macdonald.     MRN: [id number]      DOB: 1980-10-19   HPI Devon Macdonald is here for follow up and re-evaluation of chronic medical conditions, medication management and review of any available recent lab and radiology data.  Preventive health is updated, specifically  Cancer screening and Immunization.   Questions or concerns regarding consultations or procedures which the PT has had in the interim are  addressed. The PT denies any adverse reactions to current medications since the last visit.  C/o increased stress and anxiety I the home. His 77 y/o daughter is developing an attitude, disobys instruction, squabbles over minor thisngs and recently raised hr voice at hi. He" lost it" raised his voice in turn and put her in time out. This after several days of similar episodes when she was " pushing the button" Denies depression, requests medication for very limited use when he loses control Concerned about weight gain, covid help responsible, dtarting to work on changing food choice and will recommit to more regular exercise  ROS Denies recent fever or chills. Denies sinus pressure, nasal congestion, ear pain or sore throat. Denies chest congestion, productive cough or wheezing. Denies chest pains, palpitations and leg swelling Denies abdominal pain, nausea, vomiting,diarrhea or constipation.   Denies dysuria, frequency, hesitancy or incontinence. Denies joint pain, swelling and limitation in mobility. Denies headaches, seizures, numbness, or tingling.  Denies skin break down or rash.   PE  BP 118/84   Pulse 72   Temp 98.2 F (36.8 C) (Temporal)   Resp 15   Ht 5\' 5"  (1.651 m)   Wt 187 lb (84.8 kg)   SpO2 98%   BMI 31.12 kg/m   Patient alert and oriented and in no cardiopulmonary distress.  HEENT: No facial asymmetry, EOMI,     Neck supple .  Chest: Clear to auscultation bilaterally.  CVS: S1, S2 no murmurs, no S3.Regular rate.  ABD: Soft non tender.   Ext: No  edema  MS: Adequate ROM spine, shoulders, hips and knees.  Skin: Intact, no ulcerations or rash noted.  Psych: Good eye contact, normal affect. Memory intact not anxious or depressed appearing.  CNS: CN 2-12 intact, power,  normal throughout.no focal deficits noted.   Assessment & Plan  Stress at home Increased , triggered by covid confinement, now having issues with pre teen daughter over small isues which result in screamoing matches. Parenting skills discussed and conflict management. Has great support from his wife. Limited access to low dose xanax 0.25 mg when needed for extreme aniety , 6 month follow up Aware of addictive potential and wary of this   Essential hypertension Controlled, no change in medication DASH diet and commitment to daily physical activity for a minimum of 30 minutes discussed and encouraged, as a part of hypertension management. The importance of attaining a healthy weight is also discussed.  BP/Weight 02/09/2019 07/10/2018 07/23/2017 10/31/2016 10/20/2015 07/08/2015 05/30/2015  Systolic BP 118 112 120 120 112 140 120  Diastolic BP 84 70 84 78 74 85 78  Wt. (Lbs) 187 179 190 197.25 157 - 153  BMI 31.12 29.79 31.62 32.82 26.13 - 25.46       Migraine headache Controlled, no change in medication   Obesity (BMI 30.0-34.9)  Patient re-educated about  the importance of commitment to a  minimum of 150 minutes of exercise per week as able.  The importance of healthy food choices with portion control discussed, as well as eating regularly  and within a 12 hour window most days. The need to choose "clean , green" food 50 to 75% of the time is discussed, as well as to make water the primary drink and set a goal of 64 ounces water daily.    Weight /BMI 02/09/2019 07/10/2018 07/23/2017  WEIGHT 187 lb 179 lb 190 lb  HEIGHT 5\' 5"  5\' 5"  5\' 5"   BMI 31.12 kg/m2 29.79 kg/m2 31.62 kg/m2

## 2019-02-12 NOTE — Assessment & Plan Note (Signed)
Increased , triggered by covid confinement, now having issues with pre teen daughter over small isues which result in screamoing matches. Parenting skills discussed and conflict management. Has great support from his wife. Limited access to low dose xanax 0.25 mg when needed for extreme aniety , 6 month follow up Aware of addictive potential and wary of this

## 2019-02-12 NOTE — Assessment & Plan Note (Signed)
Controlled, no change in medication DASH diet and commitment to daily physical activity for a minimum of 30 minutes discussed and encouraged, as a part of hypertension management. The importance of attaining a healthy weight is also discussed.  BP/Weight 02/09/2019 07/10/2018 07/23/2017 10/31/2016 10/20/2015 07/08/2015 3/56/8616  Systolic BP 837 290 211 155 208 022 336  Diastolic BP 84 70 84 78 74 85 78  Wt. (Lbs) 187 179 190 197.25 157 - 153  BMI 31.12 29.79 31.62 32.82 26.13 - 25.46

## 2019-02-12 NOTE — Assessment & Plan Note (Signed)
Controlled, no change in medication  

## 2019-02-22 ENCOUNTER — Other Ambulatory Visit: Payer: Self-pay | Admitting: Family Medicine

## 2019-03-16 ENCOUNTER — Other Ambulatory Visit: Payer: Self-pay

## 2019-03-16 ENCOUNTER — Telehealth: Payer: Self-pay | Admitting: *Deleted

## 2019-03-16 MED ORDER — TRIAMTERENE-HCTZ 75-50 MG PO TABS: 1.0000 | ORAL_TABLET | Freq: Every day | ORAL | 2 refills | Status: DC

## 2019-03-16 NOTE — Telephone Encounter (Signed)
Pt called needing a refill on his bp medication

## 2019-03-16 NOTE — Telephone Encounter (Signed)
Medication refilled and sent to pharmacy.

## 2019-03-30 ENCOUNTER — Ambulatory Visit (INDEPENDENT_AMBULATORY_CARE_PROVIDER_SITE_OTHER): Payer: BC Managed Care – PPO | Admitting: Family Medicine

## 2019-03-30 ENCOUNTER — Other Ambulatory Visit: Payer: Self-pay

## 2019-03-30 VITALS — BP 118/70 | Ht 65.0 in | Wt 185.0 lb

## 2019-03-30 DIAGNOSIS — G43119 Migraine with aura, intractable, without status migrainosus: Secondary | ICD-10-CM

## 2019-03-30 DIAGNOSIS — I10 Essential (primary) hypertension: Secondary | ICD-10-CM

## 2019-03-30 DIAGNOSIS — R0683 Snoring: Secondary | ICD-10-CM | POA: Diagnosis not present

## 2019-03-30 DIAGNOSIS — F439 Reaction to severe stress, unspecified: Secondary | ICD-10-CM | POA: Diagnosis not present

## 2019-03-30 DIAGNOSIS — E785 Hyperlipidemia, unspecified: Secondary | ICD-10-CM

## 2019-03-30 NOTE — Patient Instructions (Signed)
F/U as before , call if you need me sooner  You are referred to lung specialist, who will determine if you have sleep apnea  Congrats and best wishes on your new job,m you may sign to have your records sent where you need them to go  Think about what you will eat, plan ahead. Choose " clean, green, fresh or frozen" over canned, processed or packaged foods which are more sugary, salty and fatty. 70 to 75% of food eaten should be vegetables and fruit. Three meals at set times with snacks allowed between meals, but they must be fruit or vegetables. Aim to eat over a 12 hour period , example 7 am to 7 pm, and STOP after  your last meal of the day. Drink water,generally about 64 ounces per day, no other drink is as healthy. Fruit juice is best enjoyed in a healthy way, by EATING the fruit. It is important that you exercise regularly at least 30 minutes 5 times a week. If you develop chest pain, have severe difficulty breathing, or feel very tired, stop exercising immediately and seek medical attention  Thanks for choosing [facility], we consider it a privelige to serve you.

## 2019-03-30 NOTE — Progress Notes (Signed)
Virtual Visit via Telephone Note  I connected with Devon Macdonald. on 03/30/19 at  3:40 PM EST by telephone and verified that I am speaking with the correct person using two identifiers.  Location: Patient: away from office Provider: office   I discussed the limitations, risks, security and privacy concerns of performing an evaluation and management service by telephone and the availability of in person appointments. I also discussed with the patient that there may be a patient responsible charge related to this service. The patient expressed understanding and agreed to proceed.   History of Present Illness:   F/U chronic problems, medication review, and refill medication when necessary. Review most recent labs and order labs which are due Review preventive health and update with necessary referrals or immunizations as indicated Main eason for calling is that he was recently ADVISED BY HIS DENTIST OF THE NEED TO GET TESTED FOR SLEEP APNEA, STATES HIS WIFE DOES REPORT SNORING, STATES HE OFTEN WAKES UP FEELING tIRED Migraines ae controlled , has not used rescue medication recently Improved home stress and anxiety, has used xanax once  Observations/Objective: BP 118/70   Ht 5\' 5"  (1.651 m)   Wt 185 lb (83.9 kg)   BMI 30.79 kg/m  Good communication with no confusion and intact memory. Alert and oriented x 3 No signs of respiratory distress during speech   Assessment and Plan:  Snoring Reports snoring and fatigue, refer for sleep study  Stress at home Improved, has used xanax once reportedly  Migraine headache Controlled on prophylactic medication  Essential hypertension Controlled, no change in medication   Hyperlipemia Hyperlipidemia:Low fat diet discussed and encouraged.   Lipid Panel  Lab Results  Component Value Date   CHOL 208 (H) 08/09/2017   HDL 48 08/09/2017   LDLCALC 135 (H) 08/09/2017   TRIG 129 08/09/2017   CHOLHDL 4.3 08/09/2017    Uncontrolled Updated lab needed at/ before next visit.      Follow Up Instructions:    I discussed the assessment and treatment plan with the patient. The patient was provided an opportunity to ask questions and all were answered. The patient agreed with the plan and demonstrated an understanding of the instructions.   The patient was advised to call back or seek an in-person evaluation if the symptoms worsen or if the condition fails to improve as anticipated.  I provided 15 minutes of non-face-to-face time during this encounter.   08/11/2017, MD

## 2019-04-01 ENCOUNTER — Encounter: Payer: Self-pay | Admitting: Family Medicine

## 2019-04-01 NOTE — Assessment & Plan Note (Signed)
Reports snoring and fatigue, refer for sleep study

## 2019-04-01 NOTE — Assessment & Plan Note (Signed)
Hyperlipidemia:Low fat diet discussed and encouraged.   Lipid Panel  Lab Results  Component Value Date   CHOL 208 (H) 08/09/2017   HDL 48 08/09/2017   LDLCALC 135 (H) 08/09/2017   TRIG 129 08/09/2017   CHOLHDL 4.3 08/09/2017   Uncontrolled Updated lab needed at/ before next visit.

## 2019-04-01 NOTE — Assessment & Plan Note (Signed)
Controlled on prophylactic medication

## 2019-04-01 NOTE — Assessment & Plan Note (Signed)
Improved, has used xanax once reportedly

## 2019-04-01 NOTE — Assessment & Plan Note (Signed)
Controlled, no change in medication  

## 2019-05-14 ENCOUNTER — Telehealth: Payer: Self-pay | Admitting: *Deleted

## 2019-05-14 NOTE — Telephone Encounter (Signed)
Pt just wanted to update record that he had covid vaccine first shot 04-14-19 and second shot 05-08-19

## 2019-05-21 ENCOUNTER — Telehealth: Payer: Self-pay | Admitting: *Deleted

## 2019-05-21 NOTE — Telephone Encounter (Signed)
Please place this for immediate referral and call them about it.

## 2019-05-21 NOTE — Telephone Encounter (Signed)
Pt called wanting to speak with someone as he cares for his 38 year old nephew and the nephew tried to commit suicide after taking pts car. He is already on anxiety medications was just wanting to talk to someone. Pt cannot get off work to travel but really wanted to speak to someone either virtually or over the phone. Can you place behavioral health referral for telepsych for pt?

## 2019-05-22 NOTE — Telephone Encounter (Signed)
Referral was place to Dr Bosie Clos office is closed due to Good Friday LVM to schedule pt reached out to pt he stated that he was fine and hanging in there he has anxiety meds if needed advised if he needed anything over the weekend to please go to ER and that I would follow up with Dr Bosie Clos on Tuesday when our office reopens with verbal understanding.

## 2019-05-27 ENCOUNTER — Other Ambulatory Visit: Payer: Self-pay | Admitting: Family Medicine

## 2019-05-28 ENCOUNTER — Telehealth: Payer: Self-pay

## 2019-05-28 NOTE — Telephone Encounter (Signed)
Urgent referral to [facility]/ any similar, is the new Psychologist [place]( who is in our office)taking virtual appt ,  You need to specifically document if Jhonny is himself suicidaL OR HOMICIDAL , AND IF SO NEEDS TO BE DIRECTED TO HOSPITAL FOLLOWING UP IS the most important to ensure that appointment with mental health professional is obtained and pt is aware

## 2019-05-28 NOTE — Telephone Encounter (Signed)
Asked for help from office manager to help expedite referral

## 2019-05-28 NOTE — Telephone Encounter (Signed)
Patient had called last week requesting an urgent referral for virtual psychology due to his nephew (whom he has been raising)  shot himself. He survived but has a long road ahead of him and Devon Macdonald is needing someone to talk to because he's taking it really hard. [name] referred him urgently to behavioral health but he called today and hasn't heard anything and new pt appts are pushed out pretty far. Can we place a routine referral to [facility] Carmela Hurt to call him on the phone? Or what do you recommend?

## 2019-05-28 NOTE — Telephone Encounter (Signed)
Pt in for tomorrow am per [facility]

## 2019-05-28 NOTE — Telephone Encounter (Signed)
Patient is not suicidal or homicidal and referral is sent

## 2019-05-29 ENCOUNTER — Ambulatory Visit (INDEPENDENT_AMBULATORY_CARE_PROVIDER_SITE_OTHER): Payer: BC Managed Care – PPO | Admitting: Psychologist

## 2019-05-29 DIAGNOSIS — F430 Acute stress reaction: Secondary | ICD-10-CM | POA: Diagnosis not present

## 2019-06-03 ENCOUNTER — Ambulatory Visit: Payer: Self-pay | Admitting: Psychologist

## 2019-06-05 ENCOUNTER — Ambulatory Visit (INDEPENDENT_AMBULATORY_CARE_PROVIDER_SITE_OTHER): Payer: BC Managed Care – PPO | Admitting: Psychologist

## 2019-06-05 DIAGNOSIS — F430 Acute stress reaction: Secondary | ICD-10-CM | POA: Diagnosis not present

## 2019-06-10 ENCOUNTER — Ambulatory Visit (INDEPENDENT_AMBULATORY_CARE_PROVIDER_SITE_OTHER): Payer: BC Managed Care – PPO | Admitting: Internal Medicine

## 2019-06-10 ENCOUNTER — Encounter: Payer: Self-pay | Admitting: Internal Medicine

## 2019-06-10 ENCOUNTER — Other Ambulatory Visit: Payer: Self-pay

## 2019-06-10 VITALS — BP 118/78 | HR 74 | Temp 98.1°F | Ht 65.0 in | Wt 192.4 lb

## 2019-06-10 DIAGNOSIS — G4733 Obstructive sleep apnea (adult) (pediatric): Secondary | ICD-10-CM | POA: Diagnosis not present

## 2019-06-10 DIAGNOSIS — R0683 Snoring: Secondary | ICD-10-CM

## 2019-06-10 DIAGNOSIS — E669 Obesity, unspecified: Secondary | ICD-10-CM

## 2019-06-10 NOTE — Patient Instructions (Signed)
Order- schedule HST  Dx OSA, snoring  Please call me about 2 weeks after your sleep study, to see if results and recommendations are ready yet. Iff appropriate, we may be able to start treatment before we see you next

## 2019-06-10 NOTE — Progress Notes (Signed)
06/10/19- 26 yoM married,  never smoker for sleep evaluation referred courtesy of Dr Tula Nakayama for complaint of snoring and fatigue. Medical problem list includes HTN, Migraine, Allergic Rhinitis, Honey Bee Venom Allergy, Hyperlipemia, Obesity Body weight today 192 lbs Epworth Score- 10 Prior sleep study- None Dentist noted evidence of bruxism. He c/o loud snoring, hiccups in sleep. Denies reflux. No sleep meds or caffeine. Often "exhausted".Works Engineer, technical sales- sedentary computer work.  Mother, sister and uncle on CPAP.   Prior to Admission medications   Medication Sig Start Date End Date Taking? Authorizing Provider  EPINEPHrine 0.3 mg/0.3 mL IJ SOAJ injection Inject 0.3 mLs (0.3 mg total) into the muscle as needed for anaphylaxis. 07/10/18  Yes Fayrene Helper, MD  montelukast (SINGULAIR) 10 MG tablet Take 1 tablet (10 mg total) by mouth at bedtime. 01/30/16  Yes Fayrene Helper, MD  Multiple Vitamin (MULTIVITAMIN WITH MINERALS) TABS Take 1 tablet by mouth daily.   Yes [provider]  SUMAtriptan (IMITREX) 100 MG tablet TAKE 1 TAB AT ONSET OF HEADACHE. MAY REPEAT ONCE IN 2 HOURS IF HEADACHE PERSISTS. MAX 2 TIMES A WEEK 09/09/18  Yes Perlie Mayo, NP  topiramate (TOPAMAX) 100 MG tablet TAKE 1 TABLET BY MOUTH TWICE A DAY 05/27/19  Yes Fayrene Helper, MD  triamterene-hydrochlorothiazide (MAXZIDE) 75-50 MG tablet Take 1 tablet by mouth daily. 03/16/19  Yes Fayrene Helper, MD  albuterol (VENTOLIN HFA) 108 (90 Base) MCG/ACT inhaler Inhale into the lungs. 03/19/17   [provider]  ALPRAZolam Duanne Moron) 0.25 MG tablet Take one tablet by mouth, at bedtime, as needed, for extreme anxiety 06/11/19   Fayrene Helper, MD  fluticasone [facility]) 50 MCG/ACT nasal spray Place into the nose. 03/19/17   [provider]   Past Medical History:  Diagnosis Date  . Elevated LFTs   . Epididymitis   . Hypertension   . Obesity    Past Surgical History:  Procedure  Laterality Date  . Broke right femur  1997  . broke right hand  2003  . lithrostripsy  2003   Family History  Problem Relation Age of Onset  . Diabetes Mother   . Diabetes Father    Social History   Socioeconomic History  . Marital status: Married    Spouse name: Not on file  . Number of children: Not on file  . Years of education: Not on file  . Highest education level: Not on file  Occupational History  . Not on file  Tobacco Use  . Smoking status: Never Smoker  . Smokeless tobacco: Never Used  Substance and Sexual Activity  . Alcohol use: No  . Drug use: No  . Sexual activity: Yes  Other Topics Concern  . Not on file  Social History Narrative  . Not on file   Social Determinants of Health   Financial Resource Strain:   . Difficulty of Paying Living Expenses:   Food Insecurity:   . Worried About Charity fundraiser in the Last Year:   . Arboriculturist in the Last Year:   Transportation Needs:   . Film/video editor (Medical):   Marland Kitchen Lack of Transportation (Non-Medical):   Physical Activity:   . Days of Exercise per Week:   . Minutes of Exercise per Session:   Stress:   . Feeling of Stress :   Social Connections:   . Frequency of Communication with Friends and Family:   . Frequency of Social Gatherings with Friends  and Family:   . Attends Religious Services:   . Active Member of Clubs or Organizations:   . Attends Banker Meetings:   Marland Kitchen Marital Status:   Intimate Partner Violence:   . Fear of Current or Ex-Partner:   . Emotionally Abused:   Marland Kitchen Physically Abused:   . Sexually Abused:    ROS-see HPI   + = positive Constitutional:    weight loss, night sweats, fevers, chills, +fatigue, lassitude. HEENT:    headaches, difficulty swallowing, tooth/dental problems, sore throat,       sneezing, itching, ear ache, nasal congestion, post nasal drip, snoring CV:    chest pain, orthopnea, PND, swelling in lower extremities, anasarca,                                   dizziness, palpitations Resp:   shortness of breath with exertion or at rest.                productive cough,   non-productive cough, coughing up of blood.              change in color of mucus.  wheezing.   Skin:    rash or lesions. GI:  No-   heartburn, indigestion, abdominal pain, nausea, vomiting, diarrhea,                 change in bowel habits, loss of appetite GU: dysuria, change in color of urine, no urgency or frequency.   flank pain. MS:   joint pain, stiffness, decreased range of motion, back pain. Neuro-     nothing unusual Psych:  change in mood or affect.  depression or anxiety.   memory loss.  OBJ- Physical Exam General- Alert, Oriented, Affect-appropriate, Distress- none acute Skin- rash-none, lesions- none, excoriation- none Lymphadenopathy- none Head- atraumatic            Eyes- Gross vision intact, PERRLA, conjunctivae and secretions clear            Ears- Hearing, canals-normal            Nose- Clear, no-Septal dev, mucus, polyps, erosion, perforation             Throat- Mallampati IV , mucosa clear , drainage- none, tonsils- atrophic, + teeth, Neck- flexible , trachea midline, no stridor , thyroid nl, carotid no bruit Chest - symmetrical excursion , unlabored           Heart/CV- RRR , no murmur , no gallop  , no rub, nl s1 s2                           - JVD- none , edema- none, stasis changes- none, varices- none           Lung- clear to P&A, wheeze- none, cough- none , dullness-none, rub- none           Chest wall-  Abd-  Br/ Gen/ Rectal- Not done, not indicated Extrem- cyanosis- none, clubbing, none, atrophy- none, strength- nl Neuro- grossly intact to observation

## 2019-06-11 ENCOUNTER — Other Ambulatory Visit: Payer: Self-pay | Admitting: Family Medicine

## 2019-06-11 ENCOUNTER — Ambulatory Visit (INDEPENDENT_AMBULATORY_CARE_PROVIDER_SITE_OTHER): Payer: BC Managed Care – PPO | Admitting: Psychologist

## 2019-06-11 ENCOUNTER — Telehealth: Payer: Self-pay

## 2019-06-11 ENCOUNTER — Telehealth: Payer: Self-pay | Admitting: Internal Medicine

## 2019-06-11 DIAGNOSIS — F430 Acute stress reaction: Secondary | ICD-10-CM | POA: Diagnosis not present

## 2019-06-11 MED ORDER — ALPRAZOLAM 0.25 MG PO TABS: ORAL_TABLET | ORAL | 0 refills | Status: DC

## 2019-06-11 NOTE — Telephone Encounter (Signed)
Spoke with patient. He stated that he was calling to let Dr. Maple Hudson know that he stopped breathing in his sleep last night. It startled him and woke him up. Once he was awake, it took him about 2 minutes to recover. He was able to go back to sleep.   I asked him how he was feeling this morning. He stated that he felt ok. He was slightly tired but nothing major. He just wanted to let Dr. Maple Hudson know.   Dr. Maple Hudson, please advise.

## 2019-06-11 NOTE — Telephone Encounter (Signed)
Pls let him know I am thankful therapy is helping, he needs to continue this. I have sent a limited supply of xanax as before.

## 2019-06-11 NOTE — Telephone Encounter (Signed)
Spoke with patient, he is aware CY has reviewed his message.  Nothing further needed at time of call.

## 2019-06-11 NOTE — Telephone Encounter (Signed)
Patient aware.

## 2019-06-11 NOTE — Telephone Encounter (Signed)
Patient has been seeing therapist and its helping but he is having to are now for his nephew after the shooting and he is having to learn everything again and is in bad shape still but he is hanging in there but was needing a little something for his nerves, York Spaniel you had gave him a temp supply of nerve med in the past and it helped tremendously and wants something sent to [facility] [address]. Please advise

## 2019-06-11 NOTE — Telephone Encounter (Signed)
Ok thanks- that's why we have ordered a sleep study

## 2019-06-23 ENCOUNTER — Encounter: Payer: Self-pay | Admitting: Internal Medicine

## 2019-06-23 ENCOUNTER — Ambulatory Visit (INDEPENDENT_AMBULATORY_CARE_PROVIDER_SITE_OTHER): Payer: BC Managed Care – PPO | Admitting: Psychologist

## 2019-06-23 DIAGNOSIS — F430 Acute stress reaction: Secondary | ICD-10-CM | POA: Diagnosis not present

## 2019-06-23 NOTE — Assessment & Plan Note (Signed)
Discussed sleep, sleep apnea, testing, medical concerns and therapies, driving safety. Plan- sleep study then ? CPAP

## 2019-06-23 NOTE — Assessment & Plan Note (Signed)
Importance of normalized weight discussed relative to HTN and possible OSA

## 2019-07-10 ENCOUNTER — Ambulatory Visit (INDEPENDENT_AMBULATORY_CARE_PROVIDER_SITE_OTHER): Payer: BC Managed Care – PPO | Admitting: Psychologist

## 2019-07-10 DIAGNOSIS — F430 Acute stress reaction: Secondary | ICD-10-CM | POA: Diagnosis not present

## 2019-07-22 ENCOUNTER — Telehealth: Payer: Self-pay | Admitting: Internal Medicine

## 2019-07-22 NOTE — Telephone Encounter (Signed)
Patient has been scheduled to pick up HST machine on 07/23/19 @ 4:30pm and patient is aware of appt

## 2019-07-22 NOTE — Telephone Encounter (Signed)
LVM for patient to return call to schedule the HST pick up

## 2019-07-22 NOTE — Telephone Encounter (Signed)
Synetta Fail has pulled this order to scheduled

## 2019-07-23 ENCOUNTER — Other Ambulatory Visit: Payer: Self-pay

## 2019-07-23 DIAGNOSIS — G4733 Obstructive sleep apnea (adult) (pediatric): Secondary | ICD-10-CM

## 2019-07-23 DIAGNOSIS — R0683 Snoring: Secondary | ICD-10-CM

## 2019-07-28 ENCOUNTER — Ambulatory Visit (INDEPENDENT_AMBULATORY_CARE_PROVIDER_SITE_OTHER): Payer: BC Managed Care – PPO | Admitting: Psychologist

## 2019-07-28 ENCOUNTER — Telehealth: Payer: Self-pay | Admitting: Internal Medicine

## 2019-07-28 DIAGNOSIS — G4733 Obstructive sleep apnea (adult) (pediatric): Secondary | ICD-10-CM

## 2019-07-28 DIAGNOSIS — F430 Acute stress reaction: Secondary | ICD-10-CM | POA: Diagnosis not present

## 2019-07-28 NOTE — Telephone Encounter (Signed)
lmtcb X1 for pt. I do not see where HST has been resulted just yet.  It seems that test was done on 07/23/19 and typically results aren't available until 1-2 weeks after test.

## 2019-07-29 DIAGNOSIS — G4733 Obstructive sleep apnea (adult) (pediatric): Secondary | ICD-10-CM

## 2019-07-29 NOTE — Telephone Encounter (Signed)
Spoke with the pt and notified that results have not been reviewed yet  Please advise on results once you have reviewed, thanks!

## 2019-07-29 NOTE — Telephone Encounter (Signed)
Called pt and advised message from the provider. Pt understood and verbalized understanding. Nothing further is needed.   CPAP ordered.  

## 2019-07-29 NOTE — Telephone Encounter (Signed)
Home sleep test showed obstructive sleep apnea, averaging about 9 apneas/ hour, with drops in blood oxygen level.  I recommend we order new DME, new CPAP auto 5-15, mask of choice, humidifier, supplies, AirView/ card. Please make sure he has a return ov in 31- 90 days per insurance regs.

## 2019-08-10 ENCOUNTER — Ambulatory Visit: Payer: BC Managed Care – PPO | Admitting: Family Medicine

## 2019-08-14 ENCOUNTER — Ambulatory Visit (INDEPENDENT_AMBULATORY_CARE_PROVIDER_SITE_OTHER): Payer: BC Managed Care – PPO | Admitting: Psychologist

## 2019-08-14 DIAGNOSIS — F430 Acute stress reaction: Secondary | ICD-10-CM

## 2019-08-18 ENCOUNTER — Telehealth: Payer: Self-pay | Admitting: Family Medicine

## 2019-08-18 ENCOUNTER — Ambulatory Visit (INDEPENDENT_AMBULATORY_CARE_PROVIDER_SITE_OTHER): Payer: BC Managed Care – PPO | Admitting: Psychologist

## 2019-08-18 DIAGNOSIS — F430 Acute stress reaction: Secondary | ICD-10-CM

## 2019-08-18 NOTE — Telephone Encounter (Signed)
He spoke with marion yesterday and was advised he could go ahead and schedule with [facility] and patient understood

## 2019-08-18 NOTE — Telephone Encounter (Signed)
Please call patient and let him know that I received information that he was seen in urgent care with thoracic spine pain.  If he still having significant pain I recommend he see orthopedics per the note.  We can refer him to orthopedics doctor of his choice please get the information and follow through thank you

## 2019-08-20 ENCOUNTER — Telehealth: Payer: Self-pay

## 2019-08-20 ENCOUNTER — Ambulatory Visit: Payer: Self-pay

## 2019-08-20 ENCOUNTER — Encounter: Payer: Self-pay | Admitting: Surgery

## 2019-08-20 ENCOUNTER — Ambulatory Visit (INDEPENDENT_AMBULATORY_CARE_PROVIDER_SITE_OTHER): Payer: BC Managed Care – PPO | Admitting: Surgery

## 2019-08-20 DIAGNOSIS — M542 Cervicalgia: Secondary | ICD-10-CM | POA: Diagnosis not present

## 2019-08-20 DIAGNOSIS — M5412 Radiculopathy, cervical region: Secondary | ICD-10-CM

## 2019-08-20 MED ORDER — METHYLPREDNISOLONE 4 MG PO TABS: ORAL_TABLET | ORAL | 0 refills | Status: DC

## 2019-08-20 MED ORDER — METHOCARBAMOL 500 MG PO TABS: 500.0000 mg | ORAL_TABLET | Freq: Three times a day (TID) | ORAL | 0 refills | Status: DC | PRN

## 2019-08-20 NOTE — Telephone Encounter (Signed)
Toradol 30 mg (1cc) given on left buttock & Depo 80mg  (1cc) given on right buttock. Patient tolerated it well.
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
## 2019-08-22 ENCOUNTER — Other Ambulatory Visit: Payer: Self-pay

## 2019-08-22 ENCOUNTER — Ambulatory Visit
Admission: RE | Admit: 2019-08-22 | Discharge: 2019-08-22 | Disposition: A | Discharge status: Home / Self Care | Payer: BC Managed Care – PPO | Source: Ambulatory Visit | Attending: Surgery | Admitting: Surgery

## 2019-08-22 DIAGNOSIS — M542 Cervicalgia: Secondary | ICD-10-CM

## 2019-08-22 DIAGNOSIS — M5412 Radiculopathy, cervical region: Secondary | ICD-10-CM

## 2019-08-25 ENCOUNTER — Encounter: Payer: Self-pay | Admitting: Orthopaedic Surgery

## 2019-08-25 ENCOUNTER — Ambulatory Visit (INDEPENDENT_AMBULATORY_CARE_PROVIDER_SITE_OTHER): Payer: BC Managed Care – PPO | Admitting: Orthopaedic Surgery

## 2019-08-25 ENCOUNTER — Other Ambulatory Visit: Payer: Self-pay

## 2019-08-25 VITALS — BP 123/79 | HR 77 | Ht 66.0 in | Wt 195.0 lb

## 2019-08-25 DIAGNOSIS — S161XXA Strain of muscle, fascia and tendon at neck level, initial encounter: Secondary | ICD-10-CM | POA: Insufficient documentation

## 2019-08-25 DIAGNOSIS — M62838 Other muscle spasm: Secondary | ICD-10-CM

## 2019-08-25 DIAGNOSIS — S161XXD Strain of muscle, fascia and tendon at neck level, subsequent encounter: Secondary | ICD-10-CM
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
## 2019-09-01 ENCOUNTER — Telehealth: Payer: Self-pay | Admitting: Orthopaedic Surgery

## 2019-09-01 NOTE — Telephone Encounter (Signed)
Ok for PT thanks

## 2019-09-01 NOTE — Telephone Encounter (Signed)
Please advise,

## 2019-09-01 NOTE — Telephone Encounter (Signed)
Patient called. He would like a referral to a chiropractor or a referral for physical therapy. His call back number is [phone]

## 2019-09-02 ENCOUNTER — Other Ambulatory Visit: Payer: Self-pay

## 2019-09-02 ENCOUNTER — Other Ambulatory Visit: Payer: Self-pay | Admitting: Family Medicine

## 2019-09-02 DIAGNOSIS — M542 Cervicalgia: Secondary | ICD-10-CM

## 2019-09-02 DIAGNOSIS — S161XXD Strain of muscle, fascia and tendon at neck level, subsequent encounter: Secondary | ICD-10-CM

## 2019-09-02 DIAGNOSIS — M5412 Radiculopathy, cervical region: Secondary | ICD-10-CM

## 2019-09-02 DIAGNOSIS — M62838 Other muscle spasm: Secondary | ICD-10-CM

## 2019-09-02 NOTE — Telephone Encounter (Signed)
Patient aware we will send him to PT in [place] area

## 2019-09-03 ENCOUNTER — Ambulatory Visit (INDEPENDENT_AMBULATORY_CARE_PROVIDER_SITE_OTHER): Payer: BC Managed Care – PPO | Admitting: Psychologist

## 2019-09-03 DIAGNOSIS — F430 Acute stress reaction: Secondary | ICD-10-CM

## 2019-09-08 ENCOUNTER — Ambulatory Visit: Payer: BC Managed Care – PPO | Admitting: Orthopaedic Surgery

## 2019-09-15 ENCOUNTER — Ambulatory Visit (INDEPENDENT_AMBULATORY_CARE_PROVIDER_SITE_OTHER): Payer: BC Managed Care – PPO | Admitting: Psychologist

## 2019-09-15 DIAGNOSIS — F430 Acute stress reaction: Secondary | ICD-10-CM

## 2019-09-17 ENCOUNTER — Ambulatory Visit: Payer: BC Managed Care – PPO | Admitting: Internal Medicine

## 2019-09-22 ENCOUNTER — Encounter: Payer: Self-pay | Admitting: Physical Therapy

## 2019-09-22 ENCOUNTER — Ambulatory Visit: Payer: BC Managed Care – PPO | Attending: Orthopaedic Surgery | Admitting: Physical Therapy

## 2019-09-22 ENCOUNTER — Other Ambulatory Visit: Payer: Self-pay

## 2019-09-22 DIAGNOSIS — M542 Cervicalgia: Secondary | ICD-10-CM | POA: Insufficient documentation

## 2019-09-22 DIAGNOSIS — R29898 Other symptoms and signs involving the musculoskeletal system: Secondary | ICD-10-CM | POA: Diagnosis present

## 2019-09-22 DIAGNOSIS — R293 Abnormal posture: Secondary | ICD-10-CM | POA: Insufficient documentation

## 2019-09-22 NOTE — Patient Instructions (Signed)
Access Code: [email or web address]: [email or web address]: 08/03/2021Prepared by: Casimiro Needle SherkExercises  Seated Cervical Retraction - 1 x daily - 7 x weekly - 1 sets - 3 reps  Seated Scapular Retraction - 1 x daily - 7 x weekly - 1 sets - 20 reps - 3 seconds hold  Seated Gentle Upper Trapezius Stretch - 2 x daily - 7 x weekly - 1 sets - 3 reps - 20 seconds hold  Gentle Levator Scapulae Stretch - 2 x daily - 7 x weekly - 1 sets - 3 reps - 20 seconds hold
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
## 2019-09-24 ENCOUNTER — Ambulatory Visit: Payer: BC Managed Care – PPO | Admitting: Physical Therapy

## 2019-09-24 ENCOUNTER — Encounter: Payer: Self-pay | Admitting: Physical Therapy

## 2019-09-24 ENCOUNTER — Other Ambulatory Visit: Payer: Self-pay

## 2019-09-24 DIAGNOSIS — R29898 Other symptoms and signs involving the musculoskeletal system: Secondary | ICD-10-CM

## 2019-09-24 DIAGNOSIS — M542 Cervicalgia: Secondary | ICD-10-CM

## 2019-09-24 DIAGNOSIS — R293 Abnormal posture: Secondary | ICD-10-CM

## 2019-09-24 NOTE — Therapy (Signed)
[facility] [address] [place], [place], [address] Phone: [phone]   Fax:  [phone]  Physical Therapy Treatment  Patient Details  Name: Devon Macdonald. MRN: [id number] Date of Birth: 01/30/1981 Referring Provider (PT): Dr. Annell Greening   Encounter Date: 09/24/2019   PT End of Session - 09/24/19 1752    Visit Number 2    Number of Visits 9    Date for PT Re-Evaluation 10/20/19    Authorization - Visit Number 2    Authorization - Number of Visits 10    PT Start Time 1654    PT Stop Time 1747    PT Time Calculation (min) 53 min    Activity Tolerance Patient tolerated treatment well    Behavior During Therapy [facility] for tasks assessed/performed           Past Medical History:  Diagnosis Date  . Elevated LFTs   . Epididymitis   . Hypertension   . Obesity     Past Surgical History:  Procedure Laterality Date  . Broke right femur  1997  . broke right hand  2003  . lithrostripsy  2003    There were no vitals filed for this visit.   Subjective Assessment - 09/24/19 1750    Subjective Pt. reports headache 4/10 and neck pain. Pt states he's been feeling good otherwise.    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    Patient Stated Goals decrease pain, return to normal    Currently in Pain? Yes    Pain Score 4     Pain Location Head    Pain Orientation Left             Manual:   STM to bilat sub-occipitals, cervical paraspinals, and upper traps: pt very tender and tight on L side cervical paraspinals and suboccipitals. Pt reported 1-2/10 HA after treatment, began treatment with 4/10 HA.   There Ex:  Scap squeezes: 10x  Chin tucks: 10x   Reviewed HEP       PT Long Term Goals - 09/23/19 0937      PT LONG TERM GOAL #1   Title Pt. will improve FOTO to 73 to improve pain free functional mobility.    Baseline initial: 63    Time 4    Period Weeks    Status New     Target Date 10/20/19      PT LONG TERM GOAL #2   Title Pt. will be able to demonstrate pain free symmetrical bilateral cervical lateral flexion to improve pain free functional mobility.    Baseline initial: R cervical lateral flexion: 45 deg, L cervical lateral flexion: 50 deg;    Time 4    Period Weeks    Status New    Target Date 10/20/19      PT LONG TERM GOAL #3   Title Pt. will report no shoulder discomfort after completing overhead tasks for more than 5 mins to improve ability to complete ADLs and work related tasks.    Baseline initial: pt reports shoulder discomfort, not pain with overhead activities    Time 4    Period Weeks    Status New    Target Date 10/20/19      PT LONG TERM GOAL #4   Title Pt. will report no increase in neck pain above 3/10 after working in yard for more than 1 hour to improve pain free mobility.  Baseline initial: 6/10 pain    Time 4    Period Weeks    Status New    Target Date 10/20/19                 Plan - 09/24/19 1754    Clinical Impression Statement Pt responded well to manual therapy and traction today. Pt. reported HA 4/10 pain upon arrival to therapy, left with 1-2/10 HA. Pt. demonstrates good return on chin tucks and scap squeezes from HEP. Pt. educated on timeline of treatment and disease process. Pt. will continue to benefit from skilled PT to improve ROM and improve ability to complete ADLs/work related tasks.    Stability/Clinical Decision Making Evolving/Moderate complexity    Clinical Decision Making Moderate    Rehab Potential Good    PT Frequency 2x / week    PT Duration 4 weeks    PT Treatment/Interventions ADLs/Self Care Home Management;Cryotherapy;Traction;Functional mobility training;Therapeutic activities;Therapeutic exercise;Neuromuscular re-education;Manual techniques;Dry needling;Passive range of motion;Patient/family education    PT Next Visit Plan progress postural strengthening, show at home nerve glides     Consulted and Agree with Plan of Care Patient           Patient will benefit from skilled therapeutic intervention in order to improve the following deficits and impairments:  Decreased activity tolerance, Decreased mobility, Decreased range of motion, Postural dysfunction, Pain, Impaired flexibility, Hypomobility, Decreased strength, Increased fascial restricitons  Visit Diagnosis: Neck pain  Decreased ROM of neck  Abnormal posture     Problem List Patient Active Problem List   Diagnosis Date Noted  . Cervical strain, acute 08/25/2019  . Snoring 03/30/2019  . Stress at home 02/12/2019  . Allergy to honey bee venom 07/14/2018  . Obesity (BMI 30.0-34.9) 11/03/2016  . Allergic cough 06/23/2014  . Muscle spasms of neck 06/23/2014  . Migraine headache 07/04/2011  . Abnormal TSH 07/04/2011  . Rhinitis, allergic 04/04/2011  . Hyperlipemia 06/13/2007  . Essential hypertension 06/13/2007   Cammie Mcgee, PT, DPT # [id number] Sharyn Creamer, SPT 09/25/2019, 1:42 PM  [facility] [facility] [address] [place], [place], [address] Phone: [phone]   Fax:  [phone]  Name: [name]. MRN: [id number] Date of Birth: [birth year]

## 2019-09-25 ENCOUNTER — Ambulatory Visit (INDEPENDENT_AMBULATORY_CARE_PROVIDER_SITE_OTHER): Payer: BC Managed Care – PPO | Admitting: Psychologist

## 2019-09-25 DIAGNOSIS — F430 Acute stress reaction: Secondary | ICD-10-CM

## 2019-09-29 ENCOUNTER — Other Ambulatory Visit: Payer: Self-pay

## 2019-09-29 ENCOUNTER — Encounter: Payer: Self-pay | Admitting: Physical Therapy

## 2019-09-29 ENCOUNTER — Ambulatory Visit: Payer: BC Managed Care – PPO | Admitting: Physical Therapy

## 2019-09-29 DIAGNOSIS — M542 Cervicalgia: Secondary | ICD-10-CM | POA: Diagnosis not present

## 2019-09-29 DIAGNOSIS — R29898 Other symptoms and signs involving the musculoskeletal system: Secondary | ICD-10-CM

## 2019-09-29 DIAGNOSIS — R293 Abnormal posture: Secondary | ICD-10-CM

## 2019-09-29 NOTE — Therapy (Signed)
[facility] [address] [place], [place], [address] Phone: [phone]   Fax:  [phone]  Physical Therapy Treatment  Patient Details  Name: Devon Macdonald. MRN: [id number] Date of Birth: 1981/01/21 Referring Provider (PT): Dr. Annell Greening   Encounter Date: 09/29/2019   PT End of Session - 09/29/19 1708    Visit Number 3    Number of Visits 9    Date for PT Re-Evaluation 10/20/19    Authorization - Visit Number 3    Authorization - Number of Visits 10    PT Start Time 1655    PT Stop Time 1740    PT Time Calculation (min) 45 min    Activity Tolerance Patient tolerated treatment well    Behavior During Therapy [facility] for tasks assessed/performed           Past Medical History:  Diagnosis Date  . Elevated LFTs   . Epididymitis   . Hypertension   . Obesity     Past Surgical History:  Procedure Laterality Date  . Broke right femur  1997  . broke right hand  2003  . lithrostripsy  2003    There were no vitals filed for this visit.   Subjective Assessment - 09/29/19 1707    Subjective Pt. reports no headache or neck pain today.    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    Patient Stated Goals decrease pain, return to normal    Currently in Pain? No/denies            There Ex:  Seated scap retractions 10x  Standing scap retractions against RTB: 15x  Standing shoulder ext against RTB: 15x  Seated thoracic ext against half bolster upper thoracic spine T7-T2: 10x each level  Seated thoracic rotation with flexion: 10x each direction. Discontinued due to not feeling helpful in affected area.   Seated upper trap stretches: 3x15s each side  Seated levator scap stretches: 3x15s each side  Standing thoracic extension against green theraball: 10x with 5s holds  Seated theragun on R upper trap with R cervical rotation: 3 mins          PT Long Term Goals -  09/23/19 6237      PT LONG TERM GOAL #1   Title Pt. will improve FOTO to 73 to improve pain free functional mobility.    Baseline initial: 63    Time 4    Period Weeks    Status New    Target Date 10/20/19      PT LONG TERM GOAL #2   Title Pt. will be able to demonstrate pain free symmetrical bilateral cervical lateral flexion to improve pain free functional mobility.    Baseline initial: R cervical lateral flexion: 45 deg, L cervical lateral flexion: 50 deg;    Time 4    Period Weeks    Status New    Target Date 10/20/19      PT LONG TERM GOAL #3   Title Pt. will report no shoulder discomfort after completing overhead tasks for more than 5 mins to improve ability to complete ADLs and work related tasks.    Baseline initial: pt reports shoulder discomfort, not pain with overhead activities    Time 4    Period Weeks    Status New    Target Date 10/20/19      PT LONG TERM GOAL #4   Title Pt. will report no increase  in neck pain above 3/10 after working in yard for more than 1 hour to improve pain free mobility.    Baseline initial: 6/10 pain    Time 4    Period Weeks    Status New    Target Date 10/20/19                 Plan - 09/29/19 1747    Clinical Impression Statement Pt responded well to seated and standing thoracic ext against half bolster and green theraball today. Pt adjusted head position many times with PT help to decrease radicular sx down R am, with proper neck positioning radicular sx were resolved. Pt. demonstrated good form and resolution of pain sx in neck with scapular strengthening exercises. Pt. will continue to benefit from skilled PT to improve ROM and improve pain free functional mobility.    Stability/Clinical Decision Making Evolving/Moderate complexity    Clinical Decision Making Moderate    Rehab Potential Good    PT Frequency 2x / week    PT Duration 4 weeks    PT Treatment/Interventions ADLs/Self Care Home  Management;Cryotherapy;Traction;Functional mobility training;Therapeutic activities;Therapeutic exercise;Neuromuscular re-education;Manual techniques;Dry needling;Passive range of motion;Patient/family education    PT Next Visit Plan progress postural strengthening, show at home nerve glides    Consulted and Agree with Plan of Care Patient           Patient will benefit from skilled therapeutic intervention in order to improve the following deficits and impairments:  Decreased activity tolerance, Decreased mobility, Decreased range of motion, Postural dysfunction, Pain, Impaired flexibility, Hypomobility, Decreased strength, Increased fascial restricitons  Visit Diagnosis: Neck pain  Decreased ROM of neck  Abnormal posture     Problem List Patient Active Problem List   Diagnosis Date Noted  . Cervical strain, acute 08/25/2019  . Snoring 03/30/2019  . Stress at home 02/12/2019  . Allergy to honey bee venom 07/14/2018  . Obesity (BMI 30.0-34.9) 11/03/2016  . Allergic cough 06/23/2014  . Muscle spasms of neck 06/23/2014  . Migraine headache 07/04/2011  . Abnormal TSH 07/04/2011  . Rhinitis, allergic 04/04/2011  . Hyperlipemia 06/13/2007  . Essential hypertension 06/13/2007   Cammie Mcgee, PT, DPT # [id number] Sharyn Creamer, SPT 09/30/2019, 6:30 PM  [facility] [address] [place], [place], [address] Phone: [phone]   Fax:  [phone]  Name: Devon Macdonald. MRN: [id number] Date of Birth: 09/11/1980

## 2019-10-01 ENCOUNTER — Ambulatory Visit: Payer: BC Managed Care – PPO | Admitting: Physical Therapy

## 2019-10-01 ENCOUNTER — Encounter: Payer: Self-pay | Admitting: Physical Therapy

## 2019-10-01 ENCOUNTER — Other Ambulatory Visit: Payer: Self-pay

## 2019-10-01 DIAGNOSIS — M542 Cervicalgia: Secondary | ICD-10-CM

## 2019-10-01 DIAGNOSIS — R29898 Other symptoms and signs involving the musculoskeletal system: Secondary | ICD-10-CM

## 2019-10-01 DIAGNOSIS — R293 Abnormal posture: Secondary | ICD-10-CM

## 2019-10-01 NOTE — Therapy (Signed)
[facility] [address] [place], [place], [address] Phone: [phone]   Fax:  [phone]  Physical Therapy Treatment  Patient Details  Name: Devon Macdonald. MRN: [id number] Date of Birth: March 28, 1980 Referring Provider (PT): Dr. Annell Greening   Encounter Date: 10/01/2019   PT End of Session - 10/01/19 1704    Visit Number 4    Number of Visits 9    Date for PT Re-Evaluation 10/20/19    Authorization - Visit Number 4    Authorization - Number of Visits 10    PT Start Time 1703    PT Stop Time 1753    PT Time Calculation (min) 50 min    Activity Tolerance Patient tolerated treatment well    Behavior During Therapy [facility] for tasks assessed/performed           Past Medical History:  Diagnosis Date  . Elevated LFTs   . Epididymitis   . Hypertension   . Obesity     Past Surgical History:  Procedure Laterality Date  . Broke right femur  1997  . broke right hand  2003  . lithrostripsy  2003    There were no vitals filed for this visit.   Subjective Assessment - 10/01/19 1703    Subjective Pt reports no headaches or neck pain today.    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    Patient Stated Goals decrease pain, return to normal    Currently in Pain? No/denies          There ex:  Supine chin tucks: 10x   Supine half bolster thoracic ext: 2x10 from T2-T6  Prone shoulder lift arms by sides: 10x   Prone supermans: 10x  I's, T's, Y's in prone: 10x each, pt cued to keep chin tucked  Isometric YTB shoulder ER with elevation on wall: 2x 20 each arm in different directions  Standing UE overhead wall slides with half bolster on T2-3: 10x   Standing T2-3 half bolster thoracic ext: 10x  Seated upper trap and levator scap stretches: 3x15 sec each side, each stretch  Rolling white exercise ball overhead in hallway: ~70 feet. No increase in pain or sx with this activity    Manual:   Cervical traction in supine: 5x30 sec        PT Long Term Goals - 09/23/19 0937      PT LONG TERM GOAL #1   Title Pt. will improve FOTO to 73 to improve pain free functional mobility.    Baseline initial: 63    Time 4    Period Weeks    Status New    Target Date 10/20/19      PT LONG TERM GOAL #2   Title Pt. will be able to demonstrate pain free symmetrical bilateral cervical lateral flexion to improve pain free functional mobility.    Baseline initial: R cervical lateral flexion: 45 deg, L cervical lateral flexion: 50 deg;    Time 4    Period Weeks    Status New    Target Date 10/20/19      PT LONG TERM GOAL #3   Title Pt. will report no shoulder discomfort after completing overhead tasks for more than 5 mins to improve ability to complete ADLs and work related tasks.    Baseline initial: pt reports shoulder discomfort, not pain with overhead activities    Time 4    Period Weeks  Status New    Target Date 10/20/19      PT LONG TERM GOAL #4   Title Pt. will report no increase in neck pain above 3/10 after working in yard for more than 1 hour to improve pain free mobility.    Baseline initial: 6/10 pain    Time 4    Period Weeks    Status New    Target Date 10/20/19                 Plan - 10/01/19 1745    Clinical Impression Statement Pt. responded well to postural strengthening exercises. Pt. experienced one incident of headache after positional change, resolved with 2 bouts of cervical traction in supine. Pt. had no increase in pain with overhead ball on the wall in the hallway. Pt. will continue to benefit from skilled PT to improve ROM and pain free functional mobility.    Stability/Clinical Decision Making Evolving/Moderate complexity    Clinical Decision Making Moderate    Rehab Potential Good    PT Frequency 2x / week    PT Duration 4 weeks    PT Treatment/Interventions ADLs/Self Care Home Management;Cryotherapy;Traction;Functional  mobility training;Therapeutic activities;Therapeutic exercise;Neuromuscular re-education;Manual techniques;Dry needling;Passive range of motion;Patient/family education    PT Next Visit Plan progress postural strengthening, show at home nerve glides    Consulted and Agree with Plan of Care Patient           Patient will benefit from skilled therapeutic intervention in order to improve the following deficits and impairments:  Decreased activity tolerance, Decreased mobility, Decreased range of motion, Postural dysfunction, Pain, Impaired flexibility, Hypomobility, Decreased strength, Increased fascial restricitons  Visit Diagnosis: Neck pain  Decreased ROM of neck  Abnormal posture     Problem List Patient Active Problem List   Diagnosis Date Noted  . Cervical strain, acute 08/25/2019  . Snoring 03/30/2019  . Stress at home 02/12/2019  . Allergy to honey bee venom 07/14/2018  . Obesity (BMI 30.0-34.9) 11/03/2016  . Allergic cough 06/23/2014  . Muscle spasms of neck 06/23/2014  . Migraine headache 07/04/2011  . Abnormal TSH 07/04/2011  . Rhinitis, allergic 04/04/2011  . Hyperlipemia 06/13/2007  . Essential hypertension 06/13/2007   Cammie Mcgee, PT, DPT # [id number] Sharyn Creamer, SPT 10/01/2019, 6:03 PM  [facility] [address] [place], [place], [address] Phone: [phone]   Fax:  [phone]  Name: Devon Macdonald. MRN: [id number] Date of Birth: 1980-08-17

## 2019-10-05 ENCOUNTER — Ambulatory Visit: Payer: BC Managed Care – PPO | Admitting: Family Medicine

## 2019-10-06 ENCOUNTER — Encounter: Payer: Self-pay | Admitting: Physical Therapy

## 2019-10-06 ENCOUNTER — Ambulatory Visit: Payer: BC Managed Care – PPO | Admitting: Physical Therapy

## 2019-10-06 ENCOUNTER — Encounter: Payer: Self-pay | Admitting: Family Medicine

## 2019-10-06 ENCOUNTER — Telehealth: Payer: BC Managed Care – PPO | Admitting: Family Medicine

## 2019-10-06 ENCOUNTER — Other Ambulatory Visit: Payer: Self-pay

## 2019-10-06 VITALS — BP 120/75 | HR 92 | Ht 65.0 in | Wt 185.0 lb

## 2019-10-06 DIAGNOSIS — Z91030 Bee allergy status: Secondary | ICD-10-CM

## 2019-10-06 DIAGNOSIS — G43119 Migraine with aura, intractable, without status migrainosus: Secondary | ICD-10-CM | POA: Diagnosis not present

## 2019-10-06 DIAGNOSIS — R0683 Snoring: Secondary | ICD-10-CM

## 2019-10-06 DIAGNOSIS — R29898 Other symptoms and signs involving the musculoskeletal system: Secondary | ICD-10-CM

## 2019-10-06 DIAGNOSIS — M62838 Other muscle spasm: Secondary | ICD-10-CM | POA: Diagnosis not present

## 2019-10-06 DIAGNOSIS — I10 Essential (primary) hypertension: Secondary | ICD-10-CM | POA: Diagnosis not present

## 2019-10-06 DIAGNOSIS — R293 Abnormal posture: Secondary | ICD-10-CM

## 2019-10-06 DIAGNOSIS — M542 Cervicalgia: Secondary | ICD-10-CM

## 2019-10-06 MED ORDER — EPINEPHRINE 0.3 MG/0.3ML IJ SOAJ: 0.3000 mg | INTRAMUSCULAR | 3 refills | Status: DC | PRN

## 2019-10-06 MED ORDER — TRIAMTERENE-HCTZ 75-50 MG PO TABS: 1.0000 | ORAL_TABLET | Freq: Every day | ORAL | 2 refills | Status: DC

## 2019-10-06 NOTE — Assessment & Plan Note (Signed)
Using CPAP now.  Reports he is taking some time to get used to but getting much better.

## 2019-10-06 NOTE — Progress Notes (Signed)
Virtual Visit via Telephone Note   This visit type was conducted due to national recommendations for restrictions regarding the COVID-19 Pandemic (e.g. social distancing) in an effort to limit this patient's exposure and mitigate transmission in our community.  Due to his co-morbid illnesses, this patient is at least at moderate risk for complications without adequate follow up.  This format is felt to be most appropriate for this patient at this time.  The patient did not have access to video technology/had technical difficulties with video requiring transitioning to audio format only (telephone).  All issues noted in this document were discussed and addressed.  No physical exam could be performed with this format.   Evaluation Performed:  Follow-up visit  Date:  10/06/2019   ID:  Devon Close., DOB 01-Jul-1980, MRN [id number]  Patient Location: Home Provider Location: Office/Clinic  Location of Patient: Home Location of Provider: Telehealth Consent was obtain for visit to be over via telehealth. I verified that I am speaking with the correct person using two identifiers.  PCP:  Kerri Perches, MD   Chief Complaint: Follow-up  History of Present Illness:    Devon Sidman. is a pleasant 39 y.o. male with history of allergic cough, hypertension, hyperlipidemia, muscle strain. Overall reports doing really well.  Has not had too many headaches but still is getting the headaches taking medication as directed.  Reports blood pressures doing well.  Is using his CPAP machine sleeping very well.  Needs refills on his blood pressure medicine and his EpiPen.  Is being followed closely with Ortho and doing rehab for his neck he reports some improvement there.  Overall he does not have any complaints or concerns today.  The patient does not have symptoms concerning for COVID-19 infection (fever, chills, cough, or new shortness of breath).   Past Medical, Surgical, Social  History, Allergies, and Medications have been Reviewed.  Past Medical History:  Diagnosis Date  . Allergic cough 06/23/2014  . Elevated LFTs   . Epididymitis   . Hypertension   . Obesity    Past Surgical History:  Procedure Laterality Date  . Broke right femur  1997  . broke right hand  2003  . lithrostripsy  2003     Current Meds  Medication Sig  . albuterol (VENTOLIN HFA) 108 (90 Base) MCG/ACT inhaler Inhale into the lungs.  . ALPRAZolam (XANAX) 0.25 MG tablet Take one tablet by mouth, at bedtime, as needed, for extreme anxiety  . EPINEPHrine 0.3 mg/0.3 mL IJ SOAJ injection Inject 0.3 mLs (0.3 mg total) into the muscle as needed for anaphylaxis.  . fluticasone (FLONASE) 50 MCG/ACT nasal spray Place into the nose.  . methocarbamol (ROBAXIN) 500 MG tablet Take 1 tablet (500 mg total) by mouth every 8 (eight) hours as needed for muscle spasms.  . montelukast (SINGULAIR) 10 MG tablet Take 1 tablet (10 mg total) by mouth at bedtime.  . Multiple Vitamin (MULTIVITAMIN WITH MINERALS) TABS Take 1 tablet by mouth daily.  . SUMAtriptan (IMITREX) 100 MG tablet TAKE 1 TAB AT ONSET OF HEADACHE. MAY REPEAT ONCE IN 2 HOURS IF HEADACHE PERSISTS. MAX 2 TIMES A WEEK  . topiramate (TOPAMAX) 100 MG tablet TAKE 1 TABLET BY MOUTH TWICE A DAY  . triamterene-hydrochlorothiazide (MAXZIDE) 75-50 MG tablet Take 1 tablet by mouth daily.  . [DISCONTINUED] EPINEPHrine 0.3 mg/0.3 mL IJ SOAJ injection Inject 0.3 mLs (0.3 mg total) into the muscle as needed for anaphylaxis.  . [DISCONTINUED]  triamterene-hydrochlorothiazide (MAXZIDE) 75-50 MG tablet Take 1 tablet by mouth daily.     Allergies:   Bee venom and Doxycycline   ROS:   Please see the history of present illness.    All other systems reviewed and are negative.   Labs/Other Tests and Data Reviewed:    Recent Labs: No results found for requested labs within last 8760 hours.   Recent Lipid Panel Lab Results  Component Value Date/Time   CHOL 208  (H) 08/09/2017 12:00 AM   TRIG 129 08/09/2017 12:00 AM   HDL 48 08/09/2017 12:00 AM   CHOLHDL 4.3 08/09/2017 12:00 AM   LDLCALC 135 (H) 08/09/2017 12:00 AM    Wt Readings from Last 3 Encounters:  10/06/19 185 lb (83.9 kg)  08/25/19 195 lb (88.5 kg)  06/10/19 192 lb 6.4 oz (87.3 kg)     Objective:    Vital Signs:  BP 120/75   Pulse 92   Ht 5\' 5"  (1.651 m)   Wt 185 lb (83.9 kg)   BMI 30.79 kg/m    VITAL SIGNS:  reviewed GEN:  Alert and oriented RESPIRATORY:  no shortness of breath conversation PSYCH:  normal affect and mood  ASSESSMENT & PLAN:     1. Essential hypertension  - triamterene-hydrochlorothiazide (MAXZIDE) 75-50 MG tablet; Take 1 tablet by mouth daily.  Dispense: 90 tablet; Refill: 2  2. Allergy to honey bee venom - EPINEPHrine 0.3 mg/0.3 mL IJ SOAJ injection; Inject 0.3 mLs (0.3 mg total) into the muscle as needed for anaphylaxis.  Dispense: 1 each; Refill: 3  3. 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 Muscle spasms of neck   5. Snoring   Time:   Today, I have spent 10 minutes with the patient with telehealth technology discussing the above problems.     Medication Adjustments/Labs and Tests Ordered: Current medicines are reviewed at length with the patient today.  Concerns regarding medicines are outlined above.   Tests Ordered: No orders of the defined types were placed in this encounter.   Medication Changes: Meds ordered this encounter  Medications  . EPINEPHrine 0.3 mg/0.3 mL IJ SOAJ injection    Sig: Inject 0.3 mLs (0.3 mg total) into the muscle as needed for anaphylaxis.    Dispense:  1 each    Refill:  3    Order Specific Question:   Supervising Provider    Answer:   SIMPSON, MARGARET E [[id number]]  . triamterene-hydrochlorothiazide (MAXZIDE) 75-50 MG tablet    Sig: Take 1 tablet by mouth daily.    Dispense:  90 tablet    Refill:  2    Order Specific Question:   Supervising Provider    Answer:   E [[id number]]    Disposition:  Follow up 6 months Signed, [name], NP  10/06/2019 4:18 PM     10/08/2019 [facility]

## 2019-10-06 NOTE — Assessment & Plan Note (Signed)
In physical therapy.  Continue therapy at this time.  Overall reports doing better

## 2019-10-06 NOTE — Assessment & Plan Note (Signed)
Controlled, no medication change refill provided

## 2019-10-06 NOTE — Patient Instructions (Signed)
I appreciate the opportunity to provide you with care for your health and wellness. Today we discussed: overall health  Follow up: 6 months in office with Dr Lodema Hong  No labs or referrals today  Glad all is going well. We are happy to hear this. Please take care and stay safe.  Call if you need anything before your next appt.  Please continue to practice social distancing to keep you, your family, and our community safe.  If you must go out, please wear a mask and practice good handwashing.  It was a pleasure to see you and I look forward to continuing to work together on your health and well-being. Please do not hesitate to call the office if you need care or have questions about your care.  Have a wonderful day and week. With Gratitude, Tereasa Coop, DNP, AGNP-BC

## 2019-10-06 NOTE — Therapy (Signed)
[facility] [address] [place], [place], [address] Phone: [phone]   Fax:  [phone]  Physical Therapy Treatment  Patient Details  Name: Devon Macdonald. MRN: [id number] Date of Birth: 13-Oct-1980 Referring Provider (PT): Dr. Annell Greening   Encounter Date: 10/06/2019   PT End of Session - 10/06/19 1656    Visit Number 5    Number of Visits 9    Date for PT Re-Evaluation 10/20/19    Authorization - Visit Number 5    Authorization - Number of Visits 10    PT Start Time 1655    PT Stop Time 1737    PT Time Calculation (min) 42 min    Activity Tolerance Patient tolerated treatment well    Behavior During Therapy [facility] for tasks assessed/performed           Past Medical History:  Diagnosis Date  . Allergic cough 06/23/2014  . Elevated LFTs   . Epididymitis   . Hypertension   . Obesity     Past Surgical History:  Procedure Laterality Date  . Broke right femur  1997  . broke right hand  2003  . lithrostripsy  2003    There were no vitals filed for this visit.   Subjective Assessment - 10/06/19 1655    Subjective Pt. reports having headache over the weekend, likely from the weather.  Pt. reports no pain currently and no HA.    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    Patient Stated Goals decrease pain, return to normal    Currently in Pain? No/denies            There Ex:  Prone I's, T's, Y's: 10x each  Standing half bolster thoracic ext: 5 mins  Standing leaning over green theraball thoracic stretch: 10x  TRX: I's, T's, Y's: 10x. I's cause some pain in lower L shoulder blade, able to improve pain with depressing scapula before starting.   Wall angels: 10x  Prone press up: 5x. Pt able to achieve prone press up on hands for 2 reps. No increase in pain or n/t throughout.   Nautilus:   Scap retraction: 30lb double arm: 10x  Straight arm pull down: 20lb double  arm: 10x      PT Long Term Goals - 09/23/19 0937      PT LONG TERM GOAL #1   Title Pt. will improve FOTO to 73 to improve pain free functional mobility.    Baseline initial: 63    Time 4    Period Weeks    Status New    Target Date 10/20/19      PT LONG TERM GOAL #2   Title Pt. will be able to demonstrate pain free symmetrical bilateral cervical lateral flexion to improve pain free functional mobility.    Baseline initial: R cervical lateral flexion: 45 deg, L cervical lateral flexion: 50 deg;    Time 4    Period Weeks    Status New    Target Date 10/20/19      PT LONG TERM GOAL #3   Title Pt. will report no shoulder discomfort after completing overhead tasks for more than 5 mins to improve ability to complete ADLs and work related tasks.    Baseline initial: pt reports shoulder discomfort, not pain with overhead activities    Time 4    Period Weeks    Status New    Target Date 10/20/19  PT LONG TERM GOAL #4   Title Pt. will report no increase in neck pain above 3/10 after working in yard for more than 1 hour to improve pain free mobility.    Baseline initial: 6/10 pain    Time 4    Period Weeks    Status New    Target Date 10/20/19              Plan - 10/06/19 1745    Clinical Impression Statement Pt. continues to respond well to postural strengthening exercises. Pt. demonstrated improved thoracic mobility today with no increase in pain or radicular sx. Pt. did not experience any HA after position changes today. Pt. will continue to benefit from skilled PT to improve ROM and pain free functional mobility.    Stability/Clinical Decision Making Evolving/Moderate complexity    Clinical Decision Making Moderate    Rehab Potential Good    PT Frequency 2x / week    PT Duration 4 weeks    PT Treatment/Interventions ADLs/Self Care Home Management;Cryotherapy;Traction;Functional mobility training;Therapeutic activities;Therapeutic exercise;Neuromuscular  re-education;Manual techniques;Dry needling;Passive range of motion;Patient/family education    PT Next Visit Plan progress postural strengthening, show at home nerve glides    Consulted and Agree with Plan of Care Patient           Patient will benefit from skilled therapeutic intervention in order to improve the following deficits and impairments:  Decreased activity tolerance, Decreased mobility, Decreased range of motion, Postural dysfunction, Pain, Impaired flexibility, Hypomobility, Decreased strength, Increased fascial restricitons  Visit Diagnosis: Neck pain  Decreased ROM of neck  Abnormal posture     Problem List Patient Active Problem List   Diagnosis Date Noted  . Cervical strain, acute 08/25/2019  . Snoring 03/30/2019  . Stress at home 02/12/2019  . Allergy to honey bee venom 07/14/2018  . Obesity (BMI 30.0-34.9) 11/03/2016  . Muscle spasms of neck 06/23/2014  . Migraine headache 07/04/2011  . Abnormal TSH 07/04/2011  . Hyperlipemia 06/13/2007  . Essential hypertension 06/13/2007   Cammie Mcgee, PT, DPT # [id number] Sharyn Creamer, SPT 10/07/2019, 9:48 AM  [facility] [address] [place], [place], [address] Phone: [phone]   Fax:  [phone]  Name: Devon Macdonald. MRN: [id number] Date of Birth: 08-15-1980

## 2019-10-06 NOTE — Assessment & Plan Note (Signed)
Refills provided, has not had to use.

## 2019-10-08 ENCOUNTER — Encounter: Payer: Self-pay | Admitting: Physical Therapy

## 2019-10-08 ENCOUNTER — Other Ambulatory Visit: Payer: Self-pay

## 2019-10-08 ENCOUNTER — Ambulatory Visit: Payer: BC Managed Care – PPO | Admitting: Physical Therapy

## 2019-10-08 DIAGNOSIS — M542 Cervicalgia: Secondary | ICD-10-CM | POA: Diagnosis not present

## 2019-10-08 DIAGNOSIS — R29898 Other symptoms and signs involving the musculoskeletal system: Secondary | ICD-10-CM

## 2019-10-08 DIAGNOSIS — R293 Abnormal posture: Secondary | ICD-10-CM

## 2019-10-08 NOTE — Therapy (Signed)
[facility] [address] [place], [place], [address] Phone: [phone]   Fax:  [phone]  Physical Therapy Treatment  Patient Details  Name: Devon Macdonald. MRN: [id number] Date of Birth: [birth year] Referring Provider (PT): Dr. Annell Greening   Encounter Date: 10/08/2019   PT End of Session - 10/08/19 1706    Visit Number 6    Number of Visits 9    Date for PT Re-Evaluation 10/20/19    Authorization - Visit Number 6    Authorization - Number of Visits 10    PT Start Time 1700    PT Stop Time 1749    PT Time Calculation (min) 49 min    Activity Tolerance Patient tolerated treatment well    Behavior During Therapy [facility] for tasks assessed/performed           Past Medical History:  Diagnosis Date  . Allergic cough 06/23/2014  . Elevated LFTs   . Epididymitis   . Hypertension   . Obesity     Past Surgical History:  Procedure Laterality Date  . Broke right femur  1997  . broke right hand  2003  . lithrostripsy  2003    There were no vitals filed for this visit.   Subjective Assessment - 10/08/19 1703    Subjective Pt. reports having no increased pain or sx. Pt. reports soreness in rhomboid area yesterday.    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    Patient Stated Goals decrease pain, return to normal    Currently in Pain? No/denies            There Ex:  UBE 5 mins forward, 5 mins backward  TRX: I's, T's, Y's: 10x. I's cause some pain in lower L shoulder blade, able to improve pain with depressing scapula before starting.   Supine laying on half bolster along entire spine: 2 mins, no increased sx  Hookyling open book: pt experiences more tightness rotating to the right compared to left  Quadruped thoracic reach under to above: 10x each side  Prone I's, T's, Y's: 10x each, pt better able to maintain cervical extension throughout   Seated upper trap and levator  scap stretches: 3x30s each           PT Long Term Goals - 09/23/19 0937      PT LONG TERM GOAL #1   Title Pt. will improve FOTO to 73 to improve pain free functional mobility.    Baseline initial: 63    Time 4    Period Weeks    Status New    Target Date 10/20/19      PT LONG TERM GOAL #2   Title Pt. will be able to demonstrate pain free symmetrical bilateral cervical lateral flexion to improve pain free functional mobility.    Baseline initial: R cervical lateral flexion: 45 deg, L cervical lateral flexion: 50 deg;    Time 4    Period Weeks    Status New    Target Date 10/20/19      PT LONG TERM GOAL #3   Title Pt. will report no shoulder discomfort after completing overhead tasks for more than 5 mins to improve ability to complete ADLs and work related tasks.    Baseline initial: pt reports shoulder discomfort, not pain with overhead activities    Time 4    Period Weeks    Status New    Target Date  10/20/19      PT LONG TERM GOAL #4   Title Pt. will report no increase in neck pain above 3/10 after working in yard for more than 1 hour to improve pain free mobility.    Baseline initial: 6/10 pain    Time 4    Period Weeks    Status New    Target Date 10/20/19                 Plan - 10/08/19 1742    Clinical Impression Statement Pt. able to complete more intense TRX exercises and prone thoracic ext and rotation exercises. Pt. continues to improve cervical lateral flexion and thoracic rotation. Pt. did not experience any increase in pain, radicular sx, or HA during/after tx. Pt. will continue to benefit from skilled PT to improve pain free ROM and functional mobility.    Stability/Clinical Decision Making Evolving/Moderate complexity    Clinical Decision Making Moderate    Rehab Potential Good    PT Frequency 2x / week    PT Duration 4 weeks    PT Treatment/Interventions ADLs/Self Care Home Management;Cryotherapy;Traction;Functional mobility  training;Therapeutic activities;Therapeutic exercise;Neuromuscular re-education;Manual techniques;Dry needling;Passive range of motion;Patient/family education    PT Next Visit Plan progress postural strengthening, show at home nerve glides    Consulted and Agree with Plan of Care Patient           Patient will benefit from skilled therapeutic intervention in order to improve the following deficits and impairments:  Decreased activity tolerance, Decreased mobility, Decreased range of motion, Postural dysfunction, Pain, Impaired flexibility, Hypomobility, Decreased strength, Increased fascial restricitons  Visit Diagnosis: Neck pain  Decreased ROM of neck  Abnormal posture     Problem List Patient Active Problem List   Diagnosis Date Noted  . Cervical strain, acute 08/25/2019  . Snoring 03/30/2019  . Stress at home 02/12/2019  . Allergy to honey bee venom 07/14/2018  . Obesity (BMI 30.0-34.9) 11/03/2016  . Muscle spasms of neck 06/23/2014  . Migraine headache 07/04/2011  . Abnormal TSH 07/04/2011  . Hyperlipemia 06/13/2007  . Essential hypertension 06/13/2007   Cammie Mcgee, PT, DPT # [id number] Sharyn Creamer, SPT 10/09/2019, 2:36 PM  [facility] [address] [place], [place], [address] Phone: [phone]   Fax:  [phone]  Name: Devon Macdonald. MRN: [id number] Date of Birth: 05-19-1980

## 2019-10-09 ENCOUNTER — Ambulatory Visit (INDEPENDENT_AMBULATORY_CARE_PROVIDER_SITE_OTHER): Payer: BC Managed Care – PPO | Admitting: Psychologist

## 2019-10-09 DIAGNOSIS — F430 Acute stress reaction: Secondary | ICD-10-CM

## 2019-10-13 ENCOUNTER — Ambulatory Visit: Payer: BC Managed Care – PPO | Admitting: Physical Therapy

## 2019-10-13 ENCOUNTER — Other Ambulatory Visit: Payer: Self-pay

## 2019-10-13 ENCOUNTER — Encounter: Payer: Self-pay | Admitting: Physical Therapy

## 2019-10-13 DIAGNOSIS — R29898 Other symptoms and signs involving the musculoskeletal system: Secondary | ICD-10-CM

## 2019-10-13 DIAGNOSIS — M542 Cervicalgia: Secondary | ICD-10-CM

## 2019-10-13 DIAGNOSIS — R293 Abnormal posture: Secondary | ICD-10-CM

## 2019-10-13 NOTE — Therapy (Signed)
[facility] [address] [place], [place], [address] Phone: [phone]   Fax:  [phone]  Physical Therapy Treatment  Patient Details  Name: Devon Macdonald. MRN: [id number] Date of Birth: 1981/02/16 Referring Provider (PT): Dr. Annell Greening   Encounter Date: 10/13/2019   PT End of Session - 10/13/19 1650    Visit Number 7    Number of Visits 9    Date for PT Re-Evaluation 10/20/19    Authorization - Visit Number 7    Authorization - Number of Visits 10    PT Start Time 1646    PT Stop Time 1728    PT Time Calculation (min) 42 min    Activity Tolerance Patient tolerated treatment well    Behavior During Therapy [facility] for tasks assessed/performed           Past Medical History:  Diagnosis Date  . Allergic cough 06/23/2014  . Elevated LFTs   . Epididymitis   . Hypertension   . Obesity     Past Surgical History:  Procedure Laterality Date  . Broke right femur  1997  . broke right hand  2003  . lithrostripsy  2003    There were no vitals filed for this visit.   Subjective Assessment - 10/13/19 1648    Subjective Pt reports no pain with completing yardwork over the weekend, however did take many breaks.    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    Patient Stated Goals decrease pain, return to normal    Currently in Pain? No/denies           There Ex:  UBE 10 mins: 5 mins forward, 5 mins backward  Nautilus:  Horizontal add: 40lbs double arm. 12x  Triceps ext: 40lbs double arm. 12x  Straight arm pull down: 40lbs double arm. 12x   Chest press: 50lbs double arm. 12x  Scap retraction: 40lbs double arm. 12x  Supine laying on half bolster along entire spine: 2 mins, increased sx at T6 level.   Standing ball on wall thoracic ext: 5 mins  Childs pose into cobra pose: 10x  Quadruped thoracic reach under: 10x each side       PT Long Term Goals - 09/23/19 0937       PT LONG TERM GOAL #1   Title Pt. will improve FOTO to 73 to improve pain free functional mobility.    Baseline initial: 63    Time 4    Period Weeks    Status New    Target Date 10/20/19      PT LONG TERM GOAL #2   Title Pt. will be able to demonstrate pain free symmetrical bilateral cervical lateral flexion to improve pain free functional mobility.    Baseline initial: R cervical lateral flexion: 45 deg, L cervical lateral flexion: 50 deg;    Time 4    Period Weeks    Status New    Target Date 10/20/19      PT LONG TERM GOAL #3   Title Pt. will report no shoulder discomfort after completing overhead tasks for more than 5 mins to improve ability to complete ADLs and work related tasks.    Baseline initial: pt reports shoulder discomfort, not pain with overhead activities    Time 4    Period Weeks    Status New    Target Date 10/20/19      PT LONG TERM GOAL #4  Title Pt. will report no increase in neck pain above 3/10 after working in yard for more than 1 hour to improve pain free mobility.    Baseline initial: 6/10 pain    Time 4    Period Weeks    Status New    Target Date 10/20/19               Plan - 10/13/19 1715    Clinical Impression Statement Pt. responded well to overall strengthening exercises and stretching thoracic stretches. Pt. experienced only one exacerbation of symptoms at about T6 level when laying on half bolster. Sx relieved when completing thoracic ext standing on green theraball. Pt. will continue to benefit from skilled PT to improve pain free functional mobility.    Stability/Clinical Decision Making Evolving/Moderate complexity    Clinical Decision Making Moderate    Rehab Potential Good    PT Frequency 2x / week    PT Duration 4 weeks    PT Treatment/Interventions ADLs/Self Care Home Management;Cryotherapy;Traction;Functional mobility training;Therapeutic activities;Therapeutic exercise;Neuromuscular re-education;Manual techniques;Dry  needling;Passive range of motion;Patient/family education    PT Next Visit Plan progress postural strengthening, show at home nerve glides    Consulted and Agree with Plan of Care Patient           Patient will benefit from skilled therapeutic intervention in order to improve the following deficits and impairments:  Decreased activity tolerance, Decreased mobility, Decreased range of motion, Postural dysfunction, Pain, Impaired flexibility, Hypomobility, Decreased strength, Increased fascial restricitons  Visit Diagnosis: Neck pain  Decreased ROM of neck  Abnormal posture     Problem List Patient Active Problem List   Diagnosis Date Noted  . Cervical strain, acute 08/25/2019  . Snoring 03/30/2019  . Stress at home 02/12/2019  . Allergy to honey bee venom 07/14/2018  . Obesity (BMI 30.0-34.9) 11/03/2016  . Muscle spasms of neck 06/23/2014  . Migraine headache 07/04/2011  . Abnormal TSH 07/04/2011  . Hyperlipemia 06/13/2007  . Essential hypertension 06/13/2007   Cammie Mcgee, PT, DPT # [id number] Sharyn Creamer, SPT 10/14/2019, 8:44 AM  [facility] [address] [place], [place], [address] Phone: [phone]   Fax:  [phone]  Name: [name]. MRN: [id number] Date of Birth: 07/22/1980

## 2019-10-15 ENCOUNTER — Other Ambulatory Visit: Payer: Self-pay

## 2019-10-15 ENCOUNTER — Telehealth: Payer: Self-pay | Admitting: Internal Medicine

## 2019-10-15 ENCOUNTER — Encounter: Payer: Self-pay | Admitting: Physical Therapy

## 2019-10-15 ENCOUNTER — Ambulatory Visit: Payer: BC Managed Care – PPO | Admitting: Physical Therapy

## 2019-10-15 DIAGNOSIS — M542 Cervicalgia: Secondary | ICD-10-CM | POA: Diagnosis not present

## 2019-10-15 DIAGNOSIS — R29898 Other symptoms and signs involving the musculoskeletal system: Secondary | ICD-10-CM

## 2019-10-15 DIAGNOSIS — R293 Abnormal posture: Secondary | ICD-10-CM

## 2019-10-15 NOTE — Telephone Encounter (Signed)
Atc patient unable to reach left detailed voicemail stating that we did change his visit on Tuesday to a virtual one through his mychart, he will need to log in and click on the link.   He is in [place] 30/30 days compliance

## 2019-10-15 NOTE — Telephone Encounter (Signed)
Works for me is CPAP use is stable with no issues. Can we ensure pt compliance report is in [place]?  Elisha Headland FNP
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
## 2019-10-15 NOTE — Therapy (Signed)
[facility] [address] [place], [place], [address] Phone: [phone]   Fax:  [phone]  Physical Therapy Treatment  Patient Details  Name: Devon Macdonald. MRN: [id number] Date of Birth: [birth year] Referring Provider (PT): Dr. Annell Greening   Encounter Date: 10/15/2019   PT End of Session - 10/15/19 1701    Visit Number 8    Number of Visits 9    Date for PT Re-Evaluation 10/20/19    Authorization - Visit Number 8    Authorization - Number of Visits 10    PT Start Time 1656    PT Stop Time 1744    PT Time Calculation (min) 48 min    Activity Tolerance Patient tolerated treatment well    Behavior During Therapy [facility] for tasks assessed/performed           Past Medical History:  Diagnosis Date  . Allergic cough 06/23/2014  . Elevated LFTs   . Epididymitis   . Hypertension   . Obesity     Past Surgical History:  Procedure Laterality Date  . Broke right femur  1997  . broke right hand  2003  . lithrostripsy  2003    There were no vitals filed for this visit.   Subjective Assessment - 10/15/19 1656    Subjective Pt. states that his upper thoracic spine has been hurting a bit 3/10 pain. He thinks from weather.    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    Patient Stated Goals decrease pain, return to normal    Currently in Pain? Yes    Pain Score 3     Pain Location Back    Pain Orientation Mid           There Ex:  Seated scap retractions: 10x  Seated chin tucks: 10x  Seated levator scap stretches: 3x30 sec each  Supine on half bolster with ice pack on top: Pt states this makes his back feel better.   - 5lbs chest press: 12x  - 5lbs horizontal abd   Overhead medicine ball toss and chest press toss to trampoline: 10x each  Ball on wall thoracic extension: 10x  Childs pose arms on green theraball: 10x  Pt chest on green theraball on knees, I's, T's, Y's: 2lb  weights bilat: 10x each  Body blade: both hands blade vertical, blade horizontal, then to single hands blade horizontal. 5 mins  TRX:   -I's, T's, and Y's. 10x each. Pt cued for maintaining upright body posture.  -TRX pushups: 10x        PT Long Term Goals - 09/23/19 0937      PT LONG TERM GOAL #1   Title Pt. will improve FOTO to 73 to improve pain free functional mobility.    Baseline initial: 63    Time 4    Period Weeks    Status New    Target Date 10/20/19      PT LONG TERM GOAL #2   Title Pt. will be able to demonstrate pain free symmetrical bilateral cervical lateral flexion to improve pain free functional mobility.    Baseline initial: R cervical lateral flexion: 45 deg, L cervical lateral flexion: 50 deg;    Time 4    Period Weeks    Status New    Target Date 10/20/19      PT LONG TERM GOAL #3   Title Pt. will report no shoulder discomfort after completing  overhead tasks for more than 5 mins to improve ability to complete ADLs and work related tasks.    Baseline initial: pt reports shoulder discomfort, not pain with overhead activities    Time 4    Period Weeks    Status New    Target Date 10/20/19      PT LONG TERM GOAL #4   Title Pt. will report no increase in neck pain above 3/10 after working in yard for more than 1 hour to improve pain free mobility.    Baseline initial: 6/10 pain    Time 4    Period Weeks    Status New    Target Date 10/20/19                 Plan - 10/15/19 1728    Clinical Impression Statement Pt. responded well to supine laying on half bolster with ice pack to reduce pain in upper thoracic pain. Pt able to complete entire session with periscapular strengthening with no increase in pain. Pt. will see doctor next Tuesday to check progress. Pt. will continue to benefit from skilled PT to improve pain free functional mobility.    Stability/Clinical Decision Making Evolving/Moderate complexity    Clinical Decision Making Moderate     Rehab Potential Good    PT Frequency 2x / week    PT Duration 4 weeks    PT Treatment/Interventions ADLs/Self Care Home Management;Cryotherapy;Traction;Functional mobility training;Therapeutic activities;Therapeutic exercise;Neuromuscular re-education;Manual techniques;Dry needling;Passive range of motion;Patient/family education    PT Next Visit Plan progress postural strengthening, show at home nerve glides    Consulted and Agree with Plan of Care Patient           Patient will benefit from skilled therapeutic intervention in order to improve the following deficits and impairments:  Decreased activity tolerance, Decreased mobility, Decreased range of motion, Postural dysfunction, Pain, Impaired flexibility, Hypomobility, Decreased strength, Increased fascial restricitons  Visit Diagnosis: Neck pain  Decreased ROM of neck  Abnormal posture     Problem List Patient Active Problem List   Diagnosis Date Noted  . Cervical strain, acute 08/25/2019  . Snoring 03/30/2019  . Stress at home 02/12/2019  . Allergy to honey bee venom 07/14/2018  . Obesity (BMI 30.0-34.9) 11/03/2016  . Muscle spasms of neck 06/23/2014  . Migraine headache 07/04/2011  . Abnormal TSH 07/04/2011  . Hyperlipemia 06/13/2007  . Essential hypertension 06/13/2007   Cammie Mcgee, PT, DPT # [id number] Sharyn Creamer, SPT 10/15/2019, 6:37 PM  [facility] [address] [place], [place], [address] Phone: [phone]   Fax:  [phone]  Name: Devon Macdonald. MRN: [id number] Date of Birth: 09/01/1980

## 2019-10-20 ENCOUNTER — Encounter: Payer: Self-pay | Admitting: Pulmonary Disease

## 2019-10-20 ENCOUNTER — Ambulatory Visit (INDEPENDENT_AMBULATORY_CARE_PROVIDER_SITE_OTHER): Payer: BC Managed Care – PPO | Admitting: Orthopaedic Surgery

## 2019-10-20 ENCOUNTER — Encounter: Payer: Self-pay | Admitting: Orthopaedic Surgery

## 2019-10-20 ENCOUNTER — Telehealth (INDEPENDENT_AMBULATORY_CARE_PROVIDER_SITE_OTHER): Payer: BC Managed Care – PPO | Admitting: Pulmonary Disease

## 2019-10-20 VITALS — Ht 65.0 in | Wt 185.0 lb

## 2019-10-20 DIAGNOSIS — G4733 Obstructive sleep apnea (adult) (pediatric): Secondary | ICD-10-CM

## 2019-10-20 DIAGNOSIS — S161XXS Strain of muscle, fascia and tendon at neck level, sequela: Secondary | ICD-10-CM | POA: Diagnosis not present

## 2019-10-20 NOTE — Assessment & Plan Note (Signed)
Plan: Continue CPAP therapy Order to DME company for mask of choice Encourage patient to continue to work on improving overall physical mobility and reducing BMI Follow-up in 1 year or sooner if patient starts having difficulty with using CPAP

## 2019-10-20 NOTE — Progress Notes (Signed)
Virtual Visit via Video Note  I connected with Devon Macdonald. on 10/20/19 at  3:00 PM EDT by a video enabled telemedicine application and verified that I am speaking with the correct person using two identifiers.  Location: Patient: Home Provider: Office - [facility] - [address] [place], [address], [place], [place] [address]  I discussed the limitations of evaluation and management by telemedicine and the availability of in person appointments. The patient expressed understanding and agreed to proceed. I also discussed with the patient that there may be a patient responsible charge related to this service. The patient expressed understanding and agreed to proceed.  Patient consented to consult via telephone: Yes People present and their role in pt care: Pt   History of Present Illness:  39 year old male never smoker followed in our office for obstructive sleep apnea  Past medical history: Hyperlipidemia, hypertension, abnormal TSH, obesity Smoking history: Never smoker Maintenance: None Patient of Dr. Maple Hudson  Chief complaint: Recent CPAP start  39 year old male never smoker followed in our office for mild obstructive sleep apnea. He is followed by Dr. Maple Hudson. Recently started CPAP therapy. He reports that is going very well. He feels that he is sleeping "a whole lot better". CPAP compliance report shows excellent compliance. See compliance were listed below:  09/20/2019-10/19/2019-30 on the last 30 days used, 30 those days greater than 4 hours, average usage 7 hours and 38 minutes, APAP setting 5-15, 95th percentile 12, AHI 0.9  Patient reports that overall things are going well. He does currently use a full facemask. He is interested in trying a nasal mask. He would like an order for this today.  Observations/Objective:  07/23/2019-home sleep study-AHI 9.1, SaO2 low 80%  Social History   Tobacco Use  Smoking Status Never Smoker  Smokeless Tobacco Never Used   Immunization  History  Administered Date(s) Administered  . Influenza Split 12/05/2011  . Influenza Whole 12/25/2004, 12/01/2009  . Influenza,inj,Quad PF,6+ Mos 02/20/2013, 12/17/2013, 12/28/2014, 10/20/2015, 10/31/2016, 11/19/2018  . Moderna SARS-COVID-2 Vaccination 04/14/2019, 05/08/2019  . Td 06/03/2007  . Tdap 07/08/2015    Assessment and Plan:  OSA (obstructive sleep apnea) Plan: Continue CPAP therapy Order to DME company for mask of choice Encourage patient to continue to work on improving overall physical mobility and reducing BMI Follow-up in 1 year or sooner if patient starts having difficulty with using CPAP   Follow Up Instructions:  Return in about 1 year (around 10/19/2020), or if symptoms worsen or fail to improve, for Follow up with Dr. Maple Hudson.    I discussed the assessment and treatment plan with the patient. The patient was provided an opportunity to ask questions and all were answered. The patient agreed with the plan and demonstrated an understanding of the instructions.   The patient was advised to call back or seek an in-person evaluation if the symptoms worsen or if the condition fails to improve as anticipated.  I provided 22 minutes of non-face-to-face time during this encounter.   Coral Ceo, NP
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
## 2019-10-20 NOTE — Patient Instructions (Addendum)
You were seen today by Coral Ceo, NP  for:   1. OSA (obstructive sleep apnea)  Order to DME company for mask of choice  We recommend that you continue using your CPAP daily >>>Keep up the hard work using your device >>> Goal should be wearing this for the entire night that you are sleeping, at least 4 to 6 hours  Remember:  . Do not drive or operate heavy machinery if tired or drowsy.  . Please notify the supply company and office if you are unable to use your device regularly due to missing supplies or machine being broken.  . Work on maintaining a healthy weight and following your recommended nutrition plan  . Maintain proper daily exercise and movement  . Maintaining proper use of your device can also help improve management of other chronic illnesses such as: Blood pressure, blood sugars, and weight management.   BiPAP/ CPAP Cleaning:  >>>Clean weekly, with Dawn soap, and bottle brush.  Set up to air dry. >>> Wipe mask out daily with wet wipe or towelette   Follow Up:    Return in about 1 year (around 10/19/2020), or if symptoms worsen or fail to improve, for Follow up with Dr. Maple Hudson.   Please do your part to reduce the spread of COVID-19:      Reduce your risk of any infection  and COVID19 by using the similar precautions used for avoiding the common cold or flu:  Marland Kitchen Wash your hands often with soap and warm water for at least 20 seconds.  If soap and water are not readily available, use an alcohol-based hand sanitizer with at least 60% alcohol.  . If coughing or sneezing, cover your mouth and nose by coughing or sneezing into the elbow areas of your shirt or coat, into a tissue or into your sleeve (not your hands). Drinda Butts A MASK when in public  . Avoid shaking hands with others and consider head nods or verbal greetings only. . Avoid touching your eyes, nose, or mouth with unwashed hands.  . Avoid close contact with people who are sick. . Avoid places or events with  large numbers of people in one location, like concerts or sporting events. . If you have some symptoms but not all symptoms, continue to monitor at home and seek medical attention if your symptoms worsen. . If you are having a medical emergency, call 911.   ADDITIONAL HEALTHCARE OPTIONS FOR PATIENTS  [facility] / e-Visit: [email or web address]         [facility]: [id number]  [facility]: [phone]                   [facility]: [phone]     It is flu season:   >>> Best ways to protect herself from the flu: Receive the yearly flu vaccine, practice good hand hygiene washing with soap and also using hand sanitizer when available, eat a nutritious meals, get adequate rest, hydrate appropriately   Please contact the office if your symptoms worsen or you have concerns that you are not improving.   Thank you for choosing [facility] for your healthcare, and for allowing [place] to partner with you on your healthcare journey. I am thankful to be able to provide care to you today.   Elisha Headland FNP-C

## 2019-10-22 ENCOUNTER — Ambulatory Visit: Payer: BC Managed Care – PPO | Attending: Orthopaedic Surgery | Admitting: Physical Therapy

## 2019-10-22 ENCOUNTER — Encounter: Payer: Self-pay | Admitting: Physical Therapy

## 2019-10-22 ENCOUNTER — Other Ambulatory Visit: Payer: Self-pay

## 2019-10-22 DIAGNOSIS — M542 Cervicalgia: Secondary | ICD-10-CM | POA: Diagnosis not present

## 2019-10-22 DIAGNOSIS — R29898 Other symptoms and signs involving the musculoskeletal system: Secondary | ICD-10-CM

## 2019-10-22 DIAGNOSIS — R293 Abnormal posture: Secondary | ICD-10-CM | POA: Insufficient documentation

## 2019-10-22 NOTE — Addendum Note (Signed)
Addended by: Cammie Mcgee on: 10/22/2019 05:44 PM   Modules accepted: Orders

## 2019-10-22 NOTE — Patient Instructions (Signed)
Access Code: [email or web address]: [email or web address]: 09/02/2021Prepared by: Casimiro Needle SherkExercises  Standing Cervical Retraction - 1 x daily - 7 x weekly - 3 sets - 10 reps  Lat Pull Down - 1 x daily - 7 x weekly - 3 sets - 10 reps  Standing Bent Over Single Arm Tricep Extension with Dumbbell and PLB - 1 x daily - 7 x weekly - 3 sets - 10 reps  Chest Press with Resistance - 1 x daily - 7 x weekly - 3 sets - 10 reps  Chest Press with TRX - 1 x daily - 7 x weekly - 3 sets - 10 reps  Scapular Retraction with Resistance - 1 x daily - 7 x weekly - 3 sets - 10 reps  Prone Scapular Retraction Y - 1 x daily - 7 x weekly - 3 sets - 10 reps  Prone Horizontal Abduction with Palms Down - 1 x daily - 7 x weekly - 3 sets - 10 reps  Prone Shoulder Flexion - 1 x daily - 7 x weekly - 3 sets - 10 reps  Full Superman on Table - 1 x daily - 7 x weekly - 3 sets - 10 reps  Sidelying Open Book Thoracic Lumbar Rotation and Extension - 1 x daily - 7 x weekly - 3 sets - 10 reps  Quadruped Thoracic Rotation - Reach Under - 1 x daily - 7 x weekly - 3 sets - 10 reps
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
## 2019-10-27 ENCOUNTER — Ambulatory Visit (INDEPENDENT_AMBULATORY_CARE_PROVIDER_SITE_OTHER): Payer: BC Managed Care – PPO | Admitting: Psychologist

## 2019-10-27 DIAGNOSIS — F430 Acute stress reaction: Secondary | ICD-10-CM | POA: Diagnosis not present

## 2019-10-28 ENCOUNTER — Encounter: Payer: Self-pay | Admitting: Family Medicine

## 2019-10-28 ENCOUNTER — Other Ambulatory Visit (HOSPITAL_COMMUNITY)
Admission: RE | Admit: 2019-10-28 | Discharge: 2019-10-28 | Disposition: A | Discharge status: Home / Self Care | Payer: BC Managed Care – PPO | Source: Ambulatory Visit | Attending: Family Medicine | Admitting: Family Medicine

## 2019-10-28 ENCOUNTER — Ambulatory Visit (INDEPENDENT_AMBULATORY_CARE_PROVIDER_SITE_OTHER): Payer: BC Managed Care – PPO | Admitting: Family Medicine

## 2019-10-28 ENCOUNTER — Ambulatory Visit (HOSPITAL_COMMUNITY)
Admission: RE | Admit: 2019-10-28 | Discharge: 2019-10-28 | Disposition: A | Discharge status: Home / Self Care | Payer: BC Managed Care – PPO | Source: Ambulatory Visit | Attending: Family Medicine | Admitting: Family Medicine

## 2019-10-28 ENCOUNTER — Other Ambulatory Visit: Payer: Self-pay

## 2019-10-28 VITALS — BP 118/81 | HR 83 | Resp 16 | Ht 65.0 in | Wt 206.0 lb

## 2019-10-28 DIAGNOSIS — R3912 Poor urinary stream: Secondary | ICD-10-CM

## 2019-10-28 DIAGNOSIS — E669 Obesity, unspecified: Secondary | ICD-10-CM

## 2019-10-28 DIAGNOSIS — Z23 Encounter for immunization: Secondary | ICD-10-CM

## 2019-10-28 DIAGNOSIS — Z87442 Personal history of urinary calculi: Secondary | ICD-10-CM | POA: Insufficient documentation

## 2019-10-28 DIAGNOSIS — R109 Unspecified abdominal pain: Secondary | ICD-10-CM | POA: Insufficient documentation

## 2019-10-28 DIAGNOSIS — I10 Essential (primary) hypertension: Secondary | ICD-10-CM

## 2019-10-28 DIAGNOSIS — Z202 Contact with and (suspected) exposure to infections with a predominantly sexual mode of transmission: Secondary | ICD-10-CM | POA: Insufficient documentation

## 2019-10-28 DIAGNOSIS — E785 Hyperlipidemia, unspecified: Secondary | ICD-10-CM

## 2019-10-28 DIAGNOSIS — F439 Reaction to severe stress, unspecified: Secondary | ICD-10-CM

## 2019-10-28 DIAGNOSIS — N3000 Acute cystitis without hematuria: Secondary | ICD-10-CM

## 2019-10-28 DIAGNOSIS — G43119 Migraine with aura, intractable, without status migrainosus: Secondary | ICD-10-CM

## 2019-10-28 DIAGNOSIS — E66811 Obesity, class 1: Secondary | ICD-10-CM

## 2019-10-28 DIAGNOSIS — R350 Frequency of micturition: Secondary | ICD-10-CM | POA: Insufficient documentation

## 2019-10-28 DIAGNOSIS — E559 Vitamin D deficiency, unspecified: Secondary | ICD-10-CM

## 2019-10-28 DIAGNOSIS — R7989 Other specified abnormal findings of blood chemistry: Secondary | ICD-10-CM

## 2019-10-28 LAB — POCT URINALYSIS DIP (CLINITEK)
Bilirubin, UA: NEGATIVE
Blood, UA: NEGATIVE
Glucose, UA: NEGATIVE mg/dL
Ketones, POC UA: NEGATIVE mg/dL
Leukocytes, UA: NEGATIVE
Nitrite, UA: NEGATIVE
POC PROTEIN,UA: NEGATIVE
Spec Grav, UA: 1.02
Urobilinogen, UA: 0.2 U/dL
pH, UA: 8.5 — AB

## 2019-10-28 MED ORDER — MONTELUKAST SODIUM 10 MG PO TABS: 10.0000 mg | ORAL_TABLET | Freq: Every day | ORAL | 0 refills | Status: DC

## 2019-10-28 MED ORDER — SULFAMETHOXAZOLE-TRIMETHOPRIM 800-160 MG PO TABS: 1.0000 | ORAL_TABLET | Freq: Two times a day (BID) | ORAL | 0 refills | Status: DC

## 2019-10-28 MED ORDER — FLUTICASONE PROPIONATE 50 MCG/ACT NA SUSP: 2.0000 | Freq: Every day | NASAL | 0 refills | Status: DC

## 2019-10-28 NOTE — Assessment & Plan Note (Signed)
Left flank pain new onset, h/o stones, will get kUB

## 2019-10-28 NOTE — Assessment & Plan Note (Signed)
Controlled, no change in medication DASH diet and commitment to daily physical activity for a minimum of 30 minutes discussed and encouraged, as a part of hypertension management. The importance of attaining a healthy weight is also discussed.  BP/Weight 10/28/2019 10/20/2019 10/06/2019 08/25/2019 06/10/2019 03/30/2019 02/09/2019  Systolic BP 118 - 120 123 118 360 118  Diastolic BP 81 - 75 79 78 70 84  Wt. (Lbs) 206.04 185 185 195 192.4 185 187  BMI 34.29 30.79 30.79 31.47 32.02 30.79 31.12

## 2019-10-28 NOTE — Progress Notes (Signed)
Devon Macdonald.     MRN: [id number]      DOB: 04/26/1980   HPI Devon Macdonald is here fwith a 1 month h/o poor urinary, stream, dysuria and frequency urgency and feeing of incomplrete emptying anf bad odor, Worsening, yesterday went 56 6times in 2 hrs. No feverr , chills or [place] d/c. No problems with sexual function  Had kidney stone 20 years ago feels as if this is the problem and c/o right flank pain   ROS Denies recent fever or chills. Denies sinus pressure, nasal congestion, ear pain or sore throat. Denies chest congestion, productive cough or wheezing. Denies chest pains, palpitations and leg swelling Denies , nausea, vomiting,diarrhea or constipation.   . Denies joint pain, swelling and limitation in mobility. Denies headaches, seizures, numbness, or tingling. Denies depression, anxiety or insomnia. Denies skin break down or rash.   PE  BP 118/81   Pulse 83   Resp 16   Ht 5\' 5"  (1.651 m)   Wt 206 lb 0.6 oz (93.5 kg)   SpO2 97%   BMI 34.29 kg/m   Patient alert and oriented and in no cardiopulmonary distress.  HEENT: No facial asymmetry, EOMI,     Neck supple .  Chest: Clear to auscultation bilaterally.  CVS: S1, S2 no murmurs, no S3.Regular rate.  ABD: Soft mild right renal angle tenderness  Ext: No edema  MS: Adequate ROM spine, shoulders, hips and knees.  Skin: Intact, no ulcerations or rash noted.  Psych: Good eye contact, normal affect. Memory intact not anxious or depressed appearing.  CNS: CN 2-12 intact, power,  normal throughout.no focal deficits noted.   Assessment & Plan  Essential hypertension Controlled, no change in medication DASH diet and commitment to daily physical activity for a minimum of 30 minutes discussed and encouraged, as a part of hypertension management. The importance of attaining a healthy weight is also discussed.  BP/Weight 10/28/2019 10/20/2019 10/06/2019 08/25/2019 06/10/2019 03/30/2019 02/09/2019  Systolic BP 118 -  120 123 118 118 118  Diastolic BP 81 - 75 79 78 70 84  Wt. (Lbs) 206.04 185 185 195 192.4 185 187  BMI 34.29 30.79 30.79 31.47 32.02 30.79 31.12       Obesity (BMI 30.0-34.9)  Patient re-educated about  the importance of commitment to a  minimum of 150 minutes of exercise per week as able.  The importance of healthy food choices with portion control discussed, as well as eating regularly and within a 12 hour window most days. The need to choose "clean , green" food 50 to 75% of the time is discussed, as well as to make water the primary drink and set a goal of 64 ounces water daily.    Weight /BMI 10/28/2019 10/20/2019 10/06/2019  WEIGHT 206 lb 0.6 oz 185 lb 185 lb  HEIGHT 5\' 5"  5\' 5"  5\' 5"   BMI 34.29 kg/m2 30.79 kg/m2 30.79 kg/m2      Stress at home Improved, no longer using/ needing xanax  Urinary frequency 1 month h/o worsening symptoms which include poor stream, dysuria and frequency. CCUA in office not suggestive of bacterial infection bu symptoms concerning for possible prostatism, or STD causing symptoms. Specimen for c/s , STD testing , septra x 10 days and urology eval  Flank pain Left flank pain new onset, h/o stones, will get kUB  Migraine headache Controlled, no change in medication   Hyperlipemia Hyperlipidemia:Low fat diet discussed and encouraged.   Lipid Panel  Lab Results  Component Value Date   CHOL 208 (H) 08/09/2017   HDL 48 08/09/2017   LDLCALC 135 (H) 08/09/2017   TRIG 129 08/09/2017   CHOLHDL 4.3 08/09/2017  Updated lab needed

## 2019-10-28 NOTE — Assessment & Plan Note (Signed)
Improved, no longer using/ needing xanax

## 2019-10-28 NOTE — Assessment & Plan Note (Signed)
Hyperlipidemia:Low fat diet discussed and encouraged.   Lipid Panel  Lab Results  Component Value Date   CHOL 208 (H) 08/09/2017   HDL 48 08/09/2017   LDLCALC 135 (H) 08/09/2017   TRIG 129 08/09/2017   CHOLHDL 4.3 08/09/2017  Updated lab needed

## 2019-10-28 NOTE — Assessment & Plan Note (Signed)
Controlled, no change in medication  

## 2019-10-28 NOTE — Assessment & Plan Note (Signed)
1 month h/o worsening symptoms which include poor stream, dysuria and frequency. CCUA in office not suggestive of bacterial infection bu symptoms concerning for possible prostatism, or STD causing symptoms. Specimen for c/s , STD testing , septra x 10 days and urology eval

## 2019-10-28 NOTE — Assessment & Plan Note (Signed)
  Patient re-educated about  the importance of commitment to a  minimum of 150 minutes of exercise per week as able.  The importance of healthy food choices with portion control discussed, as well as eating regularly and within a 12 hour window most days. The need to choose "clean , green" food 50 to 75% of the time is discussed, as well as to make water the primary drink and set a goal of 64 ounces water daily.    Weight /BMI 10/28/2019 10/20/2019 10/06/2019  WEIGHT 206 lb 0.6 oz 185 lb 185 lb  HEIGHT 5\' 5"  5\' 5"  5\' 5"   BMI 34.29 kg/m2 30.79 kg/m2 30.79 kg/m2

## 2019-10-28 NOTE — Patient Instructions (Signed)
Annual physical exam with MD in December , call if you need me before  Flu vaccine today  Labs today CBC, lipid, cmp and eGFr, tSH and vit D  Urine to be sent for STD testing and bacterial infection   Please get X ray of abdomen today, to evaluate for poossible kidney stone  You are referred urgently to Urologist in [place]  Ten day antibiotic course is prescribed, septra, take entire course,  Drink at lest 64 ounces water daily and empty often  It is important that you exercise regularly at least 30 minutes 5 times a week. If you develop chest pain, have severe difficulty breathing, or feel very tired, stop exercising immediately and seek medical attention  Think about what you will eat, plan ahead. Choose " clean, green, fresh or frozen" over canned, processed or packaged foods which are more sugary, salty and fatty. 70 to 75% of food eaten should be vegetables and fruit. Three meals at set times with snacks allowed between meals, but they must be fruit or vegetables. Aim to eat over a 12 hour period , example 7 am to 7 pm, and STOP after  your last meal of the day. Drink water,generally about 64 ounces per day, no other drink is as healthy. Fruit juice is best enjoyed in a healthy way, by EATING the fruit. Thanks for choosing [facility], we consider it a privelige to serve you.

## 2019-10-29 ENCOUNTER — Telehealth: Payer: Self-pay | Admitting: Family Medicine

## 2019-10-29 ENCOUNTER — Other Ambulatory Visit (HOSPITAL_COMMUNITY)
Admission: RE | Admit: 2019-10-29 | Discharge: 2019-10-29 | Disposition: A | Discharge status: Home / Self Care | Payer: BC Managed Care – PPO | Source: Other Acute Inpatient Hospital | Attending: Diagnostic Radiology | Admitting: Diagnostic Radiology

## 2019-10-29 DIAGNOSIS — N3000 Acute cystitis without hematuria: Secondary | ICD-10-CM | POA: Insufficient documentation

## 2019-10-29 LAB — CBC
Hematocrit: 50.9 % (ref 37.5–51.0)
Hemoglobin: 16.9 g/dL (ref 13.0–17.7)
MCH: 28.8 pg (ref 26.6–33.0)
MCHC: 33.2 g/dL (ref 31.5–35.7)
MCV: 87 fL (ref 79–97)
Platelets: 349 10*3/uL (ref 150–450)
RBC: 5.87 x10E6/uL — ABNORMAL HIGH (ref 4.14–5.80)
RDW: 12.7 % (ref 11.6–15.4)
WBC: 7.8 10*3/uL (ref 3.4–10.8)

## 2019-10-29 LAB — CMP14+EGFR
ALT: 59 IU/L — ABNORMAL HIGH (ref 0–44)
AST: 34 IU/L (ref 0–40)
Albumin/Globulin Ratio: 1.6 (ref 1.2–2.2)
Albumin: 4.9 g/dL (ref 4.0–5.0)
Alkaline Phosphatase: 103 IU/L (ref 48–121)
BUN/Creatinine Ratio: 11 (ref 9–20)
BUN: 12 mg/dL (ref 6–20)
Bilirubin Total: 0.6 mg/dL (ref 0.0–1.2)
CO2: 24 mmol/L (ref 20–29)
Calcium: 9.8 mg/dL (ref 8.7–10.2)
Chloride: 100 mmol/L (ref 96–106)
Creatinine, Ser: 1.09 mg/dL (ref 0.76–1.27)
GFR calc Af Amer: 99 mL/min/{1.73_m2} (ref >59)
GFR calc non Af Amer: 86 mL/min/{1.73_m2} (ref >59)
Globulin, Total: 3 g/dL (ref 1.5–4.5)
Glucose: 82 mg/dL (ref 65–99)
Potassium: 3.8 mmol/L (ref 3.5–5.2)
Sodium: 139 mmol/L (ref 134–144)
Total Protein: 7.9 g/dL (ref 6.0–8.5)

## 2019-10-29 LAB — LIPID PANEL
Chol/HDL Ratio: 5 ratio (ref 0.0–5.0)
Cholesterol, Total: 234 mg/dL — ABNORMAL HIGH (ref 100–199)
HDL: 47 mg/dL (ref >39)
LDL Chol Calc (NIH): 151 mg/dL — ABNORMAL HIGH (ref 0–99)
Triglycerides: 200 mg/dL — ABNORMAL HIGH (ref 0–149)
VLDL Cholesterol Cal: 36 mg/dL (ref 5–40)

## 2019-10-29 LAB — VITAMIN D 25 HYDROXY (VIT D DEFICIENCY, FRACTURES): Vit D, 25-Hydroxy: 24.8 ng/mL — ABNORMAL LOW (ref 30.0–100.0)

## 2019-10-29 LAB — TSH: TSH: 3.84 u[IU]/mL (ref 0.450–4.500)

## 2019-10-29 NOTE — Telephone Encounter (Signed)
Patient aware provider out of the office until Tuesday. Explained to patient that the provider has not had a chance to review the labs and once she has we will be calling with the results.

## 2019-10-29 NOTE — Telephone Encounter (Signed)
Patient called he has received his blood work and has questions about why his HCL AND LDL levels are elevated phone is [phone]

## 2019-10-30 LAB — URINE CYTOLOGY ANCILLARY ONLY
Chlamydia: NEGATIVE
Comment: NEGATIVE
Comment: NEGATIVE
Comment: NORMAL
Neisseria Gonorrhea: NEGATIVE
Trichomonas: NEGATIVE

## 2019-10-30 LAB — URINE CULTURE: Culture: NO GROWTH

## 2019-10-31 NOTE — Telephone Encounter (Signed)
I have left lab result notes in his chart, please review them with him, thanks

## 2019-11-03 ENCOUNTER — Other Ambulatory Visit: Payer: Self-pay

## 2019-11-03 ENCOUNTER — Encounter: Payer: Self-pay | Admitting: Urology

## 2019-11-03 ENCOUNTER — Other Ambulatory Visit
Admission: RE | Admit: 2019-11-03 | Discharge: 2019-11-03 | Disposition: A | Discharge status: Home / Self Care | Payer: BC Managed Care – PPO | Attending: Urology | Admitting: Urology

## 2019-11-03 ENCOUNTER — Ambulatory Visit (INDEPENDENT_AMBULATORY_CARE_PROVIDER_SITE_OTHER): Payer: BC Managed Care – PPO | Admitting: Urology

## 2019-11-03 ENCOUNTER — Other Ambulatory Visit: Payer: Self-pay | Admitting: *Deleted

## 2019-11-03 VITALS — BP 139/96 | HR 87 | Ht 65.0 in | Wt 210.0 lb

## 2019-11-03 DIAGNOSIS — N3000 Acute cystitis without hematuria: Secondary | ICD-10-CM

## 2019-11-03 DIAGNOSIS — N410 Acute prostatitis: Secondary | ICD-10-CM | POA: Diagnosis not present

## 2019-11-03 DIAGNOSIS — Z3009 Encounter for other general counseling and advice on contraception: Secondary | ICD-10-CM

## 2019-11-03 LAB — URINALYSIS, COMPLETE (UACMP) WITH MICROSCOPIC
Bilirubin Urine: NEGATIVE
Glucose, UA: NEGATIVE mg/dL
Ketones, ur: NEGATIVE mg/dL
Leukocytes,Ua: NEGATIVE
Nitrite: NEGATIVE
Protein, ur: NEGATIVE mg/dL
Specific Gravity, Urine: 1.02 (ref 1.005–1.030)
Squamous Epithelial / HPF: NONE SEEN (ref 0–5)
pH: 7 (ref 5.0–8.0)

## 2019-11-03 LAB — BLADDER SCAN AMB NON-IMAGING: Scan Result: 0

## 2019-11-03 MED ORDER — DIAZEPAM 5 MG PO TABS: 5.0000 mg | ORAL_TABLET | Freq: Once | ORAL | 0 refills | Status: DC | PRN

## 2019-11-03 NOTE — Progress Notes (Signed)
11/03/19 3:02 PM   Devon Macdonald. 07/13/1980 [id number]  CC: Prostatitis, discuss vasectomy  HPI: I saw Devon Macdonald in urology clinic today for the above issues.  He is a 39 year old healthy male who was having a month of bothersome urinary symptoms including frequency, urgency, dysuria, feeling of incomplete emptying, and foul-smelling urine and ultimately saw his PCP.  Urine culture was negative, but he was treated with Bactrim for presumed UTI/prostatitis.  He reports his symptoms have completely resolved since starting the antibiotic.  He denies any gross hematuria.  He denies any urinary complaints today.  Urinalysis today consistent with resolving prostatitis with 6-10 RBCs, but otherwise benign.  He also is interested in discussing vasectomy.  He has 3 children currently and is not interested in further biologic pregnancies.   PMH: Past Medical History:  Diagnosis Date  . Allergic cough 06/23/2014  . Elevated LFTs   . Epididymitis   . Hypertension   . Obesity     Surgical History: Past Surgical History:  Procedure Laterality Date  . Broke right femur  1997  . broke right hand  2003  . lithrostripsy  2003    Family History: Family History  Problem Relation Age of Onset  . Diabetes Mother   . Diabetes Father     Social History:  reports that he has never smoked. He has never used smokeless tobacco. He reports that he does not drink alcohol and does not use drugs.  Physical Exam: BP (!) 139/96   Pulse 87   Ht 5\' 5"  (1.651 m)   Wt 210 lb (95.3 kg)   BMI 34.95 kg/m    Constitutional:  Alert and oriented, No acute distress. Cardiovascular: No clubbing, cyanosis, or edema. Respiratory: Normal respiratory effort, no increased work of breathing. GI: Abdomen is soft, nontender, nondistended, no abdominal masses GU: Phallus with patent meatus, no lesions, testicles 20 cc and descended bilaterally, vas deferens easily palpable  Laboratory Data: Reviewed see  HPI  Pertinent Imaging: None to review  Assessment & Plan:   In summary, he is a 39 year old male with recent episode of likely prostatitis, with symptoms now completely resolved after course of Bactrim, who is also interested in vasectomy.  We discussed the risks and benefits of vasectomy at length.  Vasectomy is intended to be a permanent form of contraception, and does not produce immediate sterility.  Following vasectomy another form of contraception is required until vas occlusion is confirmed by a post-vasectomy semen analysis obtained 2-3 months after the procedure.  Even after vas occlusion is confirmed, vasectomy is not 100% reliable in preventing pregnancy, and the failure rate is approximately 02/1998.  Repeat vasectomy is required in less than 1% of patients.  He should refrain from ejaculation for 1 week after vasectomy.  Options for fertility after vasectomy include vasectomy reversal, and sperm retrieval with in vitro fertilization or ICSI.  These options are not always successful and may be expensive.  Finally, there are other permanent and non-permanent alternatives to vasectomy available. There is no risk of erectile dysfunction, and the volume of semen will be similar to prior, as the majority of the ejaculate is from the prostate and seminal vesicles.   The procedure takes ~20 minutes.  We recommend patients take 5-10 mg of Valium 30 minutes prior, and he will need a driver post-procedure.  Local anesthetic is injected into the scrotal skin and a small segment of the vas deferens is removed, and the ends occluded. The complication  rate is approximately 1-2%, and includes bleeding, infection, and development of chronic scrotal pain.  PLAN: Pending insurance approval, will schedule for vasectomy at his convenience   Finish course of Bactrim Schedule vasectomy   Legrand Rams, MD 11/03/2019  [facility] [address] [place], [place]  [address] [phone]

## 2019-11-03 NOTE — Patient Instructions (Addendum)
Prostatitis  Prostatitis is swelling or inflammation of the prostate gland. The prostate is a walnut-sized gland that is involved in the production of semen. It is located below a man's bladder, in front of the rectum. There are four types of prostatitis:  Chronic nonbacterial prostatitis. This is the most common type of prostatitis. It may be associated with a viral infection or autoimmune disorder.  Acute bacterial prostatitis. This is the least common type of prostatitis. It starts quickly and is usually associated with a bladder infection, high fever, and shaking chills. It can occur at any age.  Chronic bacterial prostatitis. This type usually results from acute bacterial prostatitis that happens repeatedly (is recurrent) or has not been treated properly. It can occur in men of any age but is most common among middle-aged men whose prostate has begun to get larger. The symptoms are not as severe as symptoms caused by acute bacterial prostatitis.  Prostatodynia or chronic pelvic pain syndrome (CPPS). This type is also called pelvic floor disorder. It is associated with increased muscular tone in the pelvis surrounding the prostate. What are the causes? Bacterial prostatitis is caused by infection from bacteria. Chronic nonbacterial prostatitis may be caused by:  Urinary tract infections (UTIs).  Nerve damage.  A response by the body's disease-fighting system (autoimmune response).  Chemicals in the urine. The causes of the other types of prostatitis are usually not known. What are the signs or symptoms? Symptoms of this condition vary depending upon the type of prostatitis. If you have acute bacterial prostatitis, you may experience:  Urinary symptoms, such as: ? Painful urination. ? Burning during urination. ? Frequent and sudden urges to urinate. ? Inability to start urinating. ? A weak or interrupted stream of urine.  Vomiting.  Nausea.  Fever.  Chills.  Inability to  empty the bladder completely.  Pain in the: ? Muscles or joints. ? Lower back. ? Lower abdomen. If you have any of the other types of prostatitis, you may experience:  Urinary symptoms, such as: ? Sudden urges to urinate. ? Frequent urination. ? Difficulty starting urination. ? Weak urine stream. ? Dribbling after urination.  Discharge from the urethra. The urethra is a tube that opens at the end of the penis.  Pain in the: ? Testicles. ? Penis or tip of the penis. ? Rectum. ? Area in front of the rectum and below the scrotum (perineum).  Problems with sexual function.  Painful ejaculation.  Bloody semen. How is this diagnosed? This condition may be diagnosed based on:  A physical and medical exam.  Your symptoms.  A urine test to check for bacteria.  An exam in which a health care provider uses a finger to feel the prostate (digital rectal exam).  A test of a sample of semen.  Blood tests.  Ultrasound.  Removal of prostate tissue to be examined under a microscope (biopsy).  Tests to check how your body handles urine (urodynamic tests).  A test to look inside your bladder or urethra (cystoscopy). How is this treated? Treatment for this condition depends on the type of prostatitis. Treatment may involve:  Medicines to relieve pain or inflammation.  Medicines to help relax your muscles.  Physical therapy.  Heat therapy.  Techniques to help you control certain body functions (biofeedback).  Relaxation exercises.  Antibiotic medicine, if your condition is caused by bacteria.  Warm water baths (sitz baths). Sitz baths help with relaxing your pelvic floor muscles, which helps to relieve pressure on the prostate.   Follow these instructions at home:   Take over-the-counter and prescription medicines only as told by your health care provider.  If you were prescribed an antibiotic, take it as told by your health care provider. Do not stop taking the  antibiotic even if you start to feel better.  If physical therapy, biofeedback, or relaxation exercises were prescribed, do exercises as instructed.  Take sitz baths as directed by your health care provider. For a sitz bath, sit in warm water that is deep enough to cover your hips and buttocks.  Keep all follow-up visits as told by your health care provider. This is important. Contact a health care provider if:  Your symptoms get worse.  You have a fever. Get help right away if:  You have chills.  You feel nauseous.  You vomit.  You feel light-headed or feel like you are going to faint.  You are unable to urinate.  You have blood or blood clots in your urine. This information is not intended to replace advice given to you by your health care provider. Make sure you discuss any questions you have with your health care provider. Document Revised: 04/20/2017 Document Reviewed: 10/27/2015 Elsevier Patient Education  2020 [employer].  Vasectomy Vasectomy is a procedure in which the tube that carries sperm from the testicle to the urethra (vas deferens) is tied. It may also be cut. The procedure blocks sperm from going through the vas deferens and penis during ejaculation. This ensures that sperm does not go into the vagina during sex. Vasectomy does not affect your sexual desire or performance, and does not prevent sexually transmitted diseases. Vasectomy is considered a permanent and very effective form of birth control (contraception). The decision to have a vasectomy should not be made during a stressful situation, such as after the loss of a pregnancy or a divorce. You and your partner should make the decision to have a vasectomy when you are sure that you do not want children in the future. Tell a health care provider about:  Any allergies you have.  All medicines you are taking, including vitamins, herbs, eye drops, creams, and over-the-counter medicines.  Any problems you  or family members have had with anesthetic medicines.  Any blood disorders you have.  Any surgeries you have had.  Any medical conditions you have. What are the risks? Generally, this is a safe procedure. However, problems may occur, including:  Infection.  Bleeding and swelling of the scrotum.  Allergic reactions to medicines.  Failure of the procedure to prevent pregnancy. There is a very small chance that the cut ends of the vas deferens may reconnect (recanalization), meaning that you could still make a woman pregnant.  Pain in the scrotum that continues after healing from the procedure. What happens before the procedure?  Ask your health care provider about: ? Changing or stopping your regular medicines. This is especially important if you are taking diabetes medicines or blood thinners. ? Taking over-the-counter medicines, vitamins, herbs, and supplements. ? Taking medicines such as aspirin and ibuprofen. These medicines can thin your blood. Do not take these medicines unless your health care provider tells you to take them.  You may be asked to shower with a germ-killing soap.  Plan to have someone take you home from the hospital or clinic. What happens during the procedure?   To lower your risk of infection: ? Your health care team will wash or sanitize their hands. ? Hair may be removed from the surgical  area. ? Your scrotum will be washed with soap.  You will be given one or more of the following: ? A medicine to help you relax (sedative). You may be instructed to take this a few hours before the procedure. ? A medicine to numb the area (local anesthetic).  Your health care provider will feel (palpate) for your vas deferens.  To reach the vas deferens, one of two methods may be used: ? A very small incision may be made in your scrotum. ? A punctured opening may be made in your scrotum, without an incision.  Your vas deferens will be pulled out of your scrotum,  and may be: ? Tied off. ? Cut and possibly burned (cauterized) at the ends to seal them off.  The vas deferens will be put back into your scrotum.  The incision or puncture opening will be closed with absorbable stitches (sutures). The sutures will eventually dissolve and will not need to be removed after the procedure. The procedure may vary among health care providers and hospitals. What happens after the procedure?  You will be monitored to make sure that you do not experience problems.  You will be asked not to ejaculate for at least 1 week after the procedure, or as long as directed.  You will need to use a different form of contraception for 2-4 months after the procedure, until you have test results confirming that there are no sperm in your semen.  You may be given scrotal support to wear, such as a jock strap or underwear with a supportive pouch.  Do not drive for 24 hours if you were given a sedative to help you relax. Summary  Vasectomy is considered a permanent and very effective form of birth control (contraception). The procedure prevents sperm from being released during ejaculation.  Your scrotum will be numbed with medicine (local anesthetic) for the procedure.  After the procedure, you will be asked not to ejaculate for at least 1 week, or for as long as directed. You will also need to use a different form of contraception until your health care provider examines you and finds that there are no sperm in your semen. This information is not intended to replace advice given to you by your health care provider. Make sure you discuss any questions you have with your health care provider. Document Revised: 08/09/2017 Document Reviewed: 05/04/2016 Elsevier Patient Education  2020 [employer].

## 2019-11-10 ENCOUNTER — Other Ambulatory Visit: Payer: Self-pay

## 2019-11-10 ENCOUNTER — Encounter: Payer: Self-pay | Admitting: Family Medicine

## 2019-11-10 ENCOUNTER — Telehealth: Payer: BC Managed Care – PPO | Admitting: Family Medicine

## 2019-11-10 VITALS — BP 118/78 | Ht 65.0 in | Wt 210.0 lb

## 2019-11-10 DIAGNOSIS — E785 Hyperlipidemia, unspecified: Secondary | ICD-10-CM

## 2019-11-10 DIAGNOSIS — R7989 Other specified abnormal findings of blood chemistry: Secondary | ICD-10-CM | POA: Diagnosis not present

## 2019-11-10 DIAGNOSIS — T7840XA Allergy, unspecified, initial encounter: Secondary | ICD-10-CM | POA: Diagnosis not present

## 2019-11-10 MED ORDER — PREDNISONE 5 MG (21) PO TBPK: 5.0000 mg | ORAL_TABLET | ORAL | 0 refills | Status: DC

## 2019-11-10 MED ORDER — ROSUVASTATIN CALCIUM 5 MG PO TABS: 5.0000 mg | ORAL_TABLET | Freq: Every day | ORAL | 1 refills | Status: DC

## 2019-11-10 NOTE — Assessment & Plan Note (Signed)
Avoid septra, and prednisone dose pack is prescribed

## 2019-11-10 NOTE — Assessment & Plan Note (Signed)
Start crestor and follow low fat diet

## 2019-11-10 NOTE — Progress Notes (Signed)
Virtual Visit via Video Note   This visit type was conducted due to national recommendations for restrictions regarding the COVID-19 Pandemic (e.g. social distancing) in an effort to limit this patient's exposure and mitigate transmission in our community.  Due to his co-morbid illnesses, this patient is at least at moderate risk for complications without adequate follow up.  This format is felt to be most appropriate for this patient at this time.  All issues noted in this document were discussed and addressed.  A limited physical exam was performed with this format.  Please refer to the patient's chart for his consent to telehealth for [facility]      Evaluation Performed:  Follow-up visit  Date:  11/10/2019   ID:  Devon Macdonald., DOB 1980/09/25, MRN [id number]  Patient Location: Home Provider Location: Office/Clinic  Location of Patient: Home Location of Provider: Telehealth Consent was obtain for visit to be over via telehealth. I verified that I am speaking with the correct person using two identifiers.  PCP:  Kerri Perches, MD   Chief Complaint:  Allergic reactiom  History of Present Illness:    Devon Macdonald. is a 39 y.o. male with *5 fay h/o allergic reaction, breaking out in hives with sulfa antibiotic, denies difficulty breathing or swallowing, slight improvementThe patient does not: have symptoms concerning for COVID-19 infection (fever, chills, cough, or new shortness of breath).   Past Medical, Surgical, Social History, Allergies, and Medications have been Reviewed.  Past Medical History:  Diagnosis Date  . Allergic cough 06/23/2014  . Elevated LFTs   . Epididymitis   . Hypertension   . Obesity    Past Surgical History:  Procedure Laterality Date  . Broke right femur  1997  . broke right hand  2003  . lithrostripsy  2003     Current Meds  Medication Sig  . albuterol (VENTOLIN HFA) 108 (90 Base) MCG/ACT inhaler Inhale into the  lungs.  . diazepam (VALIUM) 5 MG tablet Take 1 tablet (5 mg total) by mouth once as needed for up to 1 dose (take 30 minutes prior to vasectomy).  Marland Kitchen EPINEPHrine 0.3 mg/0.3 mL IJ SOAJ injection Inject 0.3 mLs (0.3 mg total) into the muscle as needed for anaphylaxis.  . fluticasone (FLONASE) 50 MCG/ACT nasal spray Place 2 sprays into both nostrils daily.  . methocarbamol (ROBAXIN) 500 MG tablet Take 1 tablet (500 mg total) by mouth every 8 (eight) hours as needed for muscle spasms.  . montelukast (SINGULAIR) 10 MG tablet Take 1 tablet (10 mg total) by mouth at bedtime.  . Multiple Vitamin (MULTIVITAMIN WITH MINERALS) TABS Take 1 tablet by mouth daily.  . SUMAtriptan (IMITREX) 100 MG tablet TAKE 1 TAB AT ONSET OF HEADACHE. MAY REPEAT ONCE IN 2 HOURS IF HEADACHE PERSISTS. MAX 2 TIMES A WEEK  . topiramate (TOPAMAX) 100 MG tablet TAKE 1 TABLET BY MOUTH TWICE A DAY  . triamterene-hydrochlorothiazide (MAXZIDE) 75-50 MG tablet Take 1 tablet by mouth daily.     Allergies:   Other, Bee venom, Doxycycline, and Sulfa antibiotics   ROS:   Please see the history of present illness.    Reports improvement ofurinary symptoms, though did not complete entire antibiotic course no interest in additional medication All other systems reviewed and are negative.   Labs/Other Tests and Data Reviewed:    Recent Labs: 10/28/2019: ALT 59; BUN 12; Creatinine, Ser 1.09; Hemoglobin 16.9; Platelets 349; Potassium 3.8; Sodium 139; TSH 3.840  Recent Lipid Panel Lab Results  Component Value Date/Time   CHOL 234 (H) 10/28/2019 03:16 PM   TRIG 200 (H) 10/28/2019 03:16 PM   HDL 47 10/28/2019 03:16 PM   CHOLHDL 5.0 10/28/2019 03:16 PM   CHOLHDL 4.3 08/09/2017 12:00 AM   LDLCALC 151 (H) 10/28/2019 03:16 PM   LDLCALC 135 (H) 08/09/2017 12:00 AM    Wt Readings from Last 3 Encounters:  11/10/19 210 lb (95.3 kg)  11/03/19 210 lb (95.3 kg)  10/28/19 206 lb 0.6 oz (93.5 kg)     Objective:    Vital Signs:  BP 118/78    Ht 5\' 5"  (1.651 m)   Wt 210 lb (95.3 kg)   BMI 34.95 kg/m      ASSESSMENT & PLAN:   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 Pt advised not to take similar medication in the future and prednisone dose pack is prescribed. He will use benadryl which he already has for itching at bedtime as needed yperlipidat diet and start crestor 5 mg  Elevated LFT, advised to work on weight loss and non fast hepatic panel to be repeated in 4 weeks   Time:   Today, I have spent 12 minutes with the patient with telehealth technology discussing the above problems.     Medication Adjustments/Labs and Tests Ordered: Current medicines are reviewed at length with the patient today.  Concerns regarding medicines are outlined above.   Tests Ordered: Non fast hepatic panel  Medication Changes: Meds ordered this encounter  Medications  . predniSONE (STERAPRED UNI-PAK 21 TAB) 5 MG (21) TBPK tablet    Sig: Take 1 tablet (5 mg total) by mouth as directed. Use as directed    Dispense:  21 tablet    Refill:  0  . rosuvastatin (CRESTOR) 5 MG tablet    Sig: Take 1 tablet (5 mg total) by mouth daily.    Dispense:  90 tablet    Refill:  1    Disposition:  Follow up  Signed, , MD  11/10/2019 4:34 PM     11/12/2019 [facility]

## 2019-11-10 NOTE — Assessment & Plan Note (Signed)
Weight loss and review in 4 weeks

## 2019-11-10 NOTE — Patient Instructions (Signed)
F/u as before , call if you need me sooner  Septra is noted as a medication you cannot take  New is daily crestor to lower cholesterol  Please get non fasting hepatic panel in 4 weeks  For the allergic rash prednisone is prescribed for 6 days  It is important that you exercise regularly at least 30 minutes 5 times a week. If you develop chest pain, have severe difficulty breathing, or feel very tired, stop exercising immediately and seek medical attention  Think about what you will eat, plan ahead. Choose " clean, green, fresh or frozen" over canned, processed or packaged foods which are more sugary, salty and fatty. 70 to 75% of food eaten should be vegetables and fruit. Three meals at set times with snacks allowed between meals, but they must be fruit or vegetables. Aim to eat over a 12 hour period , example 7 am to 7 pm, and STOP after  your last meal of the day. Drink water,generally about 64 ounces per day, no other drink is as healthy. Fruit juice is best enjoyed in a healthy way, by EATING the fruit. Marland Kitchenty

## 2019-11-12 ENCOUNTER — Telehealth: Payer: Self-pay | Admitting: Orthopaedic Surgery

## 2019-11-12 NOTE — Telephone Encounter (Signed)
Pt called wanting to know if either Barry Dienes or Dr.Yates would be willing to write him a letter stating they referred him to PT? If so pt would like to have this emailed to him  [email or web address]

## 2019-11-12 NOTE — Telephone Encounter (Signed)
Ok for note 

## 2019-11-12 NOTE — Telephone Encounter (Signed)
You can write on a Rx and stamp that we sent him to PT for his cervical spine and he has completed therapy . thanks

## 2019-11-17 NOTE — Telephone Encounter (Signed)
I did the note already. Can you please print, and stamp it and let patient know completed?

## 2019-11-17 NOTE — Telephone Encounter (Signed)
I called the pt and he stated he was good with the one in my chart.

## 2019-11-19 ENCOUNTER — Other Ambulatory Visit: Payer: Self-pay | Admitting: Family Medicine

## 2019-11-19 ENCOUNTER — Ambulatory Visit (INDEPENDENT_AMBULATORY_CARE_PROVIDER_SITE_OTHER): Payer: BC Managed Care – PPO | Admitting: Psychologist

## 2019-11-19 DIAGNOSIS — F430 Acute stress reaction: Secondary | ICD-10-CM | POA: Diagnosis not present

## 2019-12-07 ENCOUNTER — Ambulatory Visit (INDEPENDENT_AMBULATORY_CARE_PROVIDER_SITE_OTHER): Payer: BC Managed Care – PPO | Admitting: Psychologist

## 2019-12-07 DIAGNOSIS — F430 Acute stress reaction: Secondary | ICD-10-CM

## 2019-12-15 ENCOUNTER — Ambulatory Visit (INDEPENDENT_AMBULATORY_CARE_PROVIDER_SITE_OTHER): Payer: BC Managed Care – PPO | Admitting: Psychologist

## 2019-12-15 DIAGNOSIS — F430 Acute stress reaction: Secondary | ICD-10-CM

## 2019-12-19 ENCOUNTER — Other Ambulatory Visit: Payer: Self-pay | Admitting: Family Medicine

## 2020-01-04 ENCOUNTER — Other Ambulatory Visit: Payer: Self-pay | Admitting: Family Medicine

## 2020-01-08 ENCOUNTER — Encounter: Payer: BC Managed Care – PPO | Admitting: Urology

## 2020-01-08 ENCOUNTER — Ambulatory Visit (INDEPENDENT_AMBULATORY_CARE_PROVIDER_SITE_OTHER): Payer: BC Managed Care – PPO | Admitting: Psychologist

## 2020-01-08 DIAGNOSIS — F430 Acute stress reaction: Secondary | ICD-10-CM | POA: Diagnosis not present

## 2020-01-15 ENCOUNTER — Other Ambulatory Visit: Payer: Self-pay | Admitting: Family Medicine

## 2020-01-19 ENCOUNTER — Other Ambulatory Visit: Payer: Self-pay | Admitting: Family Medicine

## 2020-02-05 ENCOUNTER — Encounter: Payer: BC Managed Care – PPO | Admitting: Urology

## 2020-02-09 ENCOUNTER — Encounter: Payer: BC Managed Care – PPO | Admitting: Family Medicine

## 2020-02-10 ENCOUNTER — Ambulatory Visit (INDEPENDENT_AMBULATORY_CARE_PROVIDER_SITE_OTHER): Payer: BC Managed Care – PPO | Admitting: Psychologist

## 2020-02-10 DIAGNOSIS — F430 Acute stress reaction: Secondary | ICD-10-CM

## 2020-03-04 ENCOUNTER — Encounter: Payer: BC Managed Care – PPO | Admitting: Urology

## 2020-03-05 ENCOUNTER — Other Ambulatory Visit: Payer: Self-pay

## 2020-03-05 DIAGNOSIS — Z20822 Contact with and (suspected) exposure to covid-19: Secondary | ICD-10-CM

## 2020-03-08 LAB — NOVEL CORONAVIRUS, NAA: SARS-CoV-2, NAA: NOT DETECTED

## 2020-04-08 ENCOUNTER — Other Ambulatory Visit: Payer: Self-pay

## 2020-04-08 ENCOUNTER — Ambulatory Visit: Payer: BC Managed Care – PPO | Admitting: Urology

## 2020-04-08 ENCOUNTER — Encounter: Payer: Self-pay | Admitting: Urology

## 2020-04-08 VITALS — BP 135/86 | HR 83 | Ht 65.0 in | Wt 205.0 lb

## 2020-04-08 DIAGNOSIS — Z302 Encounter for sterilization: Secondary | ICD-10-CM | POA: Diagnosis not present

## 2020-04-08 MED ORDER — HYDROCODONE-ACETAMINOPHEN 5-325 MG PO TABS: 1.0000 | ORAL_TABLET | Freq: Four times a day (QID) | ORAL | 0 refills | Status: DC | PRN

## 2020-04-08 NOTE — Patient Instructions (Signed)

## 2020-04-10 ENCOUNTER — Encounter: Payer: Self-pay | Admitting: Urology

## 2020-04-10 NOTE — Progress Notes (Signed)
Vasectomy Procedure Note  Indications: The patient is a 39 y.o. male who presents today for elective sterilization.  He has been consented for the procedure.  He is aware of the risks and benefits.  He had no additional questions.  He agrees to proceed.  He denies any other significant change since his last visit.  Pre-operative Diagnosis: Elective sterilization  Post-operative Diagnosis: Elective sterilization  Premedication: Valium 10 mg po  Surgeon: Scott C. Stoioff, M.D  Description: The patient was prepped and draped in the standard fashion.  The right vas deferens was identified and brought superiorly to the anterior scrotal skin.  The skin and vas was then anesthetized utilizing 6ml 1% lidocaine.  A small stab incision was made and spread with the vas dissector.  The vas was grasped utilizing the vas clamp and elevated out of the incision.  The vas was dissected free from surrounding tissue and vessels and an ~1 cm segment was excised.  The vas lumens were cauterized utilizing electrocautery.  The distal segment was buried in the surrounding sheath with a 3-0 chromic suture.  No significant bleeding was observed.  The vas ends were then dropped back into the hemiscrotum.  The skin was closed with hemostatic pressure.  An identical procedure was performed on the contralateral side.  Clean dry gauze was applied to the incision sites.  The patient tolerated the procedure well.  Complications:None  Recommendations: 1.  No lifting greater than 10 pounds or strenuousactivity for 1 week. 2.  Scrotal support for 1 week. 3.  Shower only for 1 week; may shower in the morning 4.  May resume intercourse in one week if no significant discomfort.  Continue alternate contraception for 12 weeks.  5.  Call for significant pain, swelling, redness, drainage or fever greater than 100.5. 6.  Rx hydrocodone/APAP 5/325 1-2 every 6 hours as needed for pain. 7.  Follow-up semen analysis in 12  weeks.   Scott Stoioff, MD 

## 2020-04-11 ENCOUNTER — Ambulatory Visit: Payer: BC Managed Care – PPO | Admitting: Family Medicine

## 2020-04-13 ENCOUNTER — Telehealth: Payer: Self-pay | Admitting: *Deleted

## 2020-04-15 ENCOUNTER — Other Ambulatory Visit: Payer: Self-pay | Admitting: Family Medicine

## 2020-04-18 MED ORDER — SUMATRIPTAN SUCCINATE 100 MG PO TABS: ORAL_TABLET | ORAL | 0 refills | Status: DC

## 2020-04-18 MED ORDER — TOPIRAMATE 100 MG PO TABS
100.0000 mg | ORAL_TABLET | Freq: Two times a day (BID) | ORAL | 1 refills | Status: DC
Start: 2020-04-18 — End: 2020-11-07

## 2020-04-19 NOTE — Telephone Encounter (Signed)
error 

## 2020-04-23 ENCOUNTER — Other Ambulatory Visit: Payer: Self-pay | Admitting: Family Medicine

## 2020-05-03 ENCOUNTER — Other Ambulatory Visit: Payer: Self-pay | Admitting: Family Medicine

## 2020-05-17 ENCOUNTER — Other Ambulatory Visit: Payer: Self-pay | Admitting: Family Medicine

## 2020-06-18 ENCOUNTER — Other Ambulatory Visit: Payer: Self-pay | Admitting: Family Medicine

## 2020-07-06 ENCOUNTER — Other Ambulatory Visit: Payer: Self-pay

## 2020-07-06 ENCOUNTER — Other Ambulatory Visit: Payer: Self-pay | Admitting: Family Medicine

## 2020-07-06 DIAGNOSIS — Z9852 Vasectomy status: Secondary | ICD-10-CM

## 2020-07-15 ENCOUNTER — Other Ambulatory Visit: Payer: Self-pay | Admitting: Family Medicine

## 2020-07-15 DIAGNOSIS — I10 Essential (primary) hypertension: Secondary | ICD-10-CM

## 2020-08-23 ENCOUNTER — Other Ambulatory Visit: Payer: Self-pay

## 2020-09-02 ENCOUNTER — Other Ambulatory Visit: Payer: BC Managed Care – PPO

## 2020-09-02 ENCOUNTER — Other Ambulatory Visit: Payer: Self-pay

## 2020-09-02 DIAGNOSIS — Z9852 Vasectomy status: Secondary | ICD-10-CM

## 2020-09-03 LAB — POST-VAS SPERM EVALUATION,QUAL: Volume: 0.8 mL

## 2020-09-05 ENCOUNTER — Encounter: Payer: Self-pay | Admitting: Family Medicine

## 2020-09-05 ENCOUNTER — Telehealth: Payer: Self-pay | Admitting: *Deleted

## 2020-09-05 NOTE — Telephone Encounter (Signed)
Spoke with patient and advised results   

## 2020-09-05 NOTE — Telephone Encounter (Signed)
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  Okay to use vasectomy as primary contraception.

## 2020-09-09 ENCOUNTER — Encounter: Payer: Self-pay | Admitting: Family Medicine

## 2020-11-07 ENCOUNTER — Other Ambulatory Visit: Payer: Self-pay | Admitting: Family Medicine

## 2020-11-10 ENCOUNTER — Encounter: Payer: Self-pay | Admitting: Family Medicine

## 2020-11-10 ENCOUNTER — Ambulatory Visit (INDEPENDENT_AMBULATORY_CARE_PROVIDER_SITE_OTHER): Payer: BC Managed Care – PPO | Admitting: Family Medicine

## 2020-11-10 ENCOUNTER — Other Ambulatory Visit: Payer: Self-pay

## 2020-11-10 VITALS — BP 138/88 | HR 73 | Temp 97.9°F | Resp 18 | Ht 65.0 in | Wt 200.0 lb

## 2020-11-10 DIAGNOSIS — E559 Vitamin D deficiency, unspecified: Secondary | ICD-10-CM

## 2020-11-10 DIAGNOSIS — Z0000 Encounter for general adult medical examination without abnormal findings: Secondary | ICD-10-CM

## 2020-11-10 DIAGNOSIS — I10 Essential (primary) hypertension: Secondary | ICD-10-CM

## 2020-11-10 DIAGNOSIS — E785 Hyperlipidemia, unspecified: Secondary | ICD-10-CM

## 2020-11-10 DIAGNOSIS — H6123 Impacted cerumen, bilateral: Secondary | ICD-10-CM

## 2020-11-10 DIAGNOSIS — R7989 Other specified abnormal findings of blood chemistry: Secondary | ICD-10-CM

## 2020-11-10 NOTE — Assessment & Plan Note (Signed)
Annual exam as documented. Counseling done  re healthy lifestyle involving commitment to 150 minutes exercise per week, heart healthy diet, and attaining healthy weight.The importance of adequate sleep also discussed. ls and time frames  set for achieving them. Immunization and cancer screening needs are specifically addressed at this visit.

## 2020-11-10 NOTE — Progress Notes (Signed)
   Devon Macdonald.     MRN: [id number]      DOB: [birth year]   HPI: Patient is in for annual physical exam. Bilateral cerumen impaction is also addressed at the visit. Labs are reviewed after visit, drawn of day of visit, new concern of  diabetes, still awaiting final result Immunization is reviewed , and  is up to date    PE; BP 138/88 (BP Location: Right Arm, Patient Position: Sitting, Cuff Size: Large)   Pulse 73   Temp 97.9 F (36.6 C)   Resp 18   Ht 5\' 5"  (1.651 m)   Wt 200 lb (90.7 kg)   SpO2 96%   BMI 33.28 kg/m   Pleasant male, alert and oriented x 3, in no cardio-pulmonary distress. Afebrile. HEENT No facial trauma or asymetry. Sinuses non tender. EOMI External ears normal, Bilateral cerumen impaction Neck: supple, no adenopathy,JVD or thyromegaly.No bruits.  Chest: Clear to ascultation bilaterally.No crackles or wheezes. Non tender to palpation  Cardiovascular system; Heart sounds normal,  S1 and  S2 ,no S3.  No murmur, or thrill. Apical beat not displaced Peripheral pulses normal.  Abdomen: Soft, non tender, no organomegaly or masses. No bruits. Bowel sounds normal. No guarding, tenderness or rebound.    Musculoskeletal exam: Full ROM of spine, hips , shoulders and knees. No deformity ,swelling or crepitus noted. No muscle wasting or atrophy.   Neurologic: Cranial nerves 2 to 12 intact. Power, tone ,sensation and reflexes normal throughout. No disturbance in gait. No tremor.  Skin: Intact, no ulceration, erythema , scaling or rash noted. Pigmentation normal throughout  Psych; Normal mood and affect. Judgement and concentration normal   Assessment & Plan:  Annual physical exam Annual exam as documented. Counseling done  re healthy lifestyle involving commitment to 150 minutes exercise per week, heart healthy diet, and attaining healthy weight.The importance of adequate sleep also discussed. ls and time frames  set for achieving  them. Immunization and cancer screening needs are specifically addressed at this visit.   Cerumen impaction successful ear flush by nurse, no adverse effects, tolerated well

## 2020-11-10 NOTE — Patient Instructions (Signed)
F/u in 6 months re evaluate blood pressure and weight, call if you need me sooner  Nurse please document dates of flu vaccine and covid booster per pt  Bilateral ear irrigation by nurse today  cBc, lipid, cmp and EGFr, tSH, vit D in office today and hBA1C  It is important that you exercise regularly at least 30 minutes 5 times a week. If you develop chest pain, have severe difficulty breathing, or feel very tired, stop exercising immediately and seek medical attention   Think about what you will eat, plan ahead. Choose " clean, green, fresh or frozen" over canned, processed or packaged foods which are more sugary, salty and fatty. 70 to 75% of food eaten should be vegetables and fruit. Three meals at set times with snacks allowed between meals, but they must be fruit or vegetables. Aim to eat over a 12 hour period , example 7 am to 7 pm, and STOP after  your last meal of the day. Drink water,generally about 64 ounces per day, no other drink is as healthy. Fruit juice is best enjoyed in a healthy way, by EATING the fruit. Thanks for choosing [facility], we consider it a privelige to serve you.

## 2020-11-11 LAB — CMP14+EGFR
ALT: 93 IU/L — ABNORMAL HIGH (ref 0–44)
AST: 41 IU/L — ABNORMAL HIGH (ref 0–40)
Albumin/Globulin Ratio: 1.7 (ref 1.2–2.2)
Albumin: 5 g/dL (ref 4.0–5.0)
Alkaline Phosphatase: 126 IU/L — ABNORMAL HIGH (ref 44–121)
BUN/Creatinine Ratio: 8 — ABNORMAL LOW (ref 9–20)
BUN: 9 mg/dL (ref 6–20)
Bilirubin Total: 0.7 mg/dL (ref 0.0–1.2)
CO2: 27 mmol/L (ref 20–29)
Calcium: 10.2 mg/dL (ref 8.7–10.2)
Chloride: 91 mmol/L — ABNORMAL LOW (ref 96–106)
Creatinine, Ser: 1.07 mg/dL (ref 0.76–1.27)
Globulin, Total: 2.9 g/dL (ref 1.5–4.5)
Glucose: 268 mg/dL — ABNORMAL HIGH (ref 65–99)
Potassium: 3.6 mmol/L (ref 3.5–5.2)
Sodium: 137 mmol/L (ref 134–144)
Total Protein: 7.9 g/dL (ref 6.0–8.5)
eGFR: 91 mL/min/{1.73_m2} (ref >59)

## 2020-11-11 LAB — LIPID PANEL
Chol/HDL Ratio: 4 ratio (ref 0.0–5.0)
Cholesterol, Total: 159 mg/dL (ref 100–199)
HDL: 40 mg/dL (ref >39)
LDL Chol Calc (NIH): 82 mg/dL (ref 0–99)
Triglycerides: 220 mg/dL — ABNORMAL HIGH (ref 0–149)
VLDL Cholesterol Cal: 37 mg/dL (ref 5–40)

## 2020-11-11 LAB — CBC WITH DIFFERENTIAL/PLATELET
Basophils Absolute: 0.1 10*3/uL (ref 0.0–0.2)
Basos: 1 %
EOS (ABSOLUTE): 0.1 10*3/uL (ref 0.0–0.4)
Eos: 1 %
Hematocrit: 48 % (ref 37.5–51.0)
Hemoglobin: 16.1 g/dL (ref 13.0–17.7)
Immature Grans (Abs): 0 10*3/uL (ref 0.0–0.1)
Immature Granulocytes: 0 %
Lymphocytes Absolute: 3.7 10*3/uL — ABNORMAL HIGH (ref 0.7–3.1)
Lymphs: 42 %
MCH: 28 pg (ref 26.6–33.0)
MCHC: 33.5 g/dL (ref 31.5–35.7)
MCV: 83 fL (ref 79–97)
Monocytes Absolute: 0.6 10*3/uL (ref 0.1–0.9)
Monocytes: 6 %
Neutrophils Absolute: 4.4 10*3/uL (ref 1.4–7.0)
Neutrophils: 50 %
Platelets: 294 10*3/uL (ref 150–450)
RBC: 5.76 x10E6/uL (ref 4.14–5.80)
RDW: 12.2 % (ref 11.6–15.4)
WBC: 8.9 10*3/uL (ref 3.4–10.8)

## 2020-11-11 LAB — TSH: TSH: 2.04 u[IU]/mL (ref 0.450–4.500)

## 2020-11-11 LAB — VITAMIN D 25 HYDROXY (VIT D DEFICIENCY, FRACTURES): Vit D, 25-Hydroxy: 25.9 ng/mL — ABNORMAL LOW (ref 30.0–100.0)

## 2020-11-13 ENCOUNTER — Encounter: Payer: Self-pay | Admitting: Family Medicine

## 2020-11-13 DIAGNOSIS — H6120 Impacted cerumen, unspecified ear: Secondary | ICD-10-CM | POA: Insufficient documentation

## 2020-11-13 NOTE — Assessment & Plan Note (Signed)
successful ear flush by nurse, no adverse effects, tolerated well

## 2020-11-14 ENCOUNTER — Other Ambulatory Visit: Payer: Self-pay | Admitting: *Deleted

## 2020-11-14 ENCOUNTER — Encounter: Payer: Self-pay | Admitting: Family Medicine

## 2020-11-14 DIAGNOSIS — R7989 Other specified abnormal findings of blood chemistry: Secondary | ICD-10-CM

## 2020-11-14 NOTE — Telephone Encounter (Signed)
Pt advised of lab results and recommendations with verbal understanding

## 2020-11-15 ENCOUNTER — Telehealth: Payer: Self-pay | Admitting: Family Medicine

## 2020-11-15 ENCOUNTER — Encounter: Payer: Self-pay | Admitting: Family Medicine

## 2020-11-15 ENCOUNTER — Other Ambulatory Visit: Payer: Self-pay | Admitting: Family Medicine

## 2020-11-15 DIAGNOSIS — E1165 Type 2 diabetes mellitus with hyperglycemia: Secondary | ICD-10-CM

## 2020-11-15 LAB — HEPATIC FUNCTION PANEL
ALT: 89 IU/L — ABNORMAL HIGH (ref 0–44)
AST: 43 IU/L — ABNORMAL HIGH (ref 0–40)
Albumin: 5 g/dL (ref 4.0–5.0)
Alkaline Phosphatase: 128 IU/L — ABNORMAL HIGH (ref 44–121)
Bilirubin Total: 0.4 mg/dL (ref 0.0–1.2)
Bilirubin, Direct: 0.15 mg/dL (ref 0.00–0.40)
Total Protein: 7.9 g/dL (ref 6.0–8.5)

## 2020-11-15 LAB — SPECIMEN STATUS REPORT

## 2020-11-15 LAB — HEMOGLOBIN A1C
Est. average glucose Bld gHb Est-mCnc: 275 mg/dL
Hgb A1c MFr Bld: 11.2 % — ABNORMAL HIGH (ref 4.8–5.6)

## 2020-11-15 MED ORDER — SITAGLIP PHOS-METFORMIN HCL ER 50-1000 MG PO TB24: ORAL_TABLET | ORAL | 2 refills | Status: DC

## 2020-11-15 NOTE — Telephone Encounter (Signed)
Pls see recent message, he is a new diabetic , I am starting meds, may need coupon for the janumet , pls folow through. Needs meter with twice daily testing due to unstable , markedly elevated blood sugar, needs referal to diabetic ed, please speak directly wih him  ?? Please ask. I had tried urgent endo but if tis works, still have no eno appt needs to keepf/u re the diabetes and send in daily bloo sugar results to me Pls send  feedback message to me, thanks!

## 2020-11-15 NOTE — Progress Notes (Signed)
hBA1C  over 11 , refer endo urgent

## 2020-11-16 ENCOUNTER — Telehealth: Payer: Self-pay

## 2020-11-16 ENCOUNTER — Other Ambulatory Visit: Payer: Self-pay

## 2020-11-16 MED ORDER — SITAGLIP PHOS-METFORMIN HCL ER 50-1000 MG PO TB24: ORAL_TABLET | ORAL | 2 refills | Status: DC

## 2020-11-16 MED ORDER — LANCETS 30G MISC: 5 refills | Status: DC

## 2020-11-16 MED ORDER — BLOOD GLUCOSE METER KIT: PACK | 0 refills | Status: DC

## 2020-11-16 MED ORDER — GLUCOSE BLOOD VI STRP: ORAL_STRIP | 5 refills | Status: DC

## 2020-11-16 NOTE — Telephone Encounter (Signed)
Both sent too new pharmacy

## 2020-11-16 NOTE — Telephone Encounter (Signed)
Patient called his pharmacy has closed. Need to change his pharmacy to  [facility] [address] [place], [place]   SitaGLIPtin-MetFORMIN HCl 50-1000 MG TB24   Needs to get glucose sent to pharmacy also.

## 2020-11-16 NOTE — Telephone Encounter (Signed)
Called patient and left message for them to return call at the office   

## 2020-11-17 ENCOUNTER — Other Ambulatory Visit: Payer: Self-pay

## 2020-11-17 MED ORDER — FREESTYLE LIBRE 2 SENSOR MISC: 5 refills | Status: DC

## 2020-11-17 MED ORDER — FREESTYLE LIBRE 2 READER DEVI: Status: DC

## 2020-11-17 MED ORDER — FREESTYLE LIBRE 2 READER DEVI: 3 refills | Status: DC

## 2020-11-17 NOTE — Telephone Encounter (Signed)
Got the Janumet- started this am. Would not agree to prick his fingers twice daily even temporarily because he uses his hands at work all day and would only use the [place]. This was sent in to see is affordable. And will msg me back on mychart to let me know if/when he gets it and what his readings are fasting am and pm

## 2020-11-25 ENCOUNTER — Other Ambulatory Visit: Payer: Self-pay | Admitting: Family Medicine

## 2020-11-25 MED ORDER — EMPAGLIFLOZIN 10 MG PO TABS: 10.0000 mg | ORAL_TABLET | Freq: Every day | ORAL | 3 refills | Status: DC

## 2020-12-09 ENCOUNTER — Telehealth: Payer: Self-pay

## 2020-12-09 NOTE — Telephone Encounter (Signed)
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  Can something be called into pharmacy.

## 2020-12-09 NOTE — Telephone Encounter (Signed)
Pt states that his symptoms are coming from Jaurdiance, and he will make do with cranberry pills.

## 2020-12-09 NOTE — Telephone Encounter (Signed)
He has an appt next week but its by phone. 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 I would advise local urgent care but he states you have told him otherwise

## 2020-12-15 ENCOUNTER — Encounter: Payer: Self-pay | Admitting: Family Medicine

## 2020-12-15 ENCOUNTER — Ambulatory Visit (INDEPENDENT_AMBULATORY_CARE_PROVIDER_SITE_OTHER): Payer: BC Managed Care – PPO | Admitting: Family Medicine

## 2020-12-15 ENCOUNTER — Other Ambulatory Visit: Payer: Self-pay

## 2020-12-15 DIAGNOSIS — E1169 Type 2 diabetes mellitus with other specified complication: Secondary | ICD-10-CM

## 2020-12-15 DIAGNOSIS — J0100 Acute maxillary sinusitis, unspecified: Secondary | ICD-10-CM

## 2020-12-15 DIAGNOSIS — J209 Acute bronchitis, unspecified: Secondary | ICD-10-CM

## 2020-12-15 DIAGNOSIS — E1165 Type 2 diabetes mellitus with hyperglycemia: Secondary | ICD-10-CM

## 2020-12-15 DIAGNOSIS — I10 Essential (primary) hypertension: Secondary | ICD-10-CM | POA: Diagnosis not present

## 2020-12-15 DIAGNOSIS — E669 Obesity, unspecified: Secondary | ICD-10-CM

## 2020-12-15 MED ORDER — FREESTYLE LIBRE 2 SENSOR MISC: 5 refills | Status: DC

## 2020-12-15 MED ORDER — ROSUVASTATIN CALCIUM 5 MG PO TABS: 5.0000 mg | ORAL_TABLET | Freq: Every day | ORAL | 3 refills | Status: DC

## 2020-12-15 MED ORDER — AZITHROMYCIN 250 MG PO TABS
ORAL_TABLET | ORAL | 0 refills | Status: AC
Start: 2020-12-15 — End: 2020-12-20

## 2020-12-15 MED ORDER — BENZONATATE 100 MG PO CAPS: 100.0000 mg | ORAL_CAPSULE | Freq: Two times a day (BID) | ORAL | 0 refills | Status: DC | PRN

## 2020-12-15 NOTE — Progress Notes (Signed)
Virtual Visit via Telephone Note  I connected with Devon Macdonald. on 12/15/20 at  2:20 PM EDT by telephone and verified that I am speaking with the correct person using two identifiers.  Location: Patient: Devon Macdonald Provider: office   I discussed the limitations, risks, security and privacy concerns of performing an evaluation and management service by telephone and the availability of in person appointments. I also discussed with the patient that there may be a patient responsible charge related to this service. The patient expressed understanding and agreed to proceed.   History of Present Illness: 3 day h/o chest and head congestion, started 12/13/2020, nasal drainage and sputum are green, no fever, positive chills Denies polyuria, polydipsia, blurred vision , or hypoglycemic episodes. Reports fasting sugar ranges from mid 80's to 130, bedtime between 120 to 180 Currently tolerating medication    Observations/Objective: There were no vitals taken for this visit. Good communication with no confusion and intact memory. Alert and oriented x 3 No signs of respiratory distress during speech   Assessment and Plan: Type 2 diabetes mellitus with hyperglycemia [facility]) [name] is reminded of the importance of commitment to daily physical activity for 30 minutes or more, as able and the need to limit carbohydrate intake to 30 to 60 grams per meal to help with blood sugar control.   The need to take medication as prescribed, test blood sugar as directed, and to call between visits if there is a concern that blood sugar is uncontrolled is also discussed.   [name] is reminded of the importance of daily foot exam, annual eye examination, and good blood sugar, blood pressure and cholesterol control.  Diabetic Labs Latest Ref Rng & Units 11/10/2020 10/28/2019 08/09/2017 02/15/2015 12/17/2013  HbA1c 4.8 - 5.6 % 11.2(H) - - - -  Chol 100 - 199 mg/dL 161 096(E) 454(U) 981(X) 200  HDL >39  mg/dL 40 47 48 79 74  Calc LDL 0 - 99 mg/dL 82 914(N) 829(F) 621 308(M)  Triglycerides 0 - 149 mg/dL 578(I) 696(E) 952 81 59  Creatinine 0.76 - 1.27 mg/dL 8.41 3.24 4.01 0.27 2.53   BP/Weight 11/10/2020 04/08/2020 11/10/2019 11/03/2019 10/28/2019 10/20/2019 10/06/2019  Systolic BP 138 135 118 139 118 - 120  Diastolic BP 88 86 78 96 81 - 75  Wt. (Lbs) 200 205 210 210 206.04 185 185  BMI 33.28 34.11 34.95 34.95 34.29 30.79 30.79   No flowsheet data found.   Improved and controlled on curent medication per pt reporting, will eval HBa1C in 01/2021 when due. Needs to continue twice daily testing due to marked hyperglycemia      Obesity (BMI 30.0-34.9)  Patient re-educated about  the importance of commitment to a  minimum of 150 minutes of exercise per week as able.  The importance of healthy food choices with portion control discussed, as well as eating regularly and within a 12 hour window most days. The need to choose "clean , green" food 50 to 75% of the time is discussed, as well as to make water the primary drink and set a goal of 64 ounces water daily.    Weight /BMI 11/10/2020 04/08/2020 11/10/2019  WEIGHT 200 lb 205 lb 210 lb  HEIGHT 5\' 5"  5\' 5"  5\' 5"   BMI 33.28 kg/m2 34.11 kg/m2 34.95 kg/m2      HTN (hypertension) DASH diet and commitment to daily physical activity for a minimum of 30 minutes discussed and encouraged, as a part of hypertension management. The importance  of attaining a healthy weight is also discussed.  BP/Weight 11/10/2020 04/08/2020 11/10/2019 11/03/2019 10/28/2019 10/20/2019 10/06/2019  Systolic BP 138 135 118 139 118 - 120  Diastolic BP 88 86 78 96 81 - 75  Wt. (Lbs) 200 205 210 210 206.04 185 185  BMI 33.28 34.11 34.95 34.95 34.29 30.79 30.79       Acute sinus infection Z pack prescribed  Acute bronchitis Tessalon perle and z pack prescribed   Follow Up Instructions:    I discussed the assessment and treatment plan with the patient. The patient was  provided an opportunity to ask questions and all were answered. The patient agreed with the plan and demonstrated an understanding of the instructions.   The patient was advised to call back or seek an in-person evaluation if the symptoms worsen or if the condition fails to improve as anticipated.  I provided 22 minutes of non-face-to-face time during this encounter.   [name], MD

## 2020-12-15 NOTE — Patient Instructions (Addendum)
F/U first week in January, call if you need me sooner  Azithromycin and tessalon perles are prescribed for head and chest congestion  Nurse please try to justify twice daily testing till end of year, new diabetic with marked hyperglycemia, if OK then pt  requests new script for more frequent testing as equipment is expensive  Non fasting HBA1C, c chem 7 and EGFR, microalb 12 /17 or after  Need to start crestor ( statin) daily since you are diabetic to reduce risk of heart disease , this is prescribed  Thanks for choosing [facility], we consider it a privelige to serve you.

## 2020-12-18 ENCOUNTER — Encounter: Payer: Self-pay | Admitting: Family Medicine

## 2020-12-18 DIAGNOSIS — J019 Acute sinusitis, unspecified: Secondary | ICD-10-CM | POA: Insufficient documentation

## 2020-12-18 DIAGNOSIS — E1165 Type 2 diabetes mellitus with hyperglycemia: Secondary | ICD-10-CM | POA: Insufficient documentation

## 2020-12-18 NOTE — Assessment & Plan Note (Signed)
  Patient re-educated about  the importance of commitment to a  minimum of 150 minutes of exercise per week as able.  The importance of healthy food choices with portion control discussed, as well as eating regularly and within a 12 hour window most days. The need to choose "clean , green" food 50 to 75% of the time is discussed, as well as to make water the primary drink and set a goal of 64 ounces water daily.    Weight /BMI 11/10/2020 04/08/2020 11/10/2019  WEIGHT 200 lb 205 lb 210 lb  HEIGHT 5\' 5"  5\' 5"  5\' 5"   BMI 33.28 kg/m2 34.11 kg/m2 34.95 kg/m2

## 2020-12-18 NOTE — Assessment & Plan Note (Signed)
Tessalon perle and z pack prescribed 

## 2020-12-18 NOTE — Assessment & Plan Note (Addendum)
Mr. Mackins is reminded of the importance of commitment to daily physical activity for 30 minutes or more, as able and the need to limit carbohydrate intake to 30 to 60 grams per meal to help with blood sugar control.   The need to take medication as prescribed, test blood sugar as directed, and to call between visits if there is a concern that blood sugar is uncontrolled is also discussed.   Mr. Yasuda is reminded of the importance of daily foot exam, annual eye examination, and good blood sugar, blood pressure and cholesterol control.  Diabetic Labs Latest Ref Rng & Units 11/10/2020 10/28/2019 08/09/2017 02/15/2015 12/17/2013  HbA1c 4.8 - 5.6 % 11.2(H) - - - -  Chol 100 - 199 mg/dL 921 194(R) 740(C) 144(Y) 200  HDL >39 mg/dL 40 47 48 79 74  Calc LDL 0 - 99 mg/dL 82 185(U) 314(H) 702 637(C)  Triglycerides 0 - 149 mg/dL 588(F) 027(X) 412 81 59  Creatinine 0.76 - 1.27 mg/dL 8.78 6.76 7.20 9.47 0.96   BP/Weight 11/10/2020 04/08/2020 11/10/2019 11/03/2019 10/28/2019 10/20/2019 10/06/2019  Systolic BP 138 135 118 139 118 - 120  Diastolic BP 88 86 78 96 81 - 75  Wt. (Lbs) 200 205 210 210 206.04 185 185  BMI 33.28 34.11 34.95 34.95 34.29 30.79 30.79   No flowsheet data found.   Improved and controlled on curent medication per pt reporting, will eval HBa1C in 01/2021 when due. Needs to continue twice daily testing due to marked hyperglycemia

## 2020-12-18 NOTE — Assessment & Plan Note (Signed)
DASH diet and commitment to daily physical activity for a minimum of 30 minutes discussed and encouraged, as a part of hypertension management. The importance of attaining a healthy weight is also discussed.  BP/Weight 11/10/2020 04/08/2020 11/10/2019 11/03/2019 10/28/2019 10/20/2019 10/06/2019  Systolic BP 138 135 118 139 118 - 120  Diastolic BP 88 86 78 96 81 - 75  Wt. (Lbs) 200 205 210 210 206.04 185 185  BMI 33.28 34.11 34.95 34.95 34.29 30.79 30.79

## 2020-12-18 NOTE — Assessment & Plan Note (Signed)
Z pack prescribed 

## 2020-12-21 ENCOUNTER — Other Ambulatory Visit: Payer: Self-pay | Admitting: Family Medicine

## 2020-12-21 MED ORDER — BENAZEPRIL HCL 5 MG PO TABS: 5.0000 mg | ORAL_TABLET | Freq: Every day | ORAL | 1 refills | Status: DC

## 2021-02-13 ENCOUNTER — Other Ambulatory Visit: Payer: Self-pay | Admitting: Family Medicine

## 2021-02-18 ENCOUNTER — Encounter: Payer: Self-pay | Admitting: Family Medicine

## 2021-02-22 ENCOUNTER — Telehealth: Payer: BC Managed Care – PPO | Admitting: Family Medicine

## 2021-03-01 ENCOUNTER — Telehealth: Payer: BC Managed Care – PPO | Admitting: Family Medicine

## 2021-03-02 ENCOUNTER — Ambulatory Visit: Payer: BC Managed Care – PPO | Admitting: Family Medicine

## 2021-03-02 ENCOUNTER — Encounter: Payer: Self-pay | Admitting: Family Medicine

## 2021-03-02 ENCOUNTER — Other Ambulatory Visit: Payer: Self-pay

## 2021-03-02 DIAGNOSIS — E669 Obesity, unspecified: Secondary | ICD-10-CM

## 2021-03-02 DIAGNOSIS — I10 Essential (primary) hypertension: Secondary | ICD-10-CM | POA: Diagnosis not present

## 2021-03-02 DIAGNOSIS — R051 Acute cough: Secondary | ICD-10-CM

## 2021-03-02 DIAGNOSIS — E1165 Type 2 diabetes mellitus with hyperglycemia: Secondary | ICD-10-CM

## 2021-03-02 DIAGNOSIS — E785 Hyperlipidemia, unspecified: Secondary | ICD-10-CM | POA: Diagnosis not present

## 2021-03-02 DIAGNOSIS — E66811 Obesity, class 1: Secondary | ICD-10-CM

## 2021-03-02 MED ORDER — BENZONATATE 100 MG PO CAPS: 100.0000 mg | ORAL_CAPSULE | Freq: Two times a day (BID) | ORAL | 0 refills | Status: DC | PRN

## 2021-03-02 NOTE — Patient Instructions (Signed)
F/U in 4 . Months, call if you need me sooner  NEED lipid, cmp and eGFR and hBA1C within the next 1 week, Nurse pls call pt to help hoim find nearby lab, I asked him to ask siri in the interim so it should not be an issue, and fax lab order to the [facility] at that specific location also please  Nurse please call and see if anything available to place over [place] site to protect, pt reports hitting arm accidentally multiple times and it being painful, would prefer it waterproof! As he intends to start swimming

## 2021-03-02 NOTE — Progress Notes (Signed)
Virtual Visit via Telephone Note  I connected with Devon Macdonald. on 03/02/21 at  2:40 PM EST by telephone and verified that I am speaking with the correct person using two identifiers.  Location: Patient: home Provider: office   I discussed the limitations, risks, security and privacy concerns of performing an evaluation and management service by telephone and the availability of in person appointments. I also discussed with the patient that there may be a patient responsible charge related to this service. The patient expressed understanding and agreed to proceed.   History of Present Illness:. F/U chronic problems and address any new or current concerns. Review and update medications and allergies. Review recent lab and radiologic data . Update routine health maintainace. Review an encourage improved health habits to include nutrition, exercise and  sleep . Reports very good blood sugars with fasting sugar seldom over 120 Requests script for cover for skin overlying his blood sugar monitoring device as reports multipls bumps which are painful, also wants waterproof as he intends to start swimming Was out earlier this week with runny nose and cough, both have improved, never had fever , chills or bodyaches, covid test is negative, needs decongestant    Observations/Objective: There were no vitals taken for this visit. Good communication alert and oriented, intermittent cough during interview  Assessment and Plan:  HTN (hypertension) DASH diet and commitment to daily physical activity for a minimum of 30 minutes discussed and encouraged, as a part of hypertension management. The importance of attaining a healthy weight is also discussed.  BP/Weight 11/10/2020 04/08/2020 11/10/2019 11/03/2019 10/28/2019 10/20/2019 10/06/2019  Systolic BP 138 135 118 139 118 - 120  Diastolic BP 88 86 78 96 81 - 75  Wt. (Lbs) 200 205 210 210 206.04 185 185  BMI 33.28 34.11 34.95 34.95 34.29 30.79  30.79       Type 2 diabetes mellitus with hyperglycemia [facility]) Mr. Ackert is reminded of the importance of commitment to daily physical activity for 30 minutes or more, as able and the need to limit carbohydrate intake to 30 to 60 grams per meal to help with blood sugar control.   The need to take medication as prescribed, test blood sugar as directed, and to call between visits if there is a concern that blood sugar is uncontrolled is also discussed.   Mr. Vanderburg is reminded of the importance of daily foot exam, annual eye examination, and good blood sugar, blood pressure and cholesterol control. Need updated lab Diabetic Labs Latest Ref Rng & Units 11/10/2020 10/28/2019 08/09/2017 02/15/2015 12/17/2013  HbA1c 4.8 - 5.6 % 11.2(H) - - - -  Chol 100 - 199 mg/dL 400 867(Y) 195(K) 932(I) 200  HDL >39 mg/dL 40 47 48 79 74  Calc LDL 0 - 99 mg/dL 82 712(W) 580(D) 983 382(N)  Triglycerides 0 - 149 mg/dL 053(Z) 767(H) 419 81 59  Creatinine 0.76 - 1.27 mg/dL 3.79 0.24 0.97 3.53 2.99   BP/Weight 11/10/2020 04/08/2020 11/10/2019 11/03/2019 10/28/2019 10/20/2019 10/06/2019  Systolic BP 138 135 118 139 118 - 120  Diastolic BP 88 86 78 96 81 - 75  Wt. (Lbs) 200 205 210 210 206.04 185 185  BMI 33.28 34.11 34.95 34.95 34.29 30.79 30.79   No flowsheet data found.      Hyperlipemia Hyperlipidemia:Low fat diet discussed and encouraged.   Lipid Panel  Lab Results  Component Value Date   CHOL 159 11/10/2020   HDL 40 11/10/2020   LDLCALC 82 11/10/2020  TRIG 220 (H) 11/10/2020   CHOLHDL 4.0 11/10/2020     Need updated lab  Obesity (BMI 30.0-34.9)  Patient re-educated about  the importance of commitment to a  minimum of 150 minutes of exercise per week as able.  The importance of healthy food choices with portion control discussed, as well as eating regularly and within a 12 hour window most days. The need to choose "clean , green" food 50 to 75% of the time is discussed, as well as to make  water the primary drink and set a goal of 64 ounces water daily.    Weight /BMI 11/10/2020 04/08/2020 11/10/2019  WEIGHT 200 lb 205 lb 210 lb  HEIGHT 5\' 5"  5\' 5"  5\' 5"   BMI 33.28 kg/m2 34.11 kg/m2 34.95 kg/m2      Cough Short course of tessalon perles as needed  Follow Up Instructions:    I discussed the assessment and treatment plan with the patient. The patient was provided an opportunity to ask questions and all were answered. The patient agreed with the plan and demonstrated an understanding of the instructions.   The patient was advised to call back or seek an in-person evaluation if the symptoms worsen or if the condition fails to improve as anticipated.  I provided 15 minutes of non-face-to-face time during this encounter.   , MD

## 2021-03-07 ENCOUNTER — Encounter: Payer: Self-pay | Admitting: Family Medicine

## 2021-03-07 NOTE — Assessment & Plan Note (Signed)
Short course of tessalon perles as needed

## 2021-03-07 NOTE — Assessment & Plan Note (Signed)
°  Patient re-educated about  the importance of commitment to a  minimum of 150 minutes of exercise per week as able.  The importance of healthy food choices with portion control discussed, as well as eating regularly and within a 12 hour window most days. The need to choose "clean , green" food 50 to 75% of the time is discussed, as well as to make water the primary drink and set a goal of 64 ounces water daily.    Weight /BMI 11/10/2020 04/08/2020 11/10/2019  WEIGHT 200 lb 205 lb 210 lb  HEIGHT 5' 5" 5' 5" 5' 5"  BMI 33.28 kg/m2 34.11 kg/m2 34.95 kg/m2     

## 2021-03-07 NOTE — Assessment & Plan Note (Signed)
Hyperlipidemia:Low fat diet discussed and encouraged.   Lipid Panel  Lab Results  Component Value Date   CHOL 159 11/10/2020   HDL 40 11/10/2020   LDLCALC 82 11/10/2020   TRIG 220 (H) 11/10/2020   CHOLHDL 4.0 11/10/2020     Need updated lab

## 2021-03-07 NOTE — Assessment & Plan Note (Signed)
Devon Macdonald is reminded of the importance of commitment to daily physical activity for 30 minutes or more, as able and the need to limit carbohydrate intake to 30 to 60 grams per meal to help with blood sugar control.   The need to take medication as prescribed, test blood sugar as directed, and to call between visits if there is a concern that blood sugar is uncontrolled is also discussed.   Devon Macdonald is reminded of the importance of daily foot exam, annual eye examination, and good blood sugar, blood pressure and cholesterol control. Need updated lab Diabetic Labs Latest Ref Rng & Units 11/10/2020 10/28/2019 08/09/2017 02/15/2015 12/17/2013  HbA1c 4.8 - 5.6 % 11.2(H) - - - -  Chol 100 - 199 mg/dL 354 656(C) 127(N) 170(Y) 200  HDL >39 mg/dL 40 47 48 79 74  Calc LDL 0 - 99 mg/dL 82 174(B) 449(Q) 759 163(W)  Triglycerides 0 - 149 mg/dL 466(Z) 993(T) 701 81 59  Creatinine 0.76 - 1.27 mg/dL 7.79 3.90 3.00 9.23 3.00   BP/Weight 11/10/2020 04/08/2020 11/10/2019 11/03/2019 10/28/2019 10/20/2019 10/06/2019  Systolic BP 138 135 118 139 118 - 120  Diastolic BP 88 86 78 96 81 - 75  Wt. (Lbs) 200 205 210 210 206.04 185 185  BMI 33.28 34.11 34.95 34.95 34.29 30.79 30.79   No flowsheet data found.

## 2021-03-07 NOTE — Assessment & Plan Note (Signed)
DASH diet and commitment to daily physical activity for a minimum of 30 minutes discussed and encouraged, as a part of hypertension management. The importance of attaining a healthy weight is also discussed.  BP/Weight 11/10/2020 04/08/2020 11/10/2019 11/03/2019 10/28/2019 10/20/2019 10/06/2019  Systolic BP 138 135 118 139 118 - 120  Diastolic BP 88 86 78 96 81 - 75  Wt. (Lbs) 200 205 210 210 206.04 185 185  BMI 33.28 34.11 34.95 34.95 34.29 30.79 30.79      

## 2021-03-09 ENCOUNTER — Other Ambulatory Visit: Payer: Self-pay

## 2021-03-09 DIAGNOSIS — I10 Essential (primary) hypertension: Secondary | ICD-10-CM

## 2021-03-09 DIAGNOSIS — E785 Hyperlipidemia, unspecified: Secondary | ICD-10-CM

## 2021-03-10 ENCOUNTER — Other Ambulatory Visit: Payer: Self-pay

## 2021-03-10 DIAGNOSIS — R7989 Other specified abnormal findings of blood chemistry: Secondary | ICD-10-CM

## 2021-03-10 LAB — BMP8+EGFR
BUN/Creatinine Ratio: 8 — ABNORMAL LOW (ref 9–20)
BUN: 9 mg/dL (ref 6–20)
CO2: 24 mmol/L (ref 20–29)
Calcium: 11 mg/dL — ABNORMAL HIGH (ref 8.7–10.2)
Chloride: 91 mmol/L — ABNORMAL LOW (ref 96–106)
Creatinine, Ser: 1.12 mg/dL (ref 0.76–1.27)
Glucose: 96 mg/dL (ref 70–99)
Potassium: 4.5 mmol/L (ref 3.5–5.2)
Sodium: 134 mmol/L (ref 134–144)
eGFR: 86 mL/min/{1.73_m2} (ref >59)

## 2021-03-10 LAB — LIPID PANEL
Chol/HDL Ratio: 5.5 ratio — ABNORMAL HIGH (ref 0.0–5.0)
Cholesterol, Total: 240 mg/dL — ABNORMAL HIGH (ref 100–199)
HDL: 44 mg/dL (ref >39)
LDL Chol Calc (NIH): 151 mg/dL — ABNORMAL HIGH (ref 0–99)
Triglycerides: 245 mg/dL — ABNORMAL HIGH (ref 0–149)
VLDL Cholesterol Cal: 45 mg/dL — ABNORMAL HIGH (ref 5–40)

## 2021-03-10 LAB — HEPATIC FUNCTION PANEL
ALT: 65 IU/L — ABNORMAL HIGH (ref 0–44)
AST: 36 IU/L (ref 0–40)
Albumin: 5.2 g/dL — ABNORMAL HIGH (ref 4.0–5.0)
Alkaline Phosphatase: 91 IU/L (ref 44–121)
Bilirubin Total: 0.4 mg/dL (ref 0.0–1.2)
Bilirubin, Direct: 0.13 mg/dL (ref 0.00–0.40)
Total Protein: 8.2 g/dL (ref 6.0–8.5)

## 2021-03-10 LAB — MICROALBUMIN / CREATININE URINE RATIO
Creatinine, Urine: 113.1 mg/dL
Microalb/Creat Ratio: 120 mg/g creat — ABNORMAL HIGH (ref 0–29)
Microalbumin, Urine: 135.8 ug/mL

## 2021-03-10 LAB — HEMOGLOBIN A1C
Est. average glucose Bld gHb Est-mCnc: 126 mg/dL
Hgb A1c MFr Bld: 6 % — ABNORMAL HIGH (ref 4.8–5.6)

## 2021-03-10 LAB — SPECIMEN STATUS REPORT

## 2021-03-10 MED ORDER — FREESTYLE LIBRE 2 SENSOR MISC: 5 refills | Status: DC

## 2021-03-22 ENCOUNTER — Encounter: Payer: Self-pay | Admitting: Family Medicine

## 2021-03-28 ENCOUNTER — Other Ambulatory Visit: Payer: Self-pay | Admitting: Family Medicine

## 2021-03-30 ENCOUNTER — Other Ambulatory Visit: Payer: Self-pay

## 2021-03-30 DIAGNOSIS — I10 Essential (primary) hypertension: Secondary | ICD-10-CM

## 2021-03-30 MED ORDER — TRIAMTERENE-HCTZ 75-50 MG PO TABS: 1.0000 | ORAL_TABLET | Freq: Every day | ORAL | 1 refills | Status: DC

## 2021-03-30 MED ORDER — BENAZEPRIL HCL 5 MG PO TABS: 5.0000 mg | ORAL_TABLET | Freq: Every day | ORAL | 1 refills | Status: DC
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
## 2021-05-04 ENCOUNTER — Encounter: Payer: Self-pay | Admitting: Family Medicine

## 2021-05-05 ENCOUNTER — Other Ambulatory Visit: Payer: Self-pay

## 2021-05-05 MED ORDER — FREESTYLE LIBRE 2 SENSOR MISC: 5 refills | Status: DC

## 2021-05-11 ENCOUNTER — Ambulatory Visit: Payer: BC Managed Care – PPO | Admitting: Family Medicine

## 2021-05-26 ENCOUNTER — Other Ambulatory Visit: Payer: Self-pay | Admitting: Family Medicine

## 2021-06-23 ENCOUNTER — Encounter: Payer: Self-pay | Admitting: Family Medicine

## 2021-06-23 ENCOUNTER — Ambulatory Visit: Payer: BC Managed Care – PPO | Admitting: Family Medicine

## 2021-06-23 VITALS — BP 124/84 | HR 79 | Resp 16 | Ht 65.0 in | Wt 185.0 lb

## 2021-06-23 DIAGNOSIS — E1165 Type 2 diabetes mellitus with hyperglycemia: Secondary | ICD-10-CM

## 2021-06-23 DIAGNOSIS — Z0000 Encounter for general adult medical examination without abnormal findings: Secondary | ICD-10-CM

## 2021-06-23 DIAGNOSIS — E1159 Type 2 diabetes mellitus with other circulatory complications: Secondary | ICD-10-CM

## 2021-06-23 LAB — POCT GLYCOSYLATED HEMOGLOBIN (HGB A1C): HbA1c, POC (controlled diabetic range): 5.8 % (ref 0.0–7.0)

## 2021-06-23 MED ORDER — JANUMET XR 50-1000 MG PO TB24: ORAL_TABLET | ORAL | 0 refills | Status: DC

## 2021-06-23 NOTE — Patient Instructions (Addendum)
Establish with new PCP as discussed, if not , need 4 month follow up ? ?Been a pleasure taking care of you, all the best! ? ?Excellent exam ? ?Normal foot exam ? ?Stop Jardiance ? ?Continue janumet one daily ? ?Test sugar at least 3 times weekly ? ?Follow low carb diet ? ?Please get eye exam, this io past due ? ?Use wax softener in ears to lossen wax, do NOT use Q tips ? ?It is important that you exercise regularly at least 30 minutes 5 times a week. If you develop chest pain, have severe difficulty breathing, or feel very tired, stop exercising immediately and seek medical attention  ? ?Thanks for choosing [facility], we consider it a privelige to serve you. ? ? ?

## 2021-06-24 LAB — HEPATIC FUNCTION PANEL
ALT: 48 IU/L — ABNORMAL HIGH (ref 0–44)
AST: 26 IU/L (ref 0–40)
Albumin: 4.9 g/dL (ref 4.0–5.0)
Alkaline Phosphatase: 99 IU/L (ref 44–121)
Bilirubin Total: 0.6 mg/dL (ref 0.0–1.2)
Bilirubin, Direct: 0.22 mg/dL (ref 0.00–0.40)
Total Protein: 7.7 g/dL (ref 6.0–8.5)

## 2021-06-24 LAB — LIPID PANEL
Chol/HDL Ratio: 3.8 ratio (ref 0.0–5.0)
Cholesterol, Total: 151 mg/dL (ref 100–199)
HDL: 40 mg/dL (ref >39)
LDL Chol Calc (NIH): 80 mg/dL (ref 0–99)
Triglycerides: 181 mg/dL — ABNORMAL HIGH (ref 0–149)
VLDL Cholesterol Cal: 31 mg/dL (ref 5–40)

## 2021-06-25 ENCOUNTER — Encounter: Payer: Self-pay | Admitting: Family Medicine

## 2021-06-25 DIAGNOSIS — E1159 Type 2 diabetes mellitus with other circulatory complications: Secondary | ICD-10-CM | POA: Insufficient documentation

## 2021-06-25 NOTE — Assessment & Plan Note (Signed)
Devon Macdonald is reminded of the importance of commitment to daily physical activity for 30 minutes or more, as able and the need to limit carbohydrate intake to 30 to 60 grams per meal to help with blood sugar control.  ? ?The need to take medication as prescribed, test blood sugar as directed, and to call between visits if there is a concern that blood sugar is uncontrolled is also discussed.  ? ?Devon Macdonald is reminded of the importance of daily foot exam, annual eye examination, and good blood sugar, blood pressure and cholesterol control. ?Med adjustment made at visit ? ? ?  Latest Ref Rng & Units 06/23/2021  ?  4:44 PM 06/23/2021  ?  1:46 PM 03/08/2021  ?  4:53 PM 11/10/2020  ?  4:37 PM 10/28/2019  ?  3:16 PM  ?Diabetic Labs  ?HbA1c 0.0 - 7.0 % 5.8    6.0   11.2     ?Micro/Creat Ratio 0 - 29 mg/g creat   120      ?Chol 100 - 199 mg/dL  151   240   159   234    ?HDL >39 mg/dL  40   44   40   47    ?Calc LDL 0 - 99 mg/dL  80   151   82   151    ?Triglycerides 0 - 149 mg/dL  181   245   220   200    ?Creatinine 0.76 - 1.27 mg/dL   1.12   1.07   1.09    ? ? ?  06/23/2021  ?  4:18 PM 11/10/2020  ?  3:32 PM 04/08/2020  ?  1:31 PM 11/10/2019  ?  3:51 PM 11/03/2019  ?  2:10 PM 10/28/2019  ?  1:47 PM 10/20/2019  ?  4:05 PM  ?BP/Weight  ?Systolic BP A999333 [id number] 139 118   ?Diastolic BP 84 88 86 78 96 81   ?Wt. (Lbs) 185 200 205 210 210 206.04 185  ?BMI 30.79 kg/m2 33.28 kg/m2 34.11 kg/m2 34.95 kg/m2 34.95 kg/m2 34.29 kg/m2 30.79 kg/m2  ? ?   ? View : No data to display.  ?  ?  ?  ? ? ? ? ? ?

## 2021-06-25 NOTE — Assessment & Plan Note (Signed)
Mr. Groft is reminded of the importance of commitment to daily physical activity for 30 minutes or more, as able and the need to limit carbohydrate intake to 30 to 60 grams per meal to help with blood sugar control.  ? ?The need to take medication as prescribed, test blood sugar as directed, and to call between visits if there is a concern that blood sugar is uncontrolled is also discussed.  ? ?Mr. Blann is reminded of the importance of daily foot exam, annual eye examination, and good blood sugar, blood pressure and cholesterol control. ? ? ?  Latest Ref Rng & Units 06/23/2021  ?  4:44 PM 06/23/2021  ?  1:46 PM 03/08/2021  ?  4:53 PM 11/10/2020  ?  4:37 PM 10/28/2019  ?  3:16 PM  ?Diabetic Labs  ?HbA1c 0.0 - 7.0 % 5.8    6.0   11.2     ?Micro/Creat Ratio 0 - 29 mg/g creat   120      ?Chol 100 - 199 mg/dL  408   144   818   563    ?HDL >39 mg/dL  40   44   40   47    ?Calc LDL 0 - 99 mg/dL  80   149   82   702    ?Triglycerides 0 - 149 mg/dL  637   858   850   277    ?Creatinine 0.76 - 1.27 mg/dL   4.12   8.78   6.76    ? ? ?  06/23/2021  ?  4:18 PM 11/10/2020  ?  3:32 PM 04/08/2020  ?  1:31 PM 11/10/2019  ?  3:51 PM 11/03/2019  ?  2:10 PM 10/28/2019  ?  1:47 PM 10/20/2019  ?  4:05 PM  ?BP/Weight  ?Systolic BP 124 138 135 118 139 118   ?Diastolic BP 84 88 86 78 96 81   ?Wt. (Lbs) 185 200 205 210 210 206.04 185  ?BMI 30.79 kg/m2 33.28 kg/m2 34.11 kg/m2 34.95 kg/m2 34.95 kg/m2 34.29 kg/m2 30.79 kg/m2  ? ?   ? View : No data to display.  ?  ?  ?  ? ? ? ? ?Medications reduced due to excellent control ?

## 2021-06-25 NOTE — Assessment & Plan Note (Signed)
Annual exam as documented. . Immunization and cancer screening needs are specifically addressed at this visit.  

## 2021-06-25 NOTE — Progress Notes (Signed)
? ?Devon Macdonald.     MRN: [id number]      DOB: 1981-01-20 ? ? ?HPI: ?Patient is in for annual physical exam.GlycoHB checked at visit ? ? labs, are reviewed. ?Immunization is reviewed , and  updated if needed. ? ? ? ?PE; ?BP 124/84   Pulse 79   Resp 16   Ht 5\' 5"  (1.651 m)   Wt 185 lb (83.9 kg)   SpO2 96%   BMI 30.79 kg/m?  ? ?Pleasant male, alert and oriented x 3, in no cardio-pulmonary distress. ?Afebrile. ?HEENT ?No facial trauma or asymetry. Sinuses non tender. ?EOMI ?External ears normal, Bilateral cerumen impaction ?Neck: supple, no adenopathy,JVD or thyromegaly.No bruits. ? ?Chest: ?Clear to ascultation bilaterally.No crackles or wheezes. ?Non tender to palpation ? ?Cardiovascular system; ?Heart sounds normal,  S1 and  S2 ,no S3.  No murmur, or thrill. ?Apical beat not displaced ?Peripheral pulses normal. ? ?Abdomen: ?Soft, non tender, no organomegaly or masses. ?No bruits. ?Bowel sounds normal. ?No guarding, tenderness or rebound. ? ? ? ?Musculoskeletal exam: ?Full ROM of spine, hips , shoulders and knees. ?No deformity ,swelling or crepitus noted. ?No muscle wasting or atrophy.  ? ?Neurologic: ?Cranial nerves 2 to 12 intact. ?Power, tone ,sensation and reflexes normal throughout. ?No disturbance in gait. No tremor. ? ?Skin: ?Intact, no ulceration, erythema , scaling or rash noted. ?Pigmentation normal throughout ? ?Psych; ?Normal mood and affect. Judgement and concentration normal ? ? ?Assessment & Plan:  ?Annual physical exam ?Annual exam as documented. ? ?Immunization and cancer screening needs are specifically addressed at this visit. ? ? ?Type 2 diabetes mellitus with hyperglycemia (HCC) ?[name] is reminded of the importance of commitment to daily physical activity for 30 minutes or more, as able and the need to limit carbohydrate intake to 30 to 60 grams per meal to help with blood sugar control.  ? ?The need to take medication as prescribed, test blood sugar as directed, and to call  between visits if there is a concern that blood sugar is uncontrolled is also discussed.  ? ?[name] is reminded of the importance of daily foot exam, annual eye examination, and good blood sugar, blood pressure and cholesterol control. ?Med adjustment made at visit ? ? ?  Latest Ref Rng & Units 06/23/2021  ?  4:44 PM 06/23/2021  ?  1:46 PM 03/08/2021  ?  4:53 PM 11/10/2020  ?  4:37 PM 10/28/2019  ?  3:16 PM  ?Diabetic Labs  ?HbA1c 0.0 - 7.0 % 5.8    6.0   11.2     ?Micro/Creat Ratio 0 - 29 mg/g creat   120      ?Chol 100 - 199 mg/dL  12/28/2019   629   528   413    ?HDL >39 mg/dL  40   44   40   47    ?Calc LDL 0 - 99 mg/dL  80   244   82   010    ?Triglycerides 0 - 149 mg/dL  272   536   644   034    ?Creatinine 0.76 - 1.27 mg/dL   742   5.95   6.38    ? ? ?  06/23/2021  ?  4:18 PM 11/10/2020  ?  3:32 PM 04/08/2020  ?  1:31 PM 11/10/2019  ?  3:51 PM 11/03/2019  ?  2:10 PM 10/28/2019  ?  1:47 PM 10/20/2019  ?  4:05 PM  ?BP/Weight  ?  Systolic BP 124 138 135 118 139 118   ?Diastolic BP 84 88 86 78 96 81   ?Wt. (Lbs) 185 200 205 210 210 206.04 185  ?BMI 30.79 kg/m2 33.28 kg/m2 34.11 kg/m2 34.95 kg/m2 34.95 kg/m2 34.29 kg/m2 30.79 kg/m2  ? ?   ? View : No data to display.  ?  ?  ?  ? ? ? ? ? ? ?Type 2 diabetes mellitus with vascular disease (HCC) ?[name] is reminded of the importance of commitment to daily physical activity for 30 minutes or more, as able and the need to limit carbohydrate intake to 30 to 60 grams per meal to help with blood sugar control.  ? ?The need to take medication as prescribed, test blood sugar as directed, and to call between visits if there is a concern that blood sugar is uncontrolled is also discussed.  ? ?[name] is reminded of the importance of daily foot exam, annual eye examination, and good blood sugar, blood pressure and cholesterol control. ? ? ?  Latest Ref Rng & Units 06/23/2021  ?  4:44 PM 06/23/2021  ?  1:46 PM 03/08/2021  ?  4:53 PM 11/10/2020  ?  4:37 PM 10/28/2019  ?  3:16 PM  ?Diabetic Labs   ?HbA1c 0.0 - 7.0 % 5.8    6.0   11.2     ?Micro/Creat Ratio 0 - 29 mg/g creat   120      ?Chol 100 - 199 mg/dL  725   366   440   347    ?HDL >39 mg/dL  40   44   40   47    ?Calc LDL 0 - 99 mg/dL  80   425   82   956    ?Triglycerides 0 - 149 mg/dL  387   564   332   951    ?Creatinine 0.76 - 1.27 mg/dL   8.84   1.66   0.63    ? ? ?  06/23/2021  ?  4:18 PM 11/10/2020  ?  3:32 PM 04/08/2020  ?  1:31 PM 11/10/2019  ?  3:51 PM 11/03/2019  ?  2:10 PM 10/28/2019  ?  1:47 PM 10/20/2019  ?  4:05 PM  ?BP/Weight  ?Systolic BP 124 138 135 118 139 118   ?Diastolic BP 84 88 86 78 96 81   ?Wt. (Lbs) 185 200 205 210 210 206.04 185  ?BMI 30.79 kg/m2 33.28 kg/m2 34.11 kg/m2 34.95 kg/m2 34.95 kg/m2 34.29 kg/m2 30.79 kg/m2  ? ?   ? View : No data to display.  ?  ?  ?  ? ? ? ? ?Medications reduced due to excellent control ? ?

## 2021-06-27 IMAGING — DX DG ABDOMEN 1V
1 series · 1 of 1 positions shown · non-contrast
Comparison: None.

CLINICAL DATA: Right flank pain.

EXAM:
ABDOMEN - 1 VIEW

[abdomen kub]
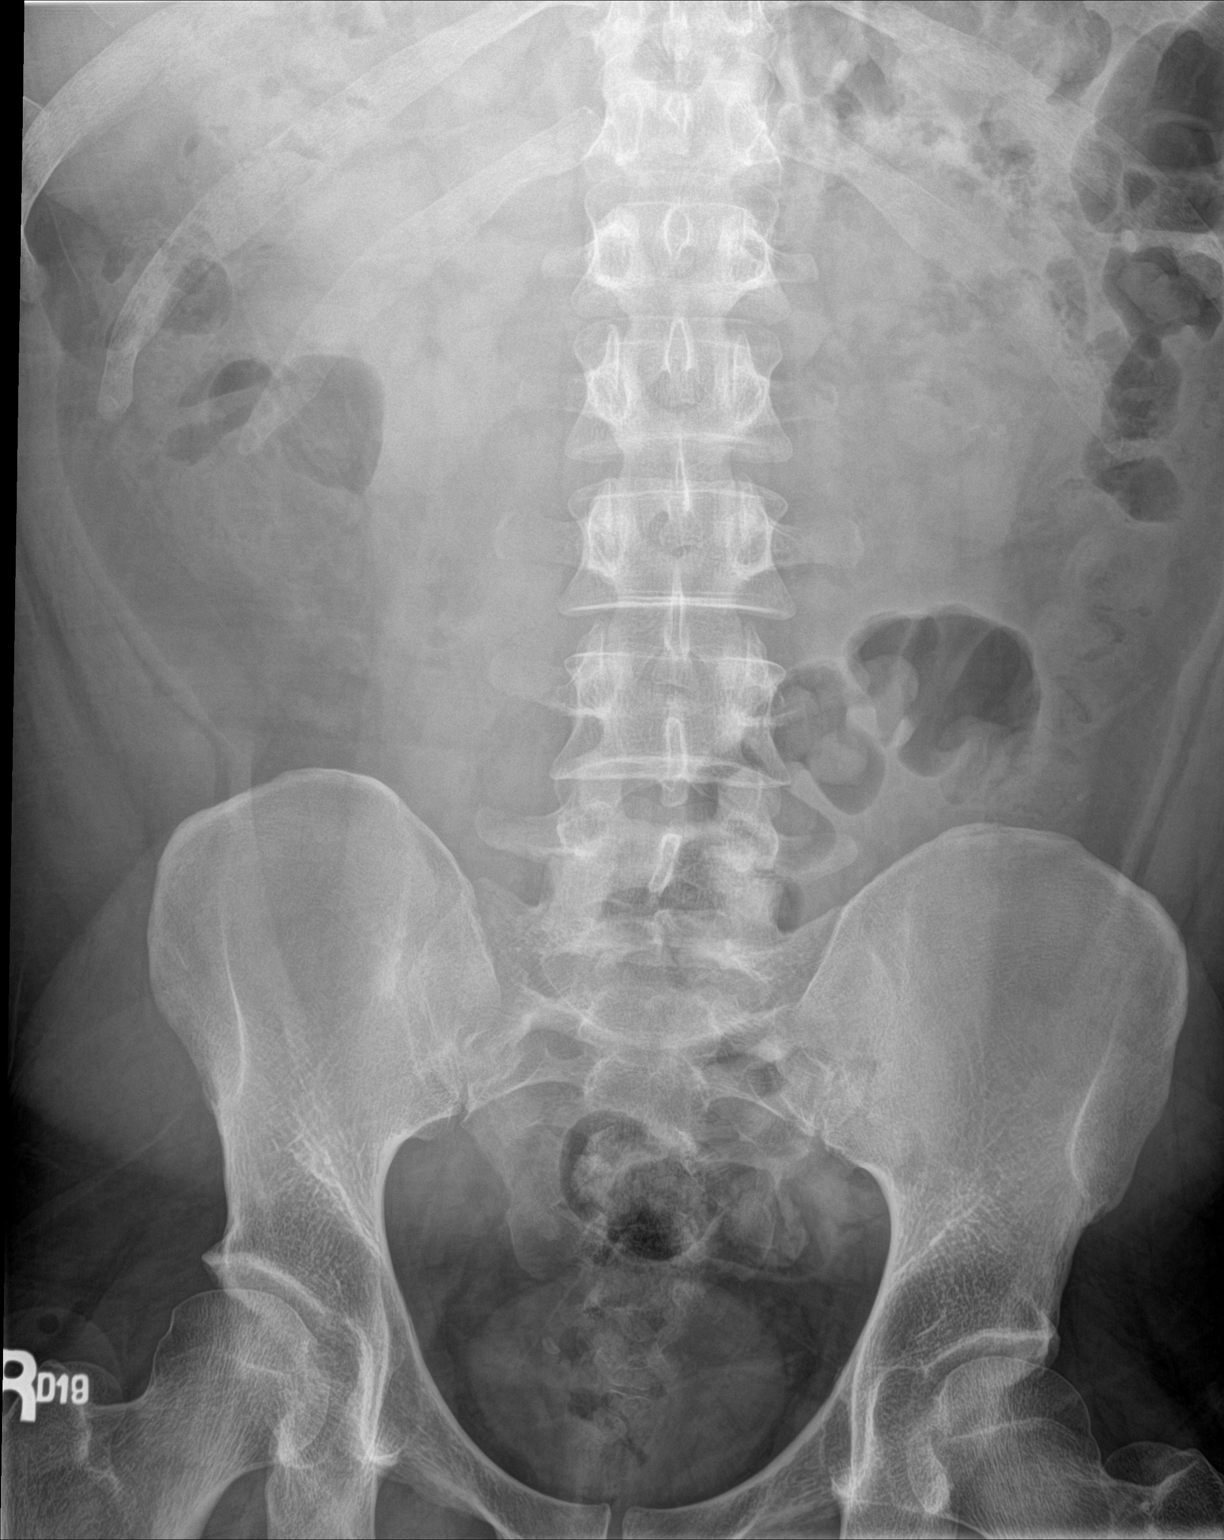

[1 of 1 positions shown; findings below may reference images not displayed]

FINDINGS: The bowel gas pattern is normal. No radio-opaque calculi or other
significant radiographic abnormality are seen.
IMPRESSION: Negative.

## 2021-07-06 ENCOUNTER — Other Ambulatory Visit: Payer: Self-pay | Admitting: Family Medicine

## 2021-07-07 ENCOUNTER — Ambulatory Visit (INDEPENDENT_AMBULATORY_CARE_PROVIDER_SITE_OTHER): Payer: BC Managed Care – PPO | Admitting: Family Medicine

## 2021-07-07 ENCOUNTER — Encounter: Payer: Self-pay | Admitting: Family Medicine

## 2021-07-07 DIAGNOSIS — R051 Acute cough: Secondary | ICD-10-CM | POA: Diagnosis not present

## 2021-07-07 DIAGNOSIS — Z91030 Bee allergy status: Secondary | ICD-10-CM | POA: Diagnosis not present

## 2021-07-07 DIAGNOSIS — J309 Allergic rhinitis, unspecified: Secondary | ICD-10-CM

## 2021-07-07 MED ORDER — METHYLPREDNISOLONE 4 MG PO TBPK: ORAL_TABLET | ORAL | 0 refills | Status: DC

## 2021-07-07 MED ORDER — BENZONATATE 100 MG PO CAPS: 100.0000 mg | ORAL_CAPSULE | Freq: Two times a day (BID) | ORAL | 0 refills | Status: DC | PRN

## 2021-07-07 MED ORDER — EPINEPHRINE 0.3 MG/0.3ML IJ SOAJ: 0.3000 mg | INTRAMUSCULAR | 3 refills | Status: AC | PRN

## 2021-07-07 NOTE — Progress Notes (Signed)
Virtual Visit via Telephone Note   This visit type was conducted due to national recommendations for restrictions regarding the COVID-19 Pandemic (e.g. social distancing) in an effort to limit this patient's exposure and mitigate transmission in our community.  Due to his co-morbid illnesses, this patient is at least at moderate risk for complications without adequate follow up.  This format is felt to be most appropriate for this patient at this time.  The patient did not have access to video technology/had technical difficulties with video requiring transitioning to audio format only (telephone).  All issues noted in this document were discussed and addressed.  No physical exam could be performed with this format.  Please refer to the patient's chart for his  consent to telehealth for [facility].   Evaluation Performed: acute visit Date:  07/07/2021   ID:  Devon Macdonald., DOB 05-14-1980, MRN [id number]  Patient Location: Home Provider Location: Office/Clinic  Participants: Patient Location of Patient: Home Location of Provider: Telehealth Consent was obtain for visit to be over via telehealth. I verified that I am speaking with the correct person using two identifiers.  PCP:  Fayrene Helper, MD   Chief Complaint:  cough  History of Present Illness:    Devon Macdonald. is a 41 y.o. male with complaints of cough, congestion, sore throat, post nasal drip, and sinus pressure since 07/04/21. She reports that "everything is sitting on his chest." He has tried OTC dayquil, nyquil, and Muscinex, taking his singular with minimum relief. He reports relief while taking flonase. He noted coughing green phlegm on 07/06/21. He denies  sinus pain, fever, chills, fatigue, and headaches. He reports occasional sneezing with runny nose.    The patient does not have symptoms concerning for COVID-19 infection (fever, chills, cough, or new shortness of breath).   Past Medical,  Surgical, Social History, Allergies, and Medications have been Reviewed.  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   Continuous Blood Gluc Receiver (FREESTYLE LIBRE 2 READER) DEVI Use to check blood glucose daily dx e11.65   Continuous Blood Gluc Sensor (FREESTYLE LIBRE 2 SENSOR) MISC Use to check glucose daily dx e11.65   EPINEPHrine 0.3 mg/0.3 mL IJ SOAJ injection Inject 0.3 mLs (0.3 mg total) into the muscle as needed for anaphylaxis.   fluticasone (FLONASE) 50 MCG/ACT nasal spray SPRAY 2 SPRAYS INTO EACH NOSTRIL EVERY DAY   glucose blood test strip Use as instructed   JARDIANCE 10 MG TABS tablet TAKE 1 TABLET BY MOUTH EVERY DAY BEFORE BREAKFAST   Lancets 30G MISC Twice daily testing dx e11.9   montelukast (SINGULAIR) 10 MG tablet TAKE 1 TABLET BY MOUTH EVERYDAY AT BEDTIME   Multiple Vitamin (MULTIVITAMIN WITH MINERALS) TABS Take 1 tablet by mouth daily.   rosuvastatin (CRESTOR) 5 MG tablet Take 1 tablet (5 mg total) by mouth daily.   SitaGLIPtin-MetFORMIN HCl (JANUMET XR) 50-1000 MG TB24 Take one tablet by mouth once daily for diabetes   triamterene-hydrochlorothiazide (MAXZIDE) 75-50 MG tablet Take 1 tablet by mouth daily.  Allergies:   Other, Bee venom, Doxycycline, and Sulfa antibiotics   ROS:   Please see the history of present illness.     All other systems reviewed and are negative.   Labs/Other Tests and Data Reviewed:    Recent Labs: 11/10/2020: Hemoglobin 16.1; Platelets 294; TSH 2.040 03/08/2021: BUN 9; Creatinine, Ser 1.12; Potassium 4.5; Sodium 134 06/23/2021: ALT 48   Recent Lipid Panel Lab Results  Component Value Date/Time   CHOL 151  06/23/2021 01:46 PM   TRIG 181 (H) 06/23/2021 01:46 PM   HDL 40 06/23/2021 01:46 PM   CHOLHDL 3.8 06/23/2021 01:46 PM   CHOLHDL 4.3 08/09/2017 12:00 AM   LDLCALC 80 06/23/2021 01:46 PM   LDLCALC 135 (H) 08/09/2017 12:00 AM    Wt Readings from Last 3 Encounters:  06/23/21 185 lb (83.9 kg)  11/10/20 200 lb (90.7 kg)  04/08/20 205 lb (93 kg)     Objective:    Vital Signs:  There were no vitals taken for this visit.     ASSESSMENT & PLAN:    Allergic Sinusitis -will prescribe a short course of steroids to decrease airway inflammation and Tessalon perles for cough -encouraged to continue supportive and symptomatic management  -refilled epipen  Time:   Today, I have spent 15 minutes reviewing the chart, including problem list, medications, and with the patient with telehealth technology discussing the above problems.   Medication Adjustments/Labs and Tests Ordered: Current medicines are reviewed at length with the patient today.  Concerns regarding medicines are outlined above.   Tests Ordered: No orders of the defined types were placed in this encounter.   Medication Changes: No orders of the defined types were placed in this encounter.    Note: This dictation was prepared with Dragon dictation along with smaller phrase technology. Similar sounding words can be transcribed inadequately or may not be corrected upon review. Any transcriptional errors that result from this process are unintentional.      Disposition:  Follow up  Signed, Alvira Monday, FNP  07/07/2021 4:09 PM     [place]

## 2021-08-28 ENCOUNTER — Encounter: Payer: Self-pay | Admitting: Family Medicine

## 2021-08-29 ENCOUNTER — Telehealth: Payer: Self-pay | Admitting: Family Medicine

## 2021-08-29 NOTE — Telephone Encounter (Signed)
Patient called in regard to Kidney Stone. Patient is already passing blood. Wants to see if can get something sent in to help pass stone. Patient states they are unable to come in to office due to location. Wants a call back in regard.

## 2021-08-30 ENCOUNTER — Telehealth: Payer: Self-pay | Admitting: Family Medicine

## 2021-08-30 NOTE — Telephone Encounter (Signed)
Went to [facility] yesterday and said that he had a uti and was given cirpo for 10 days and its making him itch. Wants it changed to something else. Allergy list states he is allergic to doxycycline and sulfur drugs also. Please advise

## 2021-08-30 NOTE — Telephone Encounter (Signed)
Pt states that he had went to urgent care & was dx with a uti. The abx cipro is making him itch. Wants to know if we can changed the abx?

## 2021-08-30 NOTE — Telephone Encounter (Signed)
Pt aware.

## 2021-08-31 ENCOUNTER — Encounter: Payer: Self-pay | Admitting: Nurse Practitioner

## 2021-08-31 ENCOUNTER — Ambulatory Visit (INDEPENDENT_AMBULATORY_CARE_PROVIDER_SITE_OTHER): Payer: BC Managed Care – PPO | Admitting: Nurse Practitioner

## 2021-08-31 DIAGNOSIS — N10 Acute tubulo-interstitial nephritis: Secondary | ICD-10-CM | POA: Insufficient documentation

## 2021-08-31 MED ORDER — CEFPODOXIME PROXETIL 100 MG PO TABS: 100.0000 mg | ORAL_TABLET | Freq: Two times a day (BID) | ORAL | 0 refills | Status: AC

## 2021-08-31 NOTE — Assessment & Plan Note (Addendum)
Stop Cipro due to complaints of itching Start cepodoxime100 mg twice daily for 7 days. Drink at least 64 ounces of water daily and take Tylenol as needed for pain and fever. Discussed with patient that he might need to come off Jardiance if he has urinary tract infection again.  Call the office in 1 week if his symptoms does not resolve

## 2021-08-31 NOTE — Telephone Encounter (Signed)
In office appt scheuled

## 2021-08-31 NOTE — Progress Notes (Addendum)
   Devon Macdonald.     MRN: [id number]      DOB: 05/30/1980   HPI Devon Macdonald with past medical history of hypertension, migraine, type 2 diabetes is here for follow complains of generalized itching skin.  Patient was started on Cipro 2 days ago for pyelonephritis.  Patient stated that he has been having generalized itching since he started taking Cipro, taking Benadryl as needed Benadryl is helping his itching.  Patient denies fever, chills, abdominal pain, malaise, fatigue dysuria, hesitancy.  Still has right flank pain and urine discoloration, stated that he also had UTI  a year ago         ROS Denies recent fever or chills. Denies sinus pressure, nasal congestion, ear pain or sore throat. Denies chest congestion, productive cough or wheezing. Denies chest pains, palpitations and leg swelling Denies abdominal pain, nausea, vomiting,diarrhea or constipation.  . Denies joint pain, swelling and limitation in mobility. Denies headaches, seizures, numbness, or tingling. Denies depression, anxiety or insomnia. Denies skin break down or rash.   PE  BP 131/82 (BP Location: Right Arm, Patient Position: Sitting, Cuff Size: Large)   Pulse (!) 107   Ht 5\' 5"  (1.651 m)   Wt 191 lb (86.6 kg)   SpO2 95%   BMI 31.78 kg/m   Patient alert and oriented and in no cardiopulmonary distress.  Chest: Clear to auscultation bilaterally.  CVS: S1, S2 no murmurs, no S3.Regular rate.  ABD: Soft non tender.   Ext: No edema  MS: Adequate ROM spine, shoulders, hips and knees has mild tenderness of right flank area on palpation   Skin: Intact, no ulcerations or rash noted.  Psych: Good eye contact, normal affect. Memory intact not anxious or depressed appearing.  CNS: CN 2-12 intact, power,  normal throughout.no focal deficits noted.   Assessment & Plan Acute pyelonephritis Stop Cipro due to complaints of itching Start cepodoxime100 mg twice daily for 7 days. Drink at least 64 ounces  of water daily and take Tylenol as needed for pain and fever. Discussed with patient that he might need to come off Jardiance if he has urinary tract infection again.  Call the office in 1 week if his symptoms does not resolve

## 2021-08-31 NOTE — Patient Instructions (Addendum)
Please stop taking cipro . Start taking cefpodoxime 100mg  twice daily for your UTI   Thanks for choosing [facility], we consider it a privelige to serve you.

## 2021-09-18 ENCOUNTER — Other Ambulatory Visit: Payer: Self-pay | Admitting: Family Medicine

## 2021-10-05 ENCOUNTER — Telehealth (INDEPENDENT_AMBULATORY_CARE_PROVIDER_SITE_OTHER): Payer: BC Managed Care – PPO | Admitting: Family Medicine

## 2021-10-05 VITALS — BP 120/75 | Wt 185.0 lb

## 2021-10-05 DIAGNOSIS — M545 Low back pain, unspecified: Secondary | ICD-10-CM

## 2021-10-05 MED ORDER — PREDNISONE 20 MG PO TABS: 20.0000 mg | ORAL_TABLET | Freq: Two times a day (BID) | ORAL | 0 refills | Status: DC

## 2021-10-05 MED ORDER — GABAPENTIN 100 MG PO CAPS: ORAL_CAPSULE | ORAL | 0 refills | Status: DC

## 2021-10-05 NOTE — Patient Instructions (Addendum)
You are treated for acute disabling back pain.  Please get an x ray of your lower back at [facility] hospital , need to get there by 5;45, they close at 6 pm  5 day course of prednisone is prescribed , take as directed. This will temporarily raise your blood sugar unfortunately  Gabapentin is prescribed for pain management also. I do not expect that you will need  to take the gabapentin regularly for more than 1 week  If pain worsens or new weakness , numbness or incontinence develop , you need to go to the ED  Work excuse from  8/18 to return 10/09/2021 ( for part time job)  Thanks for choosing [facility], we consider it a privelige to serve you.

## 2021-10-09 ENCOUNTER — Encounter: Payer: Self-pay | Admitting: Family Medicine

## 2021-10-09 DIAGNOSIS — M545 Low back pain, unspecified: Secondary | ICD-10-CM | POA: Insufficient documentation

## 2021-10-09 NOTE — Assessment & Plan Note (Signed)
Acute disabling pain triggered by sneezing reported. X ray L spine short course of prednisone and gabapentin Work [employer] for part time job from 8/18 to 10/09/2021 ED/ urgent care  if worsens or fails to respond

## 2021-10-09 NOTE — Progress Notes (Signed)
Virtual Visit via Video Note  I connected with [name]. on 10/09/21 at  4:00 PM EDT by a video enabled telemedicine application and verified that I am speaking with the correct person using two identifiers.  Location: Patient: work Provider: office   I discussed the limitations of evaluation and management by telemedicine and the availability of in person appointments. The patient expressed understanding and agreed to proceed.  History of Present Illness: 2 day /o acute low back pain aggravated by sneezing, radiates down posterior thighs to knees , rated at 10 No new weakness, numbness or incontinence, has used tylenol with no success Reports good blood sugar control Has part time job which he feels will be challenging with these symptoms   Observations/Objective: Blood pressure 120/75, weight 185 lb (83.9 kg). Good communication with no confusion and intact memory. Alert and oriented x 3 No signs of respiratory distress during speech. Pt in pain   Assessment and Plan:  Lumbar pain Acute disabling pain triggered by sneezing reported. X ray L spine short course of prednisone and gabapentin Work [employer] for part time job from 8/18 to 10/09/2021 ED/ urgent care  if worsens or fails to respond  Follow Up Instructions:    I discussed the assessment and treatment plan with the patient. The patient was provided an opportunity to ask questions and all were answered. The patient agreed with the plan and demonstrated an understanding of the instructions.   The patient was advised to call back or seek an in-person evaluation if the symptoms worsen or if the condition fails to improve as anticipated.  I provided 12  minutes of face-to-face time during this encounter.   [name], MD

## 2021-10-17 LAB — HM DIABETES EYE EXAM

## 2021-11-02 ENCOUNTER — Ambulatory Visit: Payer: BC Managed Care – PPO | Admitting: Nurse Practitioner

## 2021-11-02 ENCOUNTER — Encounter: Payer: Self-pay | Admitting: Nurse Practitioner

## 2021-11-02 VITALS — BP 123/84 | HR 76 | Temp 97.8°F | Ht 66.54 in | Wt 190.5 lb

## 2021-11-02 DIAGNOSIS — E1159 Type 2 diabetes mellitus with other circulatory complications: Secondary | ICD-10-CM

## 2021-11-02 DIAGNOSIS — G43119 Migraine with aura, intractable, without status migrainosus: Secondary | ICD-10-CM

## 2021-11-02 DIAGNOSIS — E785 Hyperlipidemia, unspecified: Secondary | ICD-10-CM | POA: Diagnosis not present

## 2021-11-02 DIAGNOSIS — Z23 Encounter for immunization: Secondary | ICD-10-CM | POA: Diagnosis not present

## 2021-11-02 DIAGNOSIS — Z7689 Persons encountering health services in other specified circumstances: Secondary | ICD-10-CM | POA: Diagnosis not present

## 2021-11-02 DIAGNOSIS — I10 Essential (primary) hypertension: Secondary | ICD-10-CM

## 2021-11-02 NOTE — Assessment & Plan Note (Signed)
Chronic.  Controlled.  Continue with current medication regimen of Jardiance, Janumet.  A1c is well controlled at 6.6.  Would like to decrease his medications.  If A1c is less than 7 will stop Januvia.  Labs ordered today.  Return to clinic in 3 months for reevaluation.  Call sooner if concerns arise.

## 2021-11-02 NOTE — Assessment & Plan Note (Signed)
Chronic.  Controlled.  Continue with current medication regimen of Benzapril 5mg .  Labs ordered today.  Return to clinic in 3 months for reevaluation.  Call sooner if concerns arise.

## 2021-11-02 NOTE — Assessment & Plan Note (Signed)
Chronic.  Controlled.  Continue with current medication regimen.  Labs ordered today.  Return to clinic in 6 months for reevaluation.  Call sooner if concerns arise.  ? ?

## 2021-11-02 NOTE — Progress Notes (Unsigned)
BP 123/84   Pulse 76   Temp 97.8 F (36.6 C) (Oral)   Ht 5' 6.54" (1.69 m)   Wt 190 lb 8 oz (86.4 kg)   SpO2 98%   BMI 30.26 kg/m    Subjective:    Patient ID: Devon Macdonald., male    DOB: 1980/07/16, 41 y.o.   MRN: [id number]  HPI: Devon Macdonald. is a 41 y.o. male  Chief Complaint  Patient presents with   Establish Care    Patient would like to discuss diabetic medication changes. Patient states he is not having side effects from medication but would like to decrease the amount of medication he has to take.    Patient presents to clinic to establish care with new PCP.  Introduced to Designer, jewellery role and practice setting.  All questions answered.  Discussed provider/patient relationship and expectations.  Patient reports a history of Hypertension, Type 2 diabetes, high cholesterol, Sleep apnea, migraines.  Patient states his primary care was [place] and it was too far away.    Patient denies a history of: Thyroid problems, Depression, Anxiety, Neurological problems, and Abdominal problems.   DIABETES Hypoglycemic episodes:no Polydipsia/polyuria: no Visual disturbance: no Chest pain: no Paresthesias: no Glucose Monitoring: yes  Accucheck frequency: Daily  Fasting glucose: 120  Post prandial:  Evening:  Before meals: Taking Insulin?: no  Long acting insulin:  Short acting insulin: Blood Pressure Monitoring: not checking Retinal Examination:  [name] Foot Exam: Not up to Date Diabetic Education: Not Completed Pneumovax: Up to Date Influenza: Up to Date Aspirin: no   Active Ambulatory Problems    Diagnosis Date Noted   Hyperlipemia 06/13/2007   HTN (hypertension) 06/13/2007   Migraine headache 07/04/2011   Abnormal TSH 07/04/2011   Cough 06/23/2014   Muscle spasms of neck 06/23/2014   Obesity (BMI 30.0-34.9) 11/03/2016   Allergy to honey bee venom 07/14/2018   Snoring 03/30/2019   Cervical strain, acute  08/25/2019   OSA (obstructive sleep apnea) 10/20/2019   Flank pain 10/28/2019   Elevated LFTs 11/10/2019   Annual physical exam 11/10/2020   Type 2 diabetes mellitus with vascular disease ([place]) 06/25/2021   Lumbar pain 10/09/2021   Resolved Ambulatory Problems    Diagnosis Date Noted   FUNGAL DERMATITIS 04/24/2008   OBESITY 06/13/2007   Overweight(278.02) 11/30/2008   CERUMEN IMPACTION, RIGHT 11/30/2008   Acute lymphadenitis 11/30/2008   FOOT PAIN, BILATERAL 06/06/2009   Back pain 10/17/2010   Depression 10/17/2010   Bronchitis, acute 01/10/2011   Bronchitis, acute 04/04/2011   Rhinitis, allergic 04/04/2011   Maculopapular rash 11/29/2011   Abdominal pain in male patient 11/29/2011   Epididymitis 11/29/2011   Dermatitis 12/04/2011   Sinusitis, acute ethmoidal 02/07/2012   Transaminitis 03/01/2012   Visit for annual health examination 02/21/2013   Headache 10/05/2013   Neck pain, acute 06/23/2014   Influenza 10/21/2014   Acute pharyngitis 10/21/2014   Acute febrile illness 10/23/2014   Acute maxillary sinusitis 05/30/2015   Acute bronchitis 05/30/2015   Chest pain 05/30/2015   Shoulder pain, left 10/23/2015   Stress at home 02/12/2019   Urinary frequency 10/28/2019   Allergic reaction to drug 11/10/2019   Cerumen impaction 11/13/2020   Type 2 diabetes mellitus with hyperglycemia ([place]) 12/18/2020   Acute sinus infection 12/18/2020   Acute pyelonephritis 08/31/2021   Past Medical History:  Diagnosis Date   Allergic cough 06/23/2014   Allergy    Diabetes mellitus without complication ([place])  Hypertension    Obesity    Past Surgical History:  Procedure Laterality Date   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 Interim medical history since our last visit  reviewed. Allergies and medications reviewed and updated.  Review of Systems  Eyes:  Negative for visual disturbance.  Respiratory:  Negative for chest tightness and shortness of breath.   Cardiovascular:  Negative for chest pain, palpitations and leg swelling.  Endocrine: Negative for polydipsia and polyuria.  Neurological:  Negative for dizziness, light-headedness, numbness and headaches.    Per HPI unless specifically indicated above     Objective:    BP 123/84   Pulse 76   Temp 97.8 F (36.6 C) (Oral)   Ht 5' 6.54" (1.69 m)   Wt 190 lb 8 oz (86.4 kg)   SpO2 98%   BMI 30.26 kg/m   Wt Readings from Last 3 Encounters:  11/02/21 190 lb 8 oz (86.4 kg)  10/05/21 185 lb (83.9 kg)  08/31/21 191 lb (86.6 kg)    Physical Exam Vitals and nursing note reviewed.  Constitutional:      General: He is not in acute distress.    Appearance: Normal appearance. He is normal weight. He is not ill-appearing, toxic-appearing or diaphoretic.  HENT:     Head: Normocephalic.     Right Ear: External ear normal.     Left Ear: External ear normal.     Nose: Nose normal. No congestion or rhinorrhea.     Mouth/Throat:     Mouth: Mucous membranes are moist.  Eyes:     General:        Right eye: No discharge.        Left eye: No discharge.     Extraocular Movements: Extraocular movements intact.     Conjunctiva/sclera: Conjunctivae normal.     Pupils: Pupils are equal, round, and reactive to light.  Cardiovascular:     Rate and Rhythm: Normal rate and regular rhythm.     Heart sounds: No murmur heard. Pulmonary:     Effort: Pulmonary effort is normal. No respiratory distress.     Breath sounds: Normal breath sounds. No wheezing, rhonchi or rales.  Abdominal:     General: Abdomen is flat. Bowel sounds are normal.  Musculoskeletal:     Cervical back: Normal range of motion and neck supple.  Skin:    General: Skin is warm and dry.     Capillary Refill: Capillary refill takes less than 2  seconds.  Neurological:     General: No focal deficit present.     Mental Status: He is alert and oriented to person, place, and time.  Psychiatric:        Mood and Affect: Mood normal.        Behavior: Behavior normal.        Thought Content: Thought content normal.        Judgment: Judgment normal.     Results for orders placed or performed in visit on 10/18/21  HM DIABETES EYE EXAM  Result Value Ref Range   HM Diabetic Eye Exam No Retinopathy No Retinopathy      Assessment & Plan:   Problem List Items Addressed This Visit       Cardiovascular and Mediastinum  HTN (hypertension)    Chronic.  Controlled.  Continue with current medication regimen of Benzapril 72m.  Labs ordered today.  Return to clinic in 3 months for reevaluation.  Call sooner if concerns arise.        Migraine headache    Chronic.  Controlled.  Continue with current medication regimen.  Labs ordered today.  Return to clinic in 6 months for reevaluation.  Call sooner if concerns arise.        Type 2 diabetes mellitus with vascular disease ([place] [facility]    Chronic.  Controlled.  Continue with current medication regimen of Jardiance, Janumet.  A1c is well controlled at 6.6.  Would like to decrease his medications.  If A1c is less than 7 will stop Januvia.  Labs ordered today.  Return to clinic in 3 months for reevaluation.  Call sooner if concerns arise.        Relevant Orders   Comp Met (CMET)   HgB A1c     Other   Hyperlipemia    Chronic.  Controlled.  Continue with current medication regimen of Crestor 558m  Labs ordered today.  Return to clinic in 6 months for reevaluation.  Call sooner if concerns arise.        Relevant Orders   Lipid Profile   Other Visit Diagnoses     Encounter to establish care       Need for influenza vaccination       Relevant Orders   Flu Vaccine QUAD 6+ mos PF IM (Fluarix Quad PF)   Need for vaccination against Streptococcus pneumoniae       Relevant Orders    Pneumococcal conjugate vaccine 20-valent (Prevnar 20)        Follow up plan: Return in about 3 months (around 02/01/2022) for Physical and Fasting labs.

## 2021-11-02 NOTE — Assessment & Plan Note (Signed)
Chronic.  Controlled.  Continue with current medication regimen of Crestor 5mg.  Labs ordered today.  Return to clinic in 6 months for reevaluation.  Call sooner if concerns arise.   

## 2021-11-03 ENCOUNTER — Encounter: Payer: Self-pay | Admitting: Nurse Practitioner

## 2021-11-03 LAB — COMPREHENSIVE METABOLIC PANEL
ALT: 50 IU/L — ABNORMAL HIGH (ref 0–44)
AST: 25 IU/L (ref 0–40)
Albumin/Globulin Ratio: 1.9 (ref 1.2–2.2)
Albumin: 4.8 g/dL (ref 4.1–5.1)
Alkaline Phosphatase: 81 IU/L (ref 44–121)
BUN/Creatinine Ratio: 9 (ref 9–20)
BUN: 9 mg/dL (ref 6–24)
Bilirubin Total: 0.4 mg/dL (ref 0.0–1.2)
CO2: 28 mmol/L (ref 20–29)
Calcium: 10.3 mg/dL — ABNORMAL HIGH (ref 8.7–10.2)
Chloride: 95 mmol/L — ABNORMAL LOW (ref 96–106)
Creatinine, Ser: 1.01 mg/dL (ref 0.76–1.27)
Globulin, Total: 2.5 g/dL (ref 1.5–4.5)
Glucose: 98 mg/dL (ref 70–99)
Potassium: 3.3 mmol/L — ABNORMAL LOW (ref 3.5–5.2)
Sodium: 141 mmol/L (ref 134–144)
Total Protein: 7.3 g/dL (ref 6.0–8.5)
eGFR: 96 mL/min/{1.73_m2} (ref >59)

## 2021-11-03 LAB — HEMOGLOBIN A1C
Est. average glucose Bld gHb Est-mCnc: 126 mg/dL
Hgb A1c MFr Bld: 6 % — ABNORMAL HIGH (ref 4.8–5.6)

## 2021-11-03 LAB — LIPID PANEL
Chol/HDL Ratio: 3.1 ratio (ref 0.0–5.0)
Cholesterol, Total: 147 mg/dL (ref 100–199)
HDL: 47 mg/dL (ref >39)
LDL Chol Calc (NIH): 66 mg/dL (ref 0–99)
Triglycerides: 205 mg/dL — ABNORMAL HIGH (ref 0–149)
VLDL Cholesterol Cal: 34 mg/dL (ref 5–40)

## 2021-11-03 MED ORDER — METFORMIN HCL 1000 MG PO TABS: 1000.0000 mg | ORAL_TABLET | Freq: Two times a day (BID) | ORAL | 1 refills | Status: DC

## 2021-11-03 NOTE — Progress Notes (Signed)
Hi Devon Macdonald. It was nice to meet you yesterday.  Your lab work shows that your A1c is well controlled at 6.0.  Your potassium is a little bit low.  I would like you to increase your intake of potassium rich foods such as bananas and potatoes. We can stop the Januvia and recheck in 6 months.  I would like you to come back and recheck your potassium in 1 month.  Please make a lab appt. No other concerns at this time.

## 2021-11-22 MED ORDER — EMPAGLIFLOZIN 10 MG PO TABS: ORAL_TABLET | ORAL | 1 refills | Status: DC

## 2021-12-19 ENCOUNTER — Other Ambulatory Visit: Payer: Self-pay | Admitting: Family Medicine

## 2021-12-19 DIAGNOSIS — I10 Essential (primary) hypertension: Secondary | ICD-10-CM

## 2022-01-04 ENCOUNTER — Encounter: Payer: Self-pay | Admitting: Nurse Practitioner

## 2022-01-31 NOTE — Progress Notes (Signed)
BP 102/69   Pulse 80   Temp 98.1 F (36.7 C) (Oral)   Ht 5' 6" (1.676 m)   Wt 199 lb 4.8 oz (90.4 kg)   SpO2 97%   BMI 32.17 kg/m    Subjective:    Patient ID: Devon Saucier., male    DOB: Feb 14, 1981, 41 y.o.   MRN: [id number]  HPI: Devon Thetford. is a 41 y.o. male presenting on 02/02/2022 for comprehensive medical examination. Current medical complaints include: weight loss   He currently lives with: Interim Problems from his last visit: no  HYPERTENSION / HYPERLIPIDEMIA Satisfied with current treatment? yes Duration of hypertension: years BP monitoring frequency: not checking BP range:  BP medication side effects: no Past BP meds:  Triameterne, HCTZ, and Benzapril Duration of hyperlipidemia: years Cholesterol medication side effects: yes Cholesterol supplements: none Past cholesterol medications: crestor Medication compliance: excellent compliance Aspirin: no Recent stressors: no Recurrent headaches: no Visual changes: no Palpitations: no Dyspnea: no Chest pain: no Lower extremity edema: no Dizzy/lightheaded: no   DIABETES Hypoglycemic episodes:no Polydipsia/polyuria: no Visual disturbance: no Chest pain: no Paresthesias: no Glucose Monitoring: no  Accucheck frequency: Not Checking  Fasting glucose:  Post prandial:  Evening:  Before meals: Taking Insulin?: no  Long acting insulin:  Short acting insulin: Blood Pressure Monitoring: not checking Retinal Examination: Up to Date Foot Exam: Up to Date Diabetic Education: Not Completed Pneumovax: Up to Date Influenza: Up to Date Aspirin: no  Depression Screen done today and results listed below:     02/02/2022    8:24 AM 11/02/2021    4:38 PM 08/31/2021   11:39 AM 07/07/2021    4:08 PM 03/02/2021    2:23 PM  Depression screen PHQ 2/9  Decreased Interest 0 0 0 0 0  Down, Depressed, Hopeless 0 0 0 0 0  PHQ - 2 Score 0 0 0 0 0  Altered sleeping 0 0     Tired, decreased energy 0 0      Change in appetite 0 0     Feeling bad or failure about yourself  0 0     Trouble concentrating 0 0     Moving slowly or fidgety/restless 0 0     Suicidal thoughts 0 0     PHQ-9 Score 0 0     Difficult doing work/chores Not difficult at all Not difficult at all       The patient does not have a history of falls. I did complete a risk assessment for falls. A plan of care for falls was documented.   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   empagliflozin (JARDIANCE) 10 MG TABS tablet TAKE 1 TABLET BY MOUTH EVERY DAY BEFORE BREAKFAST   EPINEPHrine 0.3 mg/0.3 mL IJ SOAJ injection Inject 0.3 mg into the muscle as needed for anaphylaxis.   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   montelukast (SINGULAIR) 10 MG tablet TAKE 1 TABLET BY MOUTH EVERYDAY AT BEDTIME   Multiple Vitamin (MULTIVITAMIN WITH MINERALS) TABS Take 1 tablet by mouth daily.   rosuvastatin (CRESTOR) 5 MG tablet Take 1 tablet (5 mg total) by mouth daily.   triamterene-hydrochlorothiazide (MAXZIDE) 75-50 MG tablet TAKE 1 TABLET BY MOUTH EVERY DAY   No current facility-administered medications on file prior to visit.    Allergies:  Allergies  Allergen Reactions   Other Rash and Swelling   Bee Venom      swelling   Ciprofloxacin Itching   Doxycycline Other (See Comments)    Reports groin swelling after taking pill.  Was hospitalized and received IV doxycycline without reaction.    Sulfa Antibiotics     Itching swelling of the hands and knees     Social History:  Social History   Socioeconomic History   Marital status: Married    Spouse name: Not on file   Number of children: Not on file   Years of education: Not on file   Highest education level: Not on file  Occupational History   Not on file  Tobacco Use   Smoking status: Never   Smokeless tobacco: Never  Vaping Use   Vaping Use: Never used  Substance and Sexual Activity   Alcohol use: No   Drug use: No   Sexual activity: Yes  Other Topics Concern   Not on file  Social History Narrative   Not on file   Social Determinants of Health   Financial Resource Strain: Not on file  Food Insecurity: Not on file  Transportation Needs: Not on file  Physical Activity: Not on file  Stress: Not on file  Social Connections: Not on file  Intimate Partner Violence: Not on file   Social History   Tobacco Use  Smoking Status Never  Smokeless Tobacco Never   Social History   Substance and Sexual Activity  Alcohol Use No    Family History:  Family History  Problem Relation Age of Onset   Diabetes Mother    Diabetes Father     Past medical history, surgical history, medications, allergies, family history and social history reviewed with patient today and changes made to appropriate areas of the chart.   Review of Systems  HENT:         Denies vision changes.  Eyes:  Negative for blurred vision and double vision.  Respiratory:  Negative for shortness of breath.   Cardiovascular:  Negative for chest pain, palpitations and leg swelling.  Neurological:  Negative for dizziness, tingling and headaches.  Endo/Heme/Allergies:  Negative for polydipsia.       Denies Polyuria   All other ROS negative except what is  listed above and in the HPI.      Objective:    BP 102/69   Pulse 80   Temp 98.1 F (36.7 C) (Oral)   Ht 5' 6" (1.676 m)   Wt 199 lb 4.8 oz (90.4 kg)   SpO2 97%   BMI 32.17 kg/m   Wt Readings from Last 3 Encounters:  02/02/22 199 lb 4.8 oz (90.4 kg)  11/02/21 190 lb 8 oz (86.4 kg)  10/05/21 185 lb (83.9 kg)    Physical Exam Vitals and  nursing note reviewed.  Constitutional:      General: He is not in acute distress.    Appearance: Normal appearance. He is obese. He is not ill-appearing, toxic-appearing or diaphoretic.  HENT:     Head: Normocephalic.     Right Ear: Tympanic membrane, ear canal and external ear normal.     Left Ear: Tympanic membrane, ear canal and external ear normal.     Nose: Nose normal. No congestion or rhinorrhea.     Mouth/Throat:     Mouth: Mucous membranes are moist.  Eyes:     General:        Right eye: No discharge.        Left eye: No discharge.     Extraocular Movements: Extraocular movements intact.     Conjunctiva/sclera: Conjunctivae normal.     Pupils: Pupils are equal, round, and reactive to light.  Cardiovascular:     Rate and Rhythm: Normal rate and regular rhythm.     Heart sounds: No murmur heard. Pulmonary:     Effort: Pulmonary effort is normal. No respiratory distress.     Breath sounds: Normal breath sounds. No wheezing, rhonchi or rales.  Abdominal:     General: Abdomen is flat. Bowel sounds are normal. There is no distension.     Palpations: Abdomen is soft.     Tenderness: There is no abdominal tenderness. There is no guarding.  Musculoskeletal:     Cervical back: Normal range of motion and neck supple.  Skin:    General: Skin is warm and dry.     Capillary Refill: Capillary refill takes less than 2 seconds.  Neurological:     General: No focal deficit present.     Mental Status: He is alert and oriented to person, place, and time.     Cranial Nerves: No cranial nerve deficit.     Motor: No weakness.     Deep Tendon  Reflexes: Reflexes normal.  Psychiatric:        Mood and Affect: Mood normal.        Behavior: Behavior normal.        Thought Content: Thought content normal.        Judgment: Judgment normal.     Results for orders placed or performed in visit on 11/02/21  Comp Met (CMET)  Result Value Ref Range   Glucose 98 70 - 99 mg/dL   BUN 9 6 - 24 mg/dL   Creatinine, Ser 1.01 0.76 - 1.27 mg/dL   eGFR 96 >59 mL/min/1.73   BUN/Creatinine Ratio 9 9 - 20   Sodium 141 134 - 144 mmol/L   Potassium 3.3 (L) 3.5 - 5.2 mmol/L   Chloride 95 (L) 96 - 106 mmol/L   CO2 28 20 - 29 mmol/L   Calcium 10.3 (H) 8.7 - 10.2 mg/dL   Total Protein 7.3 6.0 - 8.5 g/dL   Albumin 4.8 4.1 - 5.1 g/dL   Globulin, Total 2.5 1.5 - 4.5 g/dL   Albumin/Globulin Ratio 1.9 1.2 - 2.2   Bilirubin Total 0.4 0.0 - 1.2 mg/dL   Alkaline Phosphatase 81 44 - 121 IU/L   AST 25 0 - 40 IU/L   ALT 50 (H) 0 - 44 IU/L  HgB A1c  Result Value Ref Range   Hgb A1c MFr Bld 6.0 (H) 4.8 - 5.6 %   Est. average glucose Bld gHb Est-mCnc 126 mg/dL  Lipid Profile  Result Value Ref Range   Cholesterol, Total 147 100 -  199 mg/dL   Triglycerides 205 (H) 0 - 149 mg/dL   HDL 47 >39 mg/dL   VLDL Cholesterol Cal 34 5 - 40 mg/dL   LDL Chol Calc (NIH) 66 0 - 99 mg/dL   Chol/HDL Ratio 3.1 0.0 - 5.0 ratio      Assessment & Plan:   Problem List Items Addressed This Visit       Cardiovascular and Mediastinum   HTN (hypertension)    Chronic.  Controlled.  Continue with current medication regimen of Maxide and Benzapril.  Labs ordered today.  Return to clinic in 6 months for reevaluation.  Call sooner if concerns arise.        Relevant Orders   Comprehensive metabolic panel   Type 2 diabetes mellitus with vascular disease (HCC)    Chronic.  Controlled.  Continue with current medication regimen of Metformin, Jardiance.  Will start Mounjaro.  Goal is to get A1c within normal range.  Can consider decreasing Metformin in the future.  Side effects  and benefits of medication discussed during visit.  Labs ordered today.  Return to clinic in 1 months for reevaluation.  Will plan to increase to Mounjaro 68m at next visit. Call sooner if concerns arise.        Relevant Medications   tirzepatide (MOUNJARO) 2.5 MG/0.5ML Pen   Other Relevant Orders   HgB A1c   Microalbumin, Urine Waived     Other   Hyperlipemia    Chronic.  Controlled.  Continue with current medication regimen of Crestor 558m  Labs ordered today.  Return to clinic in 6 months for reevaluation.  Call sooner if concerns arise.        Relevant Orders   Lipid panel   Obesity (BMI 30.0-34.9)    Chronic. Will start Mounjaro 2.105m1m Side effects and benefits of medication discussed during visit.  Discussed how to properly give medication and plan to titrate.  Follow up in 1 month.  Call sooner if concerns arise.       Annual physical exam - Primary   Relevant Orders   TSH   Lipid panel   CBC with Differential/Platelet   Comprehensive metabolic panel   Urinalysis, Routine w reflex microscopic   HgB A1c   Other Visit Diagnoses     Bilateral impacted cerumen       Relevant Orders   Ear Lavage        Discussed aspirin prophylaxis for myocardial infarction prevention and decision was it was not indicated  LABORATORY TESTING:  Health maintenance labs ordered today as discussed above.    IMMUNIZATIONS:   - Tdap: Tetanus vaccination status reviewed: last tetanus booster within 10 years. - Influenza: Up to date - Pneumovax: Up to date - Prevnar: Not applicable - COVID: Not applicable - HPV: Not applicable - Shingrix vaccine: Not applicable  SCREENING: - Colonoscopy: Not applicable  Discussed with patient purpose of the colonoscopy is to detect colon cancer at curable precancerous or early stages   - AAA Screening: Not applicable  -Hearing Test: Not applicable  -Spirometry: Not applicable   PATIENT COUNSELING:    Sexuality: Discussed sexually  transmitted diseases, partner selection, use of condoms, avoidance of unintended pregnancy  and contraceptive alternatives.   Advised to avoid cigarette smoking.  I discussed with the patient that most people either abstain from alcohol or drink within safe limits (<=14/week and <=4 drinks/occasion for males, <=7/weeks and <= 3 drinks/occasion for females) and that the risk for alcohol disorders and  other health effects rises proportionally with the number of drinks per week and how often a drinker exceeds daily limits.  Discussed cessation/primary prevention of drug use and availability of treatment for abuse.   Diet: Encouraged to adjust caloric intake to maintain  or achieve ideal body weight, to reduce intake of dietary saturated fat and total fat, to limit sodium intake by avoiding high sodium foods and not adding table salt, and to maintain adequate dietary potassium and calcium preferably from fresh fruits, vegetables, and low-fat dairy products.    stressed the importance of regular exercise  Injury prevention: Discussed safety belts, safety helmets, smoke detector, smoking near bedding or upholstery.   Dental health: Discussed importance of regular tooth brushing, flossing, and dental visits.   Follow up plan: NEXT PREVENTATIVE PHYSICAL DUE IN 1 YEAR. Return in about 1 month (around 03/05/2022) for [facility] Management.

## 2022-02-02 ENCOUNTER — Telehealth: Payer: Self-pay | Admitting: Nurse Practitioner

## 2022-02-02 ENCOUNTER — Encounter: Payer: Self-pay | Admitting: Nurse Practitioner

## 2022-02-02 ENCOUNTER — Ambulatory Visit (INDEPENDENT_AMBULATORY_CARE_PROVIDER_SITE_OTHER): Payer: BC Managed Care – PPO | Admitting: Nurse Practitioner

## 2022-02-02 VITALS — BP 102/69 | HR 80 | Temp 98.1°F | Ht 66.0 in | Wt 199.3 lb

## 2022-02-02 DIAGNOSIS — E669 Obesity, unspecified: Secondary | ICD-10-CM

## 2022-02-02 DIAGNOSIS — Z0000 Encounter for general adult medical examination without abnormal findings: Secondary | ICD-10-CM

## 2022-02-02 DIAGNOSIS — H6123 Impacted cerumen, bilateral: Secondary | ICD-10-CM

## 2022-02-02 DIAGNOSIS — E1159 Type 2 diabetes mellitus with other circulatory complications: Secondary | ICD-10-CM | POA: Diagnosis not present

## 2022-02-02 DIAGNOSIS — E785 Hyperlipidemia, unspecified: Secondary | ICD-10-CM

## 2022-02-02 DIAGNOSIS — I10 Essential (primary) hypertension: Secondary | ICD-10-CM

## 2022-02-02 LAB — MICROSCOPIC EXAMINATION
Bacteria, UA: NONE SEEN
Epithelial Cells (non renal): NONE SEEN /hpf (ref 0–10)
WBC, UA: NONE SEEN /hpf (ref 0–5)

## 2022-02-02 LAB — URINALYSIS, ROUTINE W REFLEX MICROSCOPIC
Bilirubin, UA: NEGATIVE
Leukocytes,UA: NEGATIVE
Nitrite, UA: NEGATIVE
Protein,UA: NEGATIVE
Specific Gravity, UA: 1.015 (ref 1.005–1.030)
Urobilinogen, Ur: 0.2 mg/dL (ref 0.2–1.0)
pH, UA: 5 (ref 5.0–7.5)

## 2022-02-02 MED ORDER — TIRZEPATIDE 2.5 MG/0.5ML ~~LOC~~ SOAJ: 2.5000 mg | SUBCUTANEOUS | 0 refills | Status: DC

## 2022-02-02 NOTE — Assessment & Plan Note (Signed)
Chronic.  Controlled.  Continue with current medication regimen of Metformin, Jardiance.  Will start Mounjaro.  Goal is to get A1c within normal range.  Can consider decreasing Metformin in the future.  Side effects and benefits of medication discussed during visit.  Labs ordered today.  Return to clinic in 1 months for reevaluation.  Will plan to increase to [facility] 5mg  at next visit. Call sooner if concerns arise.

## 2022-02-02 NOTE — Assessment & Plan Note (Signed)
Chronic. Will start Mounjaro 2.5mg .  Side effects and benefits of medication discussed during visit.  Discussed how to properly give medication and plan to titrate.  Follow up in 1 month.  Call sooner if concerns arise.

## 2022-02-02 NOTE — Telephone Encounter (Signed)
Patient states that he was in the office ago today and the medication that was prescribed tirzepatide [facility]) 2.5 MG/0.5ML Pen   Per the pharmacy his insurance will not pay for it. Per pharmacy his insurance will pay for ozempic and he is wanting to know if he is able to take this medication. If so could another prescription be sent over to the pharmacy for ozempic.   [id number] [place], [place] - [address] AT [facility] OF [address] [facility] Phone: [phone]  Fax: [phone]     Please advise

## 2022-02-02 NOTE — Assessment & Plan Note (Signed)
Chronic.  Controlled.  Continue with current medication regimen of Maxide and Benzapril.  Labs ordered today.  Return to clinic in 6 months for reevaluation.  Call sooner if concerns arise.

## 2022-02-02 NOTE — Assessment & Plan Note (Signed)
Chronic.  Controlled.  Continue with current medication regimen of Crestor 5mg.  Labs ordered today.  Return to clinic in 6 months for reevaluation.  Call sooner if concerns arise.   

## 2022-02-03 ENCOUNTER — Other Ambulatory Visit: Payer: Self-pay | Admitting: Nurse Practitioner

## 2022-02-03 LAB — CBC WITH DIFFERENTIAL/PLATELET
Basophils Absolute: 0.1 10*3/uL (ref 0.0–0.2)
Basos: 1 %
EOS (ABSOLUTE): 0.1 10*3/uL (ref 0.0–0.4)
Eos: 1 %
Hematocrit: 51 % (ref 37.5–51.0)
Hemoglobin: 16.6 g/dL (ref 13.0–17.7)
Immature Grans (Abs): 0 10*3/uL (ref 0.0–0.1)
Immature Granulocytes: 0 %
Lymphocytes Absolute: 2.7 10*3/uL (ref 0.7–3.1)
Lymphs: 42 %
MCH: 27.7 pg (ref 26.6–33.0)
MCHC: 32.5 g/dL (ref 31.5–35.7)
MCV: 85 fL (ref 79–97)
Monocytes Absolute: 0.4 10*3/uL (ref 0.1–0.9)
Monocytes: 7 %
Neutrophils Absolute: 3.1 10*3/uL (ref 1.4–7.0)
Neutrophils: 49 %
Platelets: 320 10*3/uL (ref 150–450)
RBC: 5.99 x10E6/uL — ABNORMAL HIGH (ref 4.14–5.80)
RDW: 12.8 % (ref 11.6–15.4)
WBC: 6.4 10*3/uL (ref 3.4–10.8)

## 2022-02-03 LAB — LIPID PANEL
Chol/HDL Ratio: 3.8 ratio (ref 0.0–5.0)
Cholesterol, Total: 180 mg/dL (ref 100–199)
HDL: 47 mg/dL (ref >39)
LDL Chol Calc (NIH): 104 mg/dL — ABNORMAL HIGH (ref 0–99)
Triglycerides: 169 mg/dL — ABNORMAL HIGH (ref 0–149)
VLDL Cholesterol Cal: 29 mg/dL (ref 5–40)

## 2022-02-03 LAB — COMPREHENSIVE METABOLIC PANEL
ALT: 52 IU/L — ABNORMAL HIGH (ref 0–44)
AST: 26 IU/L (ref 0–40)
Albumin/Globulin Ratio: 1.9 (ref 1.2–2.2)
Albumin: 4.7 g/dL (ref 4.1–5.1)
Alkaline Phosphatase: 74 IU/L (ref 44–121)
BUN/Creatinine Ratio: 12 (ref 9–20)
BUN: 14 mg/dL (ref 6–24)
Bilirubin Total: 0.4 mg/dL (ref 0.0–1.2)
CO2: 25 mmol/L (ref 20–29)
Calcium: 10.5 mg/dL — ABNORMAL HIGH (ref 8.7–10.2)
Chloride: 99 mmol/L (ref 96–106)
Creatinine, Ser: 1.15 mg/dL (ref 0.76–1.27)
Globulin, Total: 2.5 g/dL (ref 1.5–4.5)
Glucose: 120 mg/dL — ABNORMAL HIGH (ref 70–99)
Potassium: 4.4 mmol/L (ref 3.5–5.2)
Sodium: 142 mmol/L (ref 134–144)
Total Protein: 7.2 g/dL (ref 6.0–8.5)
eGFR: 83 mL/min/{1.73_m2} (ref >59)

## 2022-02-03 LAB — HEMOGLOBIN A1C
Est. average glucose Bld gHb Est-mCnc: 137 mg/dL
Hgb A1c MFr Bld: 6.4 % — ABNORMAL HIGH (ref 4.8–5.6)

## 2022-02-03 LAB — TSH: TSH: 3.02 u[IU]/mL (ref 0.450–4.500)

## 2022-02-05 MED ORDER — OZEMPIC (0.25 OR 0.5 MG/DOSE) 2 MG/1.5ML ~~LOC~~ SOPN: 0.2500 mg | PEN_INJECTOR | SUBCUTANEOUS | 0 refills | Status: DC

## 2022-02-05 NOTE — Progress Notes (Signed)
Hi Devon Macdonald. It was nice to see you last week.  Your lab work looks good.  A1c did increase to 6.4.  However, the mounjaro and weight loss will help with this.  Triglycerides have improved which is great.  No other concerns at this time. Continue with your current medication regimen.  Follow up as discussed.  Please let me know if you have any questions.

## 2022-02-05 NOTE — Telephone Encounter (Signed)
Requested Prescriptions  Pending Prescriptions Disp Refills   metFORMIN (GLUCOPHAGE) 1000 MG tablet [Pharmacy Med Name: METFORMIN 1000MG TABLETS] 180 tablet 1    Sig: TAKE 1 TABLET(1000 MG) BY MOUTH TWICE DAILY WITH A MEAL     Endocrinology:  Diabetes - Biguanides Failed - 02/03/2022  8:09 AM      Failed - B12 Level in normal range and within 720 days    No results found for: "VITAMINB12"       Failed - CBC within normal limits and completed in the last 12 months    WBC  Date Value Ref Range Status  02/02/2022 6.4 3.4 - 10.8 x10E3/uL Final  08/09/2017 6.1 3.8 - 10.8 Thousand/uL Final   RBC  Date Value Ref Range Status  02/02/2022 5.99 (H) 4.14 - 5.80 x10E6/uL Final  08/09/2017 5.68 4.20 - 5.80 Million/uL Final   Hemoglobin  Date Value Ref Range Status  02/02/2022 16.6 13.0 - 17.7 g/dL Final   Hematocrit  Date Value Ref Range Status  02/02/2022 51.0 37.5 - 51.0 % Final   MCHC  Date Value Ref Range Status  02/02/2022 32.5 31.5 - 35.7 g/dL Final  08/09/2017 33.3 32.0 - 36.0 g/dL Final   [facility]  Date Value Ref Range Status  02/02/2022 27.7 26.6 - 33.0 pg Final  08/09/2017 27.8 27.0 - 33.0 pg Final   MCV  Date Value Ref Range Status  02/02/2022 85 79 - 97 fL Final   No results found for: "PLTCOUNTKUC", "LABPLAT", "POCPLA" RDW  Date Value Ref Range Status  02/02/2022 12.8 11.6 - 15.4 % Final         Passed - Cr in normal range and within 360 days    Creat  Date Value Ref Range Status  08/09/2017 1.25 0.60 - 1.35 mg/dL Final   Creatinine, Ser  Date Value Ref Range Status  02/02/2022 1.15 0.76 - 1.27 mg/dL Final         Passed - HBA1C is between 0 and 7.9 and within 180 days    HbA1c, POC (controlled diabetic range)  Date Value Ref Range Status  06/23/2021 5.8 0.0 - 7.0 % Final   Hgb A1c MFr Bld  Date Value Ref Range Status  02/02/2022 6.4 (H) 4.8 - 5.6 % Final    Comment:             Prediabetes: 5.7 - 6.4          Diabetes: >6.4          Glycemic control  for adults with diabetes: <7.0          Passed - eGFR in normal range and within 360 days    GFR, Est African American  Date Value Ref Range Status  08/09/2017 85 > OR = 60 mL/min/1.2m Final   GFR calc Af Amer  Date Value Ref Range Status  10/28/2019 99 >59 mL/min/1.73 Final    Comment:    **[facility] currently reports eGFR in compliance with the current**   recommendations of the [employer] [facility] will   update reporting as new guidelines are published from the NKF-ASN   Task force.    GFR, Est Non African American  Date Value Ref Range Status  08/09/2017 74 > OR = 60 mL/min/1.766mFinal   GFR calc non Af Amer  Date Value Ref Range Status  10/28/2019 86 >59 mL/min/1.73 Final   eGFR  Date Value Ref Range Status  02/02/2022 83 >59 mL/min/1.73 Final  Passed - Valid encounter within last 6 months    Recent Outpatient Visits           3 days ago Annual physical exam   [facility] Jon Billings, NP   3 months ago Type 2 diabetes mellitus with vascular disease ([place])   [place] Jon Billings, NP       Future Appointments             In 1 month Jon Billings, NP [facility], [place]

## 2022-03-09 NOTE — Progress Notes (Signed)
BP 110/69   Pulse 75   Temp 97.8 F (36.6 C) (Oral)   Wt 195 lb 12.8 oz (88.8 kg)   SpO2 97%   BMI 31.60 kg/m    Subjective:    Patient ID: Devon Saucier., male    DOB: September 16, 1980, 42 y.o.   MRN: [id number]  HPI: Devon Muckey. is a 42 y.o. male  Chief Complaint  Patient presents with   Diabetes   DIABETES Patient states he took some getting used to the medication.  He has done 3 injections.   Hypoglycemic episodes:no Polydipsia/polyuria: no Visual disturbance: no Chest pain: no Paresthesias: no Glucose Monitoring: no  Accucheck frequency: Not Checking  Fasting glucose:  Post prandial:  Evening:  Before meals: Taking Insulin?: no  Long acting insulin:  Short acting insulin:  Relevant past medical, surgical, family and social history reviewed and updated as indicated. Interim medical history since our last visit reviewed. Allergies and medications reviewed and updated.  Review of Systems  Eyes:  Negative for visual disturbance.  Endocrine: Negative for polydipsia and polyuria.  Neurological:  Negative for numbness.    Per HPI unless specifically indicated above     Objective:    BP 110/69   Pulse 75   Temp 97.8 F (36.6 C) (Oral)   Wt 195 lb 12.8 oz (88.8 kg)   SpO2 97%   BMI 31.60 kg/m   Wt Readings from Last 3 Encounters:  03/12/22 195 lb 12.8 oz (88.8 kg)  02/02/22 199 lb 4.8 oz (90.4 kg)  11/02/21 190 lb 8 oz (86.4 kg)    Physical Exam Vitals and nursing note reviewed.  Constitutional:      General: He is not in acute distress.    Appearance: Normal appearance. He is not ill-appearing, toxic-appearing or diaphoretic.  HENT:     Head: Normocephalic.     Right Ear: External ear normal.     Left Ear: External ear normal.     Nose: Nose normal. No congestion or rhinorrhea.     Mouth/Throat:     Mouth: Mucous membranes are moist.  Eyes:     General:        Right eye: No discharge.        Left eye: No discharge.      Extraocular Movements: Extraocular movements intact.     Conjunctiva/sclera: Conjunctivae normal.     Pupils: Pupils are equal, round, and reactive to light.  Cardiovascular:     Rate and Rhythm: Normal rate and regular rhythm.     Heart sounds: No murmur heard. Pulmonary:     Effort: Pulmonary effort is normal. No respiratory distress.     Breath sounds: Normal breath sounds. No wheezing, rhonchi or rales.  Abdominal:     General: Abdomen is flat. Bowel sounds are normal.  Musculoskeletal:     Cervical back: Normal range of motion and neck supple.  Skin:    General: Skin is warm and dry.     Capillary Refill: Capillary refill takes less than 2 seconds.  Neurological:     General: No focal deficit present.     Mental Status: He is alert and oriented to person, place, and time.  Psychiatric:        Mood and Affect: Mood normal.        Behavior: Behavior normal.        Thought Content: Thought content normal.        Judgment: Judgment normal.  Results for orders placed or performed in visit on 02/02/22  Microscopic Examination   Urine  Result Value Ref Range   WBC, UA None seen 0 - 5 /hpf   RBC, Urine 0-2 0 - 2 /hpf   Epithelial Cells (non renal) None seen 0 - 10 /hpf   Bacteria, UA None seen None seen/Few  TSH  Result Value Ref Range   TSH 3.020 0.450 - 4.500 uIU/mL  Lipid panel  Result Value Ref Range   Cholesterol, Total 180 100 - 199 mg/dL   Triglycerides 169 (H) 0 - 149 mg/dL   HDL 47 >39 mg/dL   VLDL Cholesterol Cal 29 5 - 40 mg/dL   LDL Chol Calc (NIH) 104 (H) 0 - 99 mg/dL   Chol/HDL Ratio 3.8 0.0 - 5.0 ratio  CBC with Differential/Platelet  Result Value Ref Range   WBC 6.4 3.4 - 10.8 x10E3/uL   RBC 5.99 (H) 4.14 - 5.80 x10E6/uL   Hemoglobin 16.6 13.0 - 17.7 g/dL   Hematocrit 51.0 37.5 - 51.0 %   MCV 85 79 - 97 fL   MCH 27.7 26.6 - 33.0 pg   MCHC 32.5 31.5 - 35.7 g/dL   RDW 12.8 11.6 - 15.4 %   Platelets 320 150 - 450 x10E3/uL   Neutrophils 49 Not  Estab. %   Lymphs 42 Not Estab. %   Monocytes 7 Not Estab. %   Eos 1 Not Estab. %   Basos 1 Not Estab. %   Neutrophils Absolute 3.1 1.4 - 7.0 x10E3/uL   Lymphocytes Absolute 2.7 0.7 - 3.1 x10E3/uL   Monocytes Absolute 0.4 0.1 - 0.9 x10E3/uL   EOS (ABSOLUTE) 0.1 0.0 - 0.4 x10E3/uL   Basophils Absolute 0.1 0.0 - 0.2 x10E3/uL   Immature Granulocytes 0 Not Estab. %   Immature Grans (Abs) 0.0 0.0 - 0.1 x10E3/uL  Comprehensive metabolic panel  Result Value Ref Range   Glucose 120 (H) 70 - 99 mg/dL   BUN 14 6 - 24 mg/dL   Creatinine, Ser 1.15 0.76 - 1.27 mg/dL   eGFR 83 >59 mL/min/1.73   BUN/Creatinine Ratio 12 9 - 20   Sodium 142 134 - 144 mmol/L   Potassium 4.4 3.5 - 5.2 mmol/L   Chloride 99 96 - 106 mmol/L   CO2 25 20 - 29 mmol/L   Calcium 10.5 (H) 8.7 - 10.2 mg/dL   Total Protein 7.2 6.0 - 8.5 g/dL   Albumin 4.7 4.1 - 5.1 g/dL   Globulin, Total 2.5 1.5 - 4.5 g/dL   Albumin/Globulin Ratio 1.9 1.2 - 2.2   Bilirubin Total 0.4 0.0 - 1.2 mg/dL   Alkaline Phosphatase 74 44 - 121 IU/L   AST 26 0 - 40 IU/L   ALT 52 (H) 0 - 44 IU/L  Urinalysis, Routine w reflex microscopic  Result Value Ref Range   Specific Gravity, UA 1.015 1.005 - 1.030   pH, UA 5.0 5.0 - 7.5   Color, UA Yellow Yellow   Appearance Ur Clear Clear   Leukocytes,UA Negative Negative   Protein,UA Negative Negative/Trace   Glucose, UA 3+ (A) Negative   Ketones, UA Trace (A) Negative   RBC, UA Trace (A) Negative   Bilirubin, UA Negative Negative   Urobilinogen, Ur 0.2 0.2 - 1.0 mg/dL   Nitrite, UA Negative Negative   Microscopic Examination See below:   HgB A1c  Result Value Ref Range   Hgb A1c MFr Bld 6.4 (H) 4.8 - 5.6 %   Est.  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 Patient started on Ozempic after last visit.  Doing well and  tolerating medication well.  Has lost 4lbs since last visit.  Will increase dose to .5mg  weekly.  Continue with low carb diet and exercise.  Follow up in 2 months.  Call sooner if concerns arise. Microalbumin collected today.       Relevant Medications   Semaglutide,0.25 or 0.5MG /DOS, (OZEMPIC, 0.25 OR 0.5 MG/DOSE,) 2 MG/1.5ML SOPN   Other Relevant Orders   Microalbumin, Urine Waived     Follow up plan: Return in about 2 months (around 05/11/2022) for HTN, HLD, DM2 FU.

## 2022-03-12 ENCOUNTER — Encounter: Payer: Self-pay | Admitting: Nurse Practitioner

## 2022-03-12 ENCOUNTER — Ambulatory Visit: Payer: BC Managed Care – PPO | Admitting: Nurse Practitioner

## 2022-03-12 VITALS — BP 110/69 | HR 75 | Temp 97.8°F | Wt 195.8 lb

## 2022-03-12 DIAGNOSIS — E1159 Type 2 diabetes mellitus with other circulatory complications: Secondary | ICD-10-CM

## 2022-03-12 LAB — MICROALBUMIN, URINE WAIVED
Creatinine, Urine Waived: 200 mg/dL (ref 10–300)
Microalb, Ur Waived: 80 mg/L — ABNORMAL HIGH (ref 0–19)

## 2022-03-12 MED ORDER — OZEMPIC (0.25 OR 0.5 MG/DOSE) 2 MG/1.5ML ~~LOC~~ SOPN: 0.5000 mg | PEN_INJECTOR | SUBCUTANEOUS | 1 refills | Status: DC

## 2022-03-12 NOTE — Assessment & Plan Note (Signed)
Chronic. Patient started on Ozempic after last visit.  Doing well and tolerating medication well.  Has lost 4lbs since last visit.  Will increase dose to .5mg  weekly.  Continue with low carb diet and exercise.  Follow up in 2 months.  Call sooner if concerns arise. Microalbumin collected today.

## 2022-04-09 ENCOUNTER — Other Ambulatory Visit: Payer: Self-pay | Admitting: Nurse Practitioner

## 2022-04-10 ENCOUNTER — Encounter: Payer: Self-pay | Admitting: Nurse Practitioner

## 2022-04-11 NOTE — Telephone Encounter (Signed)
Unable to refill per protocol, Rx request is too soon. Last refill 11/22/21 for 90 days.  Requested Prescriptions  Pending Prescriptions Disp Refills   JARDIANCE 10 MG TABS tablet [Pharmacy Med Name: JARDIANCE 10MG TABLETS] 90 tablet 1    Sig: TAKE 1 TABLET BY MOUTH EVERY DAY BEFORE BREAKFAST     Endocrinology:  Diabetes - SGLT2 Inhibitors Passed - 04/09/2022  8:41 PM      Passed - Cr in normal range and within 360 days    Creat  Date Value Ref Range Status  08/09/2017 1.25 0.60 - 1.35 mg/dL Final   Creatinine, Ser  Date Value Ref Range Status  02/02/2022 1.15 0.76 - 1.27 mg/dL Final         Passed - HBA1C is between 0 and 7.9 and within 180 days    HbA1c, POC (controlled diabetic range)  Date Value Ref Range Status  06/23/2021 5.8 0.0 - 7.0 % Final   Hgb A1c MFr Bld  Date Value Ref Range Status  02/02/2022 6.4 (H) 4.8 - 5.6 % Final    Comment:             Prediabetes: 5.7 - 6.4          Diabetes: >6.4          Glycemic control for adults with diabetes: <7.0          Passed - eGFR in normal range and within 360 days    GFR, Est African American  Date Value Ref Range Status  08/09/2017 85 > OR = 60 mL/min/1.32m Final   GFR calc Af Amer  Date Value Ref Range Status  10/28/2019 99 >59 mL/min/1.73 Final    Comment:    **[facility] currently reports eGFR in compliance with the current**   recommendations of the [employer] [facility] will   update reporting as new guidelines are published from the NKF-ASN   Task force.    GFR, Est Non African American  Date Value Ref Range Status  08/09/2017 74 > OR = 60 mL/min/1.797mFinal   GFR calc non Af Amer  Date Value Ref Range Status  10/28/2019 86 >59 mL/min/1.73 Final   eGFR  Date Value Ref Range Status  02/02/2022 83 >59 mL/min/1.73 Final         Passed - Valid encounter within last 6 months    Recent Outpatient Visits           1 month ago Type 2 diabetes mellitus with vascular disease ([place]   2 months ago Annual physical exam   [place]   5 months [facility]  [place]       Future Appointments             In 1 month [place]

## 2022-04-18 ENCOUNTER — Encounter: Payer: Self-pay | Admitting: Nurse Practitioner

## 2022-04-27 MED ORDER — MONTELUKAST SODIUM 10 MG PO TABS: ORAL_TABLET | ORAL | 1 refills | Status: DC

## 2022-05-07 ENCOUNTER — Other Ambulatory Visit: Payer: Self-pay | Admitting: Nurse Practitioner

## 2022-05-08 NOTE — Telephone Encounter (Signed)
Requested Prescriptions  Pending Prescriptions Disp Refills   OZEMPIC, 0.25 OR 0.5 MG/DOSE, 2 MG/3ML SOPN [Pharmacy Med Name: OZEMPIC 0.25 OR 0.5MG /DOS1X2MG  3ML] 3 mL 1    Sig: INJECT 0.5 MG INTO THE SKIN ONCE A WEEK     Endocrinology:  Diabetes - GLP-1 Receptor Agonists - semaglutide Failed - 05/07/2022 11:52 AM      Failed - HBA1C in normal range and within 180 days    HbA1c, POC (controlled diabetic range)  Date Value Ref Range Status  06/23/2021 5.8 0.0 - 7.0 % Final   Hgb A1c MFr Bld  Date Value Ref Range Status  02/02/2022 6.4 (H) 4.8 - 5.6 % Final    Comment:             Prediabetes: 5.7 - 6.4          Diabetes: >6.4          Glycemic control for adults with diabetes: <7.0          Passed - Cr in normal range and within 360 days    Creat  Date Value Ref Range Status  08/09/2017 1.25 0.60 - 1.35 mg/dL Final   Creatinine, Ser  Date Value Ref Range Status  02/02/2022 1.15 0.76 - 1.27 mg/dL Final         Passed - Valid encounter within last 6 months    Recent Outpatient Visits           1 month ago Type 2 diabetes mellitus with vascular disease ([place])   [place], Karen, NP   3 months ago Annual physical exam   [place] Jon Billings, NP   6 months ago Type 2 diabetes mellitus with vascular disease ([place])   [place] Jon Billings, NP       Future Appointments             In 1 week Jon Billings, NP [place], [facility]

## 2022-05-16 NOTE — Progress Notes (Unsigned)
   There were no vitals taken for this visit.   Subjective:    Patient ID: Devon Macdonald., male    DOB: 05/26/1980, 42 y.o.   MRN: [id number]  HPI: Devon Macdonald. is a 42 y.o. male  No chief complaint on file.  HYPERTENSION / HYPERLIPIDEMIA Satisfied with current treatment? {Blank single:19197::"yes","no"} Duration of hypertension: {Blank single:19197::"chronic","months","years"} BP monitoring frequency: {Blank single:19197::"not checking","rarely","daily","weekly","monthly","a few times a day","a few times a week","a few times a month"} BP range:  BP medication side effects: {Blank single:19197::"yes","no"} Past BP meds: {Blank A999333 (bystolic)","carvedilol","chlorthalidone","clonidine","diltiazem","exforge HCT","HCTZ","irbesartan (avapro)","labetalol","lisinopril","lisinopril-HCTZ","losartan (cozaar)","methyldopa","nifedipine","olmesartan (benicar)","olmesartan-HCTZ","quinapril","ramipril","spironalactone","tekturna","valsartan","valsartan-HCTZ","verapamil"} Duration of hyperlipidemia: {Blank single:19197::"chronic","months","years"} Cholesterol medication side effects: {Blank single:19197::"yes","no"} Cholesterol supplements: {Blank multiple:19196::"none","fish oil","niacin","red yeast rice"} Past cholesterol medications: {Blank multiple:19196::"none","atorvastain (lipitor)","lovastatin (mevacor)","pravastatin (pravachol)","rosuvastatin (crestor)","simvastatin (zocor)","vytorin","fenofibrate (tricor)","gemfibrozil","ezetimide (zetia)","niaspan","lovaza"} Medication compliance: {Blank single:19197::"excellent compliance","good compliance","fair compliance","poor compliance"} Aspirin: {Blank single:19197::"yes","no"} Recent stressors: {Blank single:19197::"yes","no"} Recurrent headaches: {Blank single:19197::"yes","no"} Visual changes: {Blank  single:19197::"yes","no"} Palpitations: {Blank single:19197::"yes","no"} Dyspnea: {Blank single:19197::"yes","no"} Chest pain: {Blank single:19197::"yes","no"} Lower extremity edema: {Blank single:19197::"yes","no"} Dizzy/lightheaded: {Blank single:19197::"yes","no"}  DIABETES Hypoglycemic episodes:{Blank single:19197::"yes","no"} Polydipsia/polyuria: {Blank single:19197::"yes","no"} Visual disturbance: {Blank single:19197::"yes","no"} Chest pain: {Blank single:19197::"yes","no"} Paresthesias: {Blank single:19197::"yes","no"} Glucose Monitoring: {Blank single:19197::"yes","no"}  Accucheck frequency: {Blank single:19197::"Not Checking","Daily","BID","TID"}  Fasting glucose:  Post prandial:  Evening:  Before meals: Taking Insulin?: {Blank single:19197::"yes","no"}  Long acting insulin:  Short acting insulin: Blood Pressure Monitoring: {Blank single:19197::"not checking","rarely","daily","weekly","monthly","a few times a day","a few times a week","a few times a month"} Retinal Examination: {Blank single:19197::"Up to Date","Not up to Date"} Foot Exam: {Blank single:19197::"Up to Date","Not up to Date"} Diabetic Education: {Blank single:19197::"Completed","Not Completed"} Pneumovax: {Blank single:19197::"Up to Date","Not up to Date","unknown"} Influenza: {Blank single:19197::"Up to Date","Not up to Date","unknown"} Aspirin: {Blank single:19197::"yes","no"}  Relevant past medical, surgical, family and social history reviewed and updated as indicated. Interim medical history since our last visit reviewed. Allergies and medications reviewed and updated.  Review of Systems  Per HPI unless specifically indicated above     Objective:    There were no vitals taken for this visit.  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

## 2022-05-17 ENCOUNTER — Encounter: Payer: Self-pay | Admitting: Nurse Practitioner

## 2022-05-17 ENCOUNTER — Ambulatory Visit: Payer: BC Managed Care – PPO | Admitting: Nurse Practitioner

## 2022-05-17 VITALS — BP 112/71 | HR 81 | Temp 97.7°F | Wt 185.1 lb

## 2022-05-17 DIAGNOSIS — E1159 Type 2 diabetes mellitus with other circulatory complications: Secondary | ICD-10-CM | POA: Diagnosis not present

## 2022-05-17 DIAGNOSIS — E669 Obesity, unspecified: Secondary | ICD-10-CM

## 2022-05-17 DIAGNOSIS — E1169 Type 2 diabetes mellitus with other specified complication: Secondary | ICD-10-CM | POA: Diagnosis not present

## 2022-05-17 DIAGNOSIS — I152 Hypertension secondary to endocrine disorders: Secondary | ICD-10-CM

## 2022-05-17 DIAGNOSIS — E785 Hyperlipidemia, unspecified: Secondary | ICD-10-CM

## 2022-05-17 MED ORDER — SEMAGLUTIDE (1 MG/DOSE) 4 MG/3ML ~~LOC~~ SOPN: 1.0000 mg | PEN_INJECTOR | SUBCUTANEOUS | 1 refills | Status: DC

## 2022-05-17 NOTE — Assessment & Plan Note (Signed)
Chronic.  Controlled.  Continue with current medication regimen of Maxide and Benzapril.  Labs ordered today.  Return to clinic in 3 months for reevaluation.  Call sooner if concerns arise.

## 2022-05-17 NOTE — Assessment & Plan Note (Signed)
Improved.  Has lost 10lbs since last visit.  Continue with weight loss journey.

## 2022-05-17 NOTE — Assessment & Plan Note (Signed)
Chronic.  Controlled.  Continue with current medication regimen of Crestor 5mg.  Labs ordered today.  Return to clinic in 6 months for reevaluation.  Call sooner if concerns arise.   

## 2022-05-17 NOTE — Assessment & Plan Note (Signed)
Chronic. Patient doing well with Ozempic.  Has lost 10lbs since last visit.  Will increase to 1mg  dose.  Doing well and tolerating medication well.   Continue with low carb diet and exercise.  Follow up in 3 months.  Call sooner if concerns arise.

## 2022-05-18 LAB — COMPREHENSIVE METABOLIC PANEL
ALT: 75 IU/L — ABNORMAL HIGH (ref 0–44)
AST: 37 IU/L (ref 0–40)
Albumin/Globulin Ratio: 1.7 (ref 1.2–2.2)
Albumin: 4.8 g/dL (ref 4.1–5.1)
Alkaline Phosphatase: 97 IU/L (ref 44–121)
BUN/Creatinine Ratio: 9 (ref 9–20)
BUN: 11 mg/dL (ref 6–24)
Bilirubin Total: 1.1 mg/dL (ref 0.0–1.2)
CO2: 25 mmol/L (ref 20–29)
Calcium: 10.2 mg/dL (ref 8.7–10.2)
Chloride: 94 mmol/L — ABNORMAL LOW (ref 96–106)
Creatinine, Ser: 1.22 mg/dL (ref 0.76–1.27)
Globulin, Total: 2.8 g/dL (ref 1.5–4.5)
Glucose: 108 mg/dL — ABNORMAL HIGH (ref 70–99)
Potassium: 4.3 mmol/L (ref 3.5–5.2)
Sodium: 137 mmol/L (ref 134–144)
Total Protein: 7.6 g/dL (ref 6.0–8.5)
eGFR: 77 mL/min/{1.73_m2} (ref >59)

## 2022-05-18 LAB — LIPID PANEL
Chol/HDL Ratio: 3.6 ratio (ref 0.0–5.0)
Cholesterol, Total: 177 mg/dL (ref 100–199)
HDL: 49 mg/dL (ref >39)
LDL Chol Calc (NIH): 105 mg/dL — ABNORMAL HIGH (ref 0–99)
Triglycerides: 132 mg/dL (ref 0–149)
VLDL Cholesterol Cal: 23 mg/dL (ref 5–40)

## 2022-05-18 LAB — HEMOGLOBIN A1C
Est. average glucose Bld gHb Est-mCnc: 126 mg/dL
Hgb A1c MFr Bld: 6 % — ABNORMAL HIGH (ref 4.8–5.6)

## 2022-05-18 NOTE — Progress Notes (Signed)
Hi Devon Macdonald. It was nice to see you yesterday.  Your lab work looks good.  Your A1c is in the prediabetic range at 6.0%.  Keep up the great work!  No concerns at this time. Continue with your current medication regimen.  Follow up as discussed.  Please let me know if you have any questions.

## 2022-06-15 ENCOUNTER — Encounter: Payer: Self-pay | Admitting: Nurse Practitioner

## 2022-06-18 MED ORDER — EMPAGLIFLOZIN 10 MG PO TABS: ORAL_TABLET | ORAL | 1 refills | Status: DC

## 2022-06-21 ENCOUNTER — Other Ambulatory Visit: Payer: Self-pay | Admitting: Family Medicine

## 2022-06-21 DIAGNOSIS — I10 Essential (primary) hypertension: Secondary | ICD-10-CM

## 2022-07-11 MED ORDER — BENAZEPRIL HCL 5 MG PO TABS: 5.0000 mg | ORAL_TABLET | Freq: Every day | ORAL | 1 refills | Status: DC

## 2022-07-26 ENCOUNTER — Encounter: Payer: Self-pay | Admitting: Nurse Practitioner

## 2022-08-21 ENCOUNTER — Ambulatory Visit: Payer: BC Managed Care – PPO | Admitting: Nurse Practitioner

## 2022-08-21 ENCOUNTER — Encounter: Payer: Self-pay | Admitting: Nurse Practitioner

## 2022-08-21 VITALS — BP 108/69 | HR 80 | Temp 98.6°F | Wt 183.4 lb

## 2022-08-21 DIAGNOSIS — E1169 Type 2 diabetes mellitus with other specified complication: Secondary | ICD-10-CM

## 2022-08-21 DIAGNOSIS — I152 Hypertension secondary to endocrine disorders: Secondary | ICD-10-CM | POA: Diagnosis not present

## 2022-08-21 DIAGNOSIS — E1159 Type 2 diabetes mellitus with other circulatory complications: Secondary | ICD-10-CM | POA: Diagnosis not present

## 2022-08-21 DIAGNOSIS — Z7985 Long-term (current) use of injectable non-insulin antidiabetic drugs: Secondary | ICD-10-CM

## 2022-08-21 DIAGNOSIS — E669 Obesity, unspecified: Secondary | ICD-10-CM | POA: Diagnosis not present

## 2022-08-21 DIAGNOSIS — E785 Hyperlipidemia, unspecified: Secondary | ICD-10-CM

## 2022-08-21 DIAGNOSIS — E66811 Obesity, class 1: Secondary | ICD-10-CM

## 2022-08-21 MED ORDER — EMPAGLIFLOZIN 10 MG PO TABS: ORAL_TABLET | ORAL | 1 refills | Status: DC

## 2022-08-21 MED ORDER — METFORMIN HCL 1000 MG PO TABS: ORAL_TABLET | ORAL | 1 refills | Status: DC

## 2022-08-21 MED ORDER — SEMAGLUTIDE (1 MG/DOSE) 4 MG/3ML ~~LOC~~ SOPN: 1.0000 mg | PEN_INJECTOR | SUBCUTANEOUS | 1 refills | Status: DC

## 2022-08-21 MED ORDER — MONTELUKAST SODIUM 10 MG PO TABS: ORAL_TABLET | ORAL | 1 refills | Status: DC

## 2022-08-21 MED ORDER — BENAZEPRIL HCL 5 MG PO TABS: 5.0000 mg | ORAL_TABLET | Freq: Every day | ORAL | 1 refills | Status: DC

## 2022-08-21 NOTE — Progress Notes (Signed)
BP 108/69   Pulse 80   Temp 98.6 F (37 C) (Oral)   Wt 183 lb 6.4 oz (83.2 kg)   SpO2 98%   BMI 29.60 kg/m    Subjective:    Patient ID: Devon Macdonald., male    DOB: [birth year], 42 y.o.   MRN: [id number]  HPI: Devon Macdonald. is a 42 y.o. male  Chief Complaint  Patient presents with   Diabetes   Hyperlipidemia   Hypertension   HYPERTENSION / HYPERLIPIDEMIA Satisfied with current treatment? yes Duration of hypertension: years BP monitoring frequency: not checking BP range:  BP medication side effects: no Past BP meds: benazepril, Triamterene, HCTZ Duration of hyperlipidemia: years Cholesterol medication side effects: no Cholesterol supplements: none Past cholesterol medications: rosuvastatin (crestor) Medication compliance:  excellent compliance Aspirin: no Recent stressors: no Recurrent headaches: no Visual changes: no Palpitations: no Dyspnea: no Chest pain: no Lower extremity edema: no Dizzy/lightheaded: no  DIABETES Doing well with Jardiance, metformin and Ozempic.  He has lost another two pounds.  Hypoglycemic episodes:no Polydipsia/polyuria: no Visual disturbance: no Chest pain: no Paresthesias: no Glucose Monitoring: no  Accucheck frequency: Not Checking  Fasting glucose:  Post prandial:  Evening:  Before meals: Taking Insulin?: no  Long acting insulin:  Short acting insulin: Blood Pressure Monitoring: not checking Retinal Examination: Up to Date Due in August Foot Exam: Up to Date Diabetic Education: Not Completed Pneumovax: Up to Date Influenza: Up to Date Aspirin: no  Relevant past medical, surgical, family and social history reviewed and updated as indicated. Interim medical history since our last visit reviewed. Allergies and medications reviewed and updated.  Review of Systems  Eyes:  Negative for visual disturbance.  Respiratory:  Negative for chest tightness and shortness of breath.   Cardiovascular:  Negative for  chest pain, palpitations and leg swelling.  Endocrine: Negative for polydipsia and polyuria.  Neurological:  Negative for dizziness, light-headedness, numbness and headaches.    Per HPI unless specifically indicated above     Objective:    BP 108/69   Pulse 80   Temp 98.6 F (37 C) (Oral)   Wt 183 lb 6.4 oz (83.2 kg)   SpO2 98%   BMI 29.60 kg/m   Wt Readings from Last 3 Encounters:  08/21/22 183 lb 6.4 oz (83.2 kg)  05/17/22 185 lb 1.6 oz (84 kg)  03/12/22 195 lb 12.8 oz (88.8 kg)    Physical Exam Vitals and nursing note reviewed.  Constitutional:      General: He is not in acute distress.    Appearance: Normal appearance. He is not ill-appearing, toxic-appearing or diaphoretic.  HENT:     Head: Normocephalic.     Right Ear: External ear normal.     Left Ear: External ear normal.     Nose: Nose normal. No congestion or rhinorrhea.     Mouth/Throat:     Mouth: Mucous membranes are moist.  Eyes:     General:        Right eye: No discharge.        Left eye: No discharge.     Extraocular Movements: Extraocular movements intact.     Conjunctiva/sclera: Conjunctivae normal.     Pupils: Pupils are equal, round, and reactive to light.  Cardiovascular:     Rate and Rhythm: Normal rate and regular rhythm.     Heart sounds: No murmur heard. Pulmonary:     Effort: Pulmonary effort is normal. No respiratory distress.  Breath sounds: Normal breath sounds. No wheezing, rhonchi or rales.  Abdominal:     General: Abdomen is flat. Bowel sounds are normal.  Musculoskeletal:     Cervical back: Normal range of motion and neck supple.  Skin:    General: Skin is warm and dry.     Capillary Refill: Capillary refill takes less than 2 seconds.  Neurological:     General: No focal deficit present.     Mental Status: He is alert and oriented to person, place, and time.  Psychiatric:        Mood and Affect: Mood normal.        Behavior: Behavior normal.        Thought Content:  Thought content normal.        Judgment: Judgment normal.     Results for orders placed or performed in visit on 05/17/22  Comp Met (CMET)  Result Value Ref Range   Glucose 108 (H) 70 - 99 mg/dL   BUN 11 6 - 24 mg/dL   Creatinine, Ser 2.13 0.76 - 1.27 mg/dL   eGFR 77 >08 MV/HQI/6.96   BUN/Creatinine Ratio 9 9 - 20   Sodium 137 134 - 144 mmol/L   Potassium 4.3 3.5 - 5.2 mmol/L   Chloride 94 (L) 96 - 106 mmol/L   CO2 25 20 - 29 mmol/L   Calcium 10.2 8.7 - 10.2 mg/dL   Total Protein 7.6 6.0 - 8.5 g/dL   Albumin 4.8 4.1 - 5.1 g/dL   Globulin, Total 2.8 1.5 - 4.5 g/dL   Albumin/Globulin Ratio 1.7 1.2 - 2.2   Bilirubin Total 1.1 0.0 - 1.2 mg/dL   Alkaline Phosphatase 97 44 - 121 IU/L   AST 37 0 - 40 IU/L   ALT 75 (H) 0 - 44 IU/L  HgB A1c  Result Value Ref Range   Hgb A1c MFr Bld 6.0 (H) 4.8 - 5.6 %   Est. average glucose Bld gHb Est-mCnc 126 mg/dL  Lipid Profile  Result Value Ref Range   Cholesterol, Total 177 100 - 199 mg/dL   Triglycerides 295 0 - 149 mg/dL   HDL 49 >28 mg/dL   VLDL Cholesterol Cal 23 5 - 40 mg/dL   LDL Chol Calc (NIH) 413 (H) 0 - 99 mg/dL   Chol/HDL Ratio 3.6 0.0 - 5.0 ratio      Assessment & Plan:   Problem List Items Addressed This Visit       Cardiovascular and Mediastinum   Hypertension associated with type 2 diabetes mellitus (HCC)    Chronic.  Controlled.  Continue with current medication regimen of Maxide and Benzapril.  Labs ordered today.  Return to clinic in 6 months for reevaluation.  Call sooner if concerns arise.        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 Patient doing well with Ozempic.  Has lost 2lbs since last visit.  Continue with Ozempic, Metformin and Jardiance.  Doing  well and tolerating medication well.   Continue with low carb diet and exercise.  Follow up in 6 months.  Call sooner if concerns arise.       Relevant Medications   benazepril (LOTENSIN) 5 MG tablet  empagliflozin (JARDIANCE) 10 MG TABS tablet   metFORMIN (GLUCOPHAGE) 1000 MG tablet   Semaglutide, 1 MG/DOSE, 4 MG/3ML SOPN   Other Relevant Orders   Comp Met (CMET)     Endocrine   Hyperlipidemia associated with type 2 diabetes mellitus (HCC)    Chronic.  Controlled.  Continue with current medication regimen of Crestor 20mg  daily.  Refills sent today.  Labs ordered today.  Return to clinic in 6 months for reevaluation.  Call sooner if concerns arise.        Relevant Medications   benazepril (LOTENSIN) 5 MG tablet   empagliflozin (JARDIANCE) 10 MG TABS tablet   metFORMIN (GLUCOPHAGE) 1000 MG tablet   Semaglutide, 1 MG/DOSE, 4 MG/3ML SOPN   Other Relevant Orders   Lipid Profile     Other   Obesity (BMI 30.0-34.9)    Recommended eating smaller high protein, low fat meals more frequently and exercising 30 mins a day 5 times a week with a goal of 10-15lb weight loss in the next 3 months.         Follow up plan: Return in about 6 months (around 02/21/2023) for HTN, HLD, DM2 FU.

## 2022-08-21 NOTE — Assessment & Plan Note (Signed)
Recommended eating smaller high protein, low fat meals more frequently and exercising 30 mins a day 5 times a week with a goal of 10-15lb weight loss in the next 3 months.  

## 2022-08-21 NOTE — Assessment & Plan Note (Signed)
Chronic.  Controlled.  Continue with current medication regimen of Maxide and Benzapril.  Labs ordered today.  Return to clinic in 6 months for reevaluation.  Call sooner if concerns arise.   

## 2022-08-21 NOTE — Assessment & Plan Note (Signed)
Chronic.  Controlled.  Continue with current medication regimen of Crestor 20mg daily.  Refills sent today.  Labs ordered today.  Return to clinic in 6 months for reevaluation.  Call sooner if concerns arise.    

## 2022-08-21 NOTE — Assessment & Plan Note (Signed)
Chronic. Patient doing well with Ozempic.  Has lost 2lbs since last visit.  Continue with Ozempic, Metformin and Jardiance.  Doing well and tolerating medication well.   Continue with low carb diet and exercise.  Follow up in 6 months.  Call sooner if concerns arise.

## 2022-08-22 LAB — COMPREHENSIVE METABOLIC PANEL
ALT: 48 IU/L — ABNORMAL HIGH (ref 0–44)
AST: 19 IU/L (ref 0–40)
Albumin: 4.4 g/dL (ref 4.1–5.1)
Alkaline Phosphatase: 81 IU/L (ref 44–121)
BUN/Creatinine Ratio: 9 (ref 9–20)
BUN: 10 mg/dL (ref 6–24)
Bilirubin Total: 0.9 mg/dL (ref 0.0–1.2)
CO2: 26 mmol/L (ref 20–29)
Calcium: 9.6 mg/dL (ref 8.7–10.2)
Chloride: 99 mmol/L (ref 96–106)
Creatinine, Ser: 1.13 mg/dL (ref 0.76–1.27)
Globulin, Total: 2.3 g/dL (ref 1.5–4.5)
Glucose: 89 mg/dL (ref 70–99)
Potassium: 3.6 mmol/L (ref 3.5–5.2)
Sodium: 140 mmol/L (ref 134–144)
Total Protein: 6.7 g/dL (ref 6.0–8.5)
eGFR: 84 mL/min/{1.73_m2} (ref >59)

## 2022-08-22 LAB — LIPID PANEL
Chol/HDL Ratio: 3.3 ratio (ref 0.0–5.0)
Cholesterol, Total: 139 mg/dL (ref 100–199)
HDL: 42 mg/dL (ref >39)
LDL Chol Calc (NIH): 73 mg/dL (ref 0–99)
Triglycerides: 136 mg/dL (ref 0–149)
VLDL Cholesterol Cal: 24 mg/dL (ref 5–40)

## 2022-08-22 LAB — HEMOGLOBIN A1C
Est. average glucose Bld gHb Est-mCnc: 123 mg/dL
Hgb A1c MFr Bld: 5.9 % — ABNORMAL HIGH (ref 4.8–5.6)

## 2022-08-22 NOTE — Progress Notes (Signed)
Hi Devon Macdonald. It was nice to see you yesterday.  Your lab work looks good.  Your A1c remains well controlled at 5.9%.  Keep up the great work.  No concerns at this time. Continue with your current medication regimen.  Follow up as discussed.  Please let me know if you have any questions.

## 2022-09-24 ENCOUNTER — Other Ambulatory Visit: Payer: Self-pay | Admitting: Family Medicine

## 2022-09-24 DIAGNOSIS — I10 Essential (primary) hypertension: Secondary | ICD-10-CM

## 2022-09-26 ENCOUNTER — Other Ambulatory Visit: Payer: Self-pay | Admitting: Family Medicine

## 2022-09-26 ENCOUNTER — Encounter: Payer: Self-pay | Admitting: Nurse Practitioner

## 2022-09-26 DIAGNOSIS — I10 Essential (primary) hypertension: Secondary | ICD-10-CM

## 2022-09-27 ENCOUNTER — Other Ambulatory Visit: Payer: Self-pay | Admitting: Family Medicine

## 2022-09-27 ENCOUNTER — Encounter: Payer: Self-pay | Admitting: Family Medicine

## 2022-09-27 ENCOUNTER — Other Ambulatory Visit: Payer: Self-pay | Admitting: Nurse Practitioner

## 2022-09-27 DIAGNOSIS — I10 Essential (primary) hypertension: Secondary | ICD-10-CM

## 2022-09-27 NOTE — Telephone Encounter (Signed)
Medication Refill -  triamterene-hydrochlorothiazide (MAXZIDE) 75-50 MG tablet [[id number]]  Historical Provider Has the patient contacted their pharmacy? Yes.   (Agent: If no, request that the patient contact the pharmacy for the refill. If patient does not wish to contact the pharmacy document the reason why and proceed with request.) (Agent: If yes, when and what did the pharmacy advise?)  Preferred Pharmacy (with phone number or street name):  [id number] - [place], [place] - [address] Phone: [phone]  Fax: [phone]     Has the patient been seen for an appointment in the last year OR does the patient have an upcoming appointment? Yes.  Apt scheduled for Monday  Agent: Please be advised that RX refills may take up to 3 business days. We ask that you follow-up with your pharmacy.

## 2022-09-28 NOTE — Telephone Encounter (Signed)
Requested medication (s) are due for refill today: Yes  Requested medication (s) are on the active medication list: Yes  Last refill:  12/19/21  Future visit scheduled: Yes  Notes to clinic:  Last filled by Dr. Lodema Hong.    Requested Prescriptions  Pending Prescriptions Disp Refills   triamterene-hydrochlorothiazide (MAXZIDE) 75-50 MG tablet 90 tablet 1    Sig: Take 1 tablet by mouth daily.     Cardiovascular: Diuretic Combos Passed - 09/28/2022  8:01 AM      Passed - K in normal range and within 180 days    Potassium  Date Value Ref Range Status  08/21/2022 3.6 3.5 - 5.2 mmol/L Final         Passed - Na in normal range and within 180 days    Sodium  Date Value Ref Range Status  08/21/2022 140 134 - 144 mmol/L Final         Passed - Cr in normal range and within 180 days    Creat  Date Value Ref Range Status  08/09/2017 1.25 0.60 - 1.35 mg/dL Final   Creatinine, Ser  Date Value Ref Range Status  08/21/2022 1.13 0.76 - 1.27 mg/dL Final         Passed - Last BP in normal range    BP Readings from Last 1 Encounters:  08/21/22 108/69         Passed - Valid encounter within last 6 months    Recent Outpatient Visits           1 month ago Type 2 diabetes mellitus with vascular disease (HCC)   [place] [facility] Larae Grooms, NP   4 months ago Type 2 diabetes mellitus with vascular disease (HCC)   [place] [facility] Larae Grooms, NP   6 months ago Type 2 diabetes mellitus with vascular disease (HCC)   [facility] Larae Grooms, NP   7 months ago Annual physical exam   [place] [facility] Larae Grooms, NP   11 months ago Type 2 diabetes mellitus with vascular disease (HCC)   [facility] Larae Grooms, NP       Future Appointments             In 3 days Mecum, Oswaldo Conroy, PA-C [employer] [facility]   In 4 months Larae Grooms, NP [place] [facility], [facility]

## 2022-09-29 LAB — HM DIABETES EYE EXAM

## 2022-10-01 ENCOUNTER — Telehealth (INDEPENDENT_AMBULATORY_CARE_PROVIDER_SITE_OTHER): Payer: BC Managed Care – PPO | Admitting: Physician Assistant

## 2022-10-01 VITALS — BP 120/75

## 2022-10-01 DIAGNOSIS — M25511 Pain in right shoulder: Secondary | ICD-10-CM

## 2022-10-01 DIAGNOSIS — E1159 Type 2 diabetes mellitus with other circulatory complications: Secondary | ICD-10-CM

## 2022-10-01 DIAGNOSIS — Z7984 Long term (current) use of oral hypoglycemic drugs: Secondary | ICD-10-CM | POA: Diagnosis not present

## 2022-10-01 DIAGNOSIS — I152 Hypertension secondary to endocrine disorders: Secondary | ICD-10-CM

## 2022-10-01 MED ORDER — TRIAMTERENE-HCTZ 75-50 MG PO TABS
1.0000 | ORAL_TABLET | Freq: Every day | ORAL | 1 refills | Status: DC
Start: 2022-10-01 — End: 2023-03-26

## 2022-10-01 MED ORDER — MELOXICAM 7.5 MG PO TABS: 7.5000 mg | ORAL_TABLET | Freq: Every day | ORAL | 0 refills | Status: DC

## 2022-10-01 NOTE — Progress Notes (Signed)
Virtual Visit via Video Note  I connected with Devon Macdonald. on 10/01/22 at  1:40 PM EDT by a video enabled telemedicine application and verified that I am speaking with the correct person using two identifiers.   Today's Provider: Jacquelin Hawking, MHS, PA-C Introduced myself to the patient as a PA-C and provided education on APPs in clinical practice.    Location:  Patient: at home  Provider: [facility], [place], [place]    I discussed the limitations of evaluation and management by telemedicine and the availability of in person appointments. The patient expressed understanding and agreed to proceed.    Chief Complaint  Patient presents with   Medication Management    Patient says he is out of his Blood Pressure medication since before he can have his medication refilled. Patient says he is taking Benazepril for kidney protection for being Type 2 Diabetic. Patient says he has been without his medication since last Wednesday for his blood pressure. Patient says he takes Triamterene-HCTZ for his blood pressure.     History of Present Illness:    Hypertension: - Medications: Triamterene-hydrochlorothiazide 75-50 mg PO every day, Benazepril 5 mg PO every day  - Compliance: good compliance, he sometimes doesn't take Maxide on the weekends - definitely taking M-F  - Checking BP at home: checks at home sometimes  - usually 120s/70s  - Denies any SOB, CP, vision changes, LE edema, medication SEs, or symptoms of hypotension   He reports right shoulder pain that has been intermittent for some time (several years) He states he injured it from repetitive motion several years ago  He reports pain from time to time and would like his gabapentin refilled for this  He states he was previously using Gabapentin PRN for management  He has tried Tylenol and Ibuprofen in the past and this does not provide much relief     Observations/Objective:   Today's Vitals   10/01/22  1330  BP: 120/75   There is no height or weight on file to calculate BMI.  Due to the nature of the virtual visit, physical exam and observations are limited. Able to obtain the following observations:   Alert, oriented, x3 Appears comfortable, in no acute distress.  No scleral injection, no appreciated hoarseness, tachypnea, wheeze or strider. Able to maintain conversation without visible strain.  No cough appreciated during visit.    Assessment and Plan:   Problem List Items Addressed This Visit       Cardiovascular and Mediastinum   Hypertension associated with type 2 diabetes mellitus (HCC) - Primary    Chronic, historic condition Appears well controlled with current regimen of Triamterene-hydrochlorothiazide 75-50 mg PO every day, Benazepril 5 mg PO every day  Continue current regimen Refills of Maxide provided today Follow up in 6 months or sooner if concerns arise        Relevant Medications   triamterene-hydrochlorothiazide (MAXZIDE) 75-50 MG tablet   Other Visit Diagnoses     Right shoulder pain, unspecified chronicity     Patient reports shoulder pain is chronic and intermittent  He states he was previously using Gabapentin PRN for management as OTC medications have not been effective for management Reviewed that Gabapentin is not appropriate for PRN use for muscular/joint pain  Will send in script for meloxicam 7.5 mg PO every day PRN Follow up as needed for persistent or progressing symptoms     Relevant Medications   meloxicam (MOBIC) 7.5 MG tablet  Follow Up Instructions:    I discussed the assessment and treatment plan with the patient. The patient was provided an opportunity to ask questions and all were answered. The patient agreed with the plan and demonstrated an understanding of the instructions.   The patient was advised to call back or seek an in-person evaluation if the symptoms worsen or if the condition fails to improve as  anticipated.  I provided 10 minutes of non-face-to-face time during this encounter.  No follow-ups on file.   I, Camil Hausmann E Brevon Dewald, PA-C, have reviewed all documentation for this visit. The documentation on 10/01/22 for the exam, diagnosis, procedures, and orders are all accurate and complete.   Jacquelin Hawking, MHS, PA-C [facility]

## 2022-10-01 NOTE — Assessment & Plan Note (Signed)
Chronic, historic condition Appears well controlled with current regimen of Triamterene-hydrochlorothiazide 75-50 mg PO every day, Benazepril 5 mg PO every day  Continue current regimen Refills of Maxide provided today Follow up in 6 months or sooner if concerns arise

## 2022-10-26 ENCOUNTER — Other Ambulatory Visit: Payer: Self-pay | Admitting: Physician Assistant

## 2022-10-26 DIAGNOSIS — M25511 Pain in right shoulder: Secondary | ICD-10-CM

## 2022-10-26 NOTE — Telephone Encounter (Signed)
Requested Prescriptions  Pending Prescriptions Disp Refills   meloxicam (MOBIC) 7.5 MG tablet [Pharmacy Med Name: MELOXICAM 7.5MG  TABLETS] 90 tablet 1    Sig: TAKE 1 TABLET(7.5 MG) BY MOUTH DAILY     Analgesics:  COX2 Inhibitors Failed - 10/26/2022 10:10 AM      Failed - Manual Review: Labs are only required if the patient has taken medication for more than 8 weeks.      Failed - ALT in normal range and within 360 days    ALT  Date Value Ref Range Status  08/21/2022 48 (H) 0 - 44 IU/L Final         Passed - HGB in normal range and within 360 days    Hemoglobin  Date Value Ref Range Status  02/02/2022 16.6 13.0 - 17.7 g/dL Final         Passed - Cr in normal range and within 360 days    Creat  Date Value Ref Range Status  08/09/2017 1.25 0.60 - 1.35 mg/dL Final   Creatinine, Ser  Date Value Ref Range Status  08/21/2022 1.13 0.76 - 1.27 mg/dL Final         Passed - HCT in normal range and within 360 days    Hematocrit  Date Value Ref Range Status  02/02/2022 51.0 37.5 - 51.0 % Final         Passed - AST in normal range and within 360 days    AST  Date Value Ref Range Status  08/21/2022 19 0 - 40 IU/L Final         Passed - eGFR is 30 or above and within 360 days    GFR, Est African American  Date Value Ref Range Status  08/09/2017 85 > OR = 60 mL/min/1.50m2 Final   GFR calc Af Amer  Date Value Ref Range Status  10/28/2019 99 >59 mL/min/1.73 Final    Comment:    **[facility] currently reports eGFR in compliance with the current**   recommendations of the [employer]. [facility] will   update reporting as new guidelines are published from the NKF-ASN   Task force.    GFR, Est Non African American  Date Value Ref Range Status  08/09/2017 74 > OR = 60 mL/min/1.34m2 Final   GFR calc non Af Amer  Date Value Ref Range Status  10/28/2019 86 >59 mL/min/1.73 Final   eGFR  Date Value Ref Range Status  08/21/2022 84 >59 mL/min/1.73 Final         Passed  - Patient is not pregnant      Passed - Valid encounter within last 12 months    Recent Outpatient Visits           3 weeks ago Hypertension associated with type 2 diabetes mellitus (HCC)   [place] [facility] Mecum, Erin E, PA-C   2 months ago Type 2 diabetes mellitus with vascular disease (HCC)   [place] [facility] Larae Grooms, NP   5 months ago Type 2 diabetes mellitus with vascular disease [facility])   [place] [facility] Larae Grooms, NP   7 months ago Type 2 diabetes mellitus with vascular disease (HCC)   [place] [facility] Larae Grooms, NP   8 months ago Annual physical exam   [place] [facility] Larae Grooms, NP       Future Appointments             In 3  months Larae Grooms, NP [facility], [facility]

## 2022-11-15 ENCOUNTER — Telehealth: Payer: Self-pay

## 2022-11-15 NOTE — Telephone Encounter (Signed)
PA for Ozempic initiated and submitted via Cover My Meds. Key: [id number]

## 2022-11-16 NOTE — Telephone Encounter (Signed)
PA approved.

## 2022-12-14 ENCOUNTER — Other Ambulatory Visit: Payer: Self-pay | Admitting: Nurse Practitioner

## 2022-12-14 NOTE — Telephone Encounter (Signed)
Refused the Ozempic because it's being requested too soon.

## 2022-12-23 ENCOUNTER — Other Ambulatory Visit: Payer: Self-pay | Admitting: Nurse Practitioner

## 2022-12-25 NOTE — Telephone Encounter (Signed)
Requested Prescriptions  Pending Prescriptions Disp Refills   empagliflozin (JARDIANCE) 10 MG TABS tablet [Pharmacy Med Name: JARDIANCE 10MG  TABLETS] 90 tablet 0    Sig: TAKE 1 TABLET BY MOUTH EVERY DAY BEFORE BREAKFAST     Endocrinology:  Diabetes - SGLT2 Inhibitors Passed - 12/23/2022 10:04 AM      Passed - Cr in normal range and within 360 days    Creat  Date Value Ref Range Status  08/09/2017 1.25 0.60 - 1.35 mg/dL Final   Creatinine, Ser  Date Value Ref Range Status  08/21/2022 1.13 0.76 - 1.27 mg/dL Final         Passed - HBA1C is between 0 and 7.9 and within 180 days    HbA1c, POC (controlled diabetic range)  Date Value Ref Range Status  06/23/2021 5.8 0.0 - 7.0 % Final   Hgb A1c MFr Bld  Date Value Ref Range Status  08/21/2022 5.9 (H) 4.8 - 5.6 % Final    Comment:             Prediabetes: 5.7 - 6.4          Diabetes: >6.4          Glycemic control for adults with diabetes: <7.0          Passed - eGFR in normal range and within 360 days    GFR, Est African American  Date Value Ref Range Status  08/09/2017 85 > OR = 60 mL/min/1.69m2 Final   GFR calc Af Amer  Date Value Ref Range Status  10/28/2019 99 >59 mL/min/1.73 Final    Comment:    **[facility] currently reports eGFR in compliance with the current**   recommendations of the [employer]. [facility] will   update reporting as new guidelines are published from the NKF-ASN   Task force.    GFR, Est Non African American  Date Value Ref Range Status  08/09/2017 74 > OR = 60 mL/min/1.76m2 Final   GFR calc non Af Amer  Date Value Ref Range Status  10/28/2019 86 >59 mL/min/1.73 Final   eGFR  Date Value Ref Range Status  08/21/2022 84 >59 mL/min/1.73 Final         Passed - Valid encounter within last 6 months    Recent Outpatient Visits           2 months ago Hypertension associated with type 2 diabetes mellitus (HCC)   [place] [facility] Mecum, Erin E, PA-C   4 months ago  Type 2 diabetes mellitus with vascular disease (HCC)   [place] [facility] Larae Grooms, NP   7 months ago Type 2 diabetes mellitus with vascular disease [facility])   [place] [facility] Larae Grooms, NP   9 months ago Type 2 diabetes mellitus with vascular disease (HCC)   [place] [facility] Larae Grooms, NP   10 months ago Annual physical exam   [place] [facility] Larae Grooms, NP       Future Appointments             In 1 month Larae Grooms, NP [place] [facility], [facility]

## 2022-12-26 ENCOUNTER — Ambulatory Visit: Payer: Self-pay | Admitting: *Deleted

## 2022-12-26 NOTE — Telephone Encounter (Signed)
Summary: conngestion + losing voice   Patient called and states he is progressively getting worse. States he has congestion, losing voice, chest congestion pain. Patient states this started on Sunday.  Patient was scheduled for a virtual visit tomorrow with [place] Gum and he thought it was for today, patient states he needs relief today. I have left the appointment.  Please advise and call patient back @ # [phone]        no Reason for Disposition  [1] Patient also has allergy symptoms (e.g., itchy eyes, clear nasal discharge, postnasal drip) AND [2] they are acting up  Answer Assessment - Initial Assessment Questions 1. ONSET: "When did the cough begin?"      Sunday- dry cough 2. SEVERITY: "How bad is the cough today?"      Patient feels congestion is in chest- cough is not moving it up- using Musinex 3. SPUTUM: "Describe the color of your sputum" (none, dry cough; clear, white, yellow, green)     green 4. HEMOPTYSIS: "Are you coughing up any blood?" If so ask: "How much?" (flecks, streaks, tablespoons, etc.)     no 5. DIFFICULTY BREATHING: "Are you having difficulty breathing?" If Yes, ask: "How bad is it?" (e.g., mild, moderate, severe)    - MILD: No SOB at rest, mild SOB with walking, speaks normally in sentences, can lie down, no retractions, pulse < 100.    - MODERATE: SOB at rest, SOB with minimal exertion and prefers to sit, cannot lie down flat, speaks in phrases, mild retractions, audible wheezing, pulse 100-120.    - SEVERE: Very SOB at rest, speaks in single words, struggling to breathe, sitting hunched forward, retractions, pulse > 120      no 6. FEVER: "Do you have a fever?" If Yes, ask: "What is your temperature, how was it measured, and when did it start?"     no   10. OTHER SYMPTOMS: "Do you have any other symptoms?" (e.g., runny nose, wheezing, chest pain)       Nasal congestion  12. TRAVEL: "Have you traveled out of the country in the last month?"  (e.g., travel history, exposures)       Wife is Engineer, site, patient has been working outside  Protocols used: Cough - Acute Non-Productive-A-AH

## 2022-12-26 NOTE — Telephone Encounter (Signed)
  Chief Complaint: cough- wants to move appointment up Symptoms: cough, congestion Frequency: started Sunday Pertinent Negatives: Patient denies fever Disposition: [] ED /[] Urgent Care (no appt availability in office) / [x] Appointment(In office/virtual)/ []  [facility]/ [] Home Care/ [] Refused Recommended Disposition /[] [facility]/ []  Follow-up with PCP Additional Notes: call to office- no open appointment- offered UC/VV- patient declines due to cost- he will keep appointment tomorrow

## 2022-12-27 ENCOUNTER — Telehealth: Payer: BC Managed Care – PPO | Admitting: Family Medicine

## 2022-12-27 ENCOUNTER — Encounter: Payer: Self-pay | Admitting: Nurse Practitioner

## 2022-12-27 ENCOUNTER — Ambulatory Visit (INDEPENDENT_AMBULATORY_CARE_PROVIDER_SITE_OTHER): Payer: BC Managed Care – PPO | Admitting: Family Medicine

## 2022-12-27 VITALS — BP 113/76 | HR 94 | Temp 98.0°F | Ht 65.75 in | Wt 174.6 lb

## 2022-12-27 DIAGNOSIS — J069 Acute upper respiratory infection, unspecified: Secondary | ICD-10-CM | POA: Diagnosis not present

## 2022-12-27 LAB — VERITOR FLU A/B WAIVED
Influenza A: NEGATIVE
Influenza B: NEGATIVE

## 2022-12-27 MED ORDER — BENZONATATE 100 MG PO CAPS: 100.0000 mg | ORAL_CAPSULE | Freq: Two times a day (BID) | ORAL | 0 refills | Status: DC | PRN

## 2022-12-27 MED ORDER — ALBUTEROL SULFATE HFA 108 (90 BASE) MCG/ACT IN AERS: 2.0000 | INHALATION_SPRAY | Freq: Four times a day (QID) | RESPIRATORY_TRACT | 0 refills | Status: DC | PRN

## 2022-12-27 MED ORDER — AMOXICILLIN-POT CLAVULANATE 875-125 MG PO TABS: 1.0000 | ORAL_TABLET | Freq: Two times a day (BID) | ORAL | 0 refills | Status: AC

## 2022-12-27 MED ORDER — PREDNISONE 10 MG PO TABS: ORAL_TABLET | ORAL | 0 refills | Status: DC

## 2022-12-27 MED ORDER — BENAZEPRIL HCL 5 MG PO TABS: 5.0000 mg | ORAL_TABLET | Freq: Every day | ORAL | 1 refills | Status: DC

## 2022-12-27 NOTE — Progress Notes (Signed)
BP 113/76   Pulse 94   Temp 98 F (36.7 C) (Oral)   Ht 5' 5.75" (1.67 m)   Wt 174 lb 9.6 oz (79.2 kg)   SpO2 95%   BMI 28.40 kg/m    Subjective:    Patient ID: Devon Close., male    DOB: 08-Aug-1980, 42 y.o.   MRN: [id number]  HPI: Devon Macdonald. is a 42 y.o. male  Chief Complaint  Patient presents with   URI   UPPER RESPIRATORY TRACT INFECTION Symptoms started Sunday; 5 days ago. Hx of OSA, wears CPAP.  Worst symptom: Chest congestion and hoarseness Fever: no Cough: yes productive Shortness of breath: yes Wheezing: no Chest pain: no Chest tightness: yes Chest congestion: yes Nasal congestion: yes Runny nose: no Post nasal drip: no Sneezing: yes Sore throat:No Swollen glands: no Sinus pressure: yes Headache: Yes Face pain: no Toothache: no Ear pain: no  Ear pressure: no  Eyes red/itching:no Eye drainage/crusting: no  Vomiting: no Rash: no Fatigue: no Sick contacts: no Strep contacts: no  Context: worse Recurrent sinusitis: no Relief with OTC cold/cough medications: no  Treatments attempted:  Nyquil/Dayquil and mucinex    Relevant past medical, surgical, family and social history reviewed and updated as indicated. Interim medical history since our last visit reviewed. Allergies and medications reviewed and updated.  Review of Systems  Constitutional:  Negative for chills, fatigue and fever.  HENT:  Positive for congestion, sneezing and sore throat. Negative for ear pain, postnasal drip, rhinorrhea, sinus pressure and sinus pain.   Eyes:  Negative for discharge, redness and itching.  Respiratory:  Positive for cough, chest tightness and shortness of breath. Negative for wheezing.   Cardiovascular:  Negative for chest pain.  Gastrointestinal:  Negative for vomiting.  Skin:  Negative for rash.  Neurological:  Positive for headaches.    Per HPI unless specifically indicated above     Objective:    BP 113/76   Pulse 94   Temp 98  F (36.7 C) (Oral)   Ht 5' 5.75" (1.67 m)   Wt 174 lb 9.6 oz (79.2 kg)   SpO2 95%   BMI 28.40 kg/m   Wt Readings from Last 3 Encounters:  12/27/22 174 lb 9.6 oz (79.2 kg)  08/21/22 183 lb 6.4 oz (83.2 kg)  05/17/22 185 lb 1.6 oz (84 kg)    Physical Exam Vitals and nursing note reviewed.  Constitutional:      General: He is not in acute distress.    Appearance: Normal appearance. He is not ill-appearing, toxic-appearing or diaphoretic.  HENT:     Head: Normocephalic and atraumatic.     Right Ear: Tympanic membrane, ear canal and external ear normal. There is no impacted cerumen. Tympanic membrane is not erythematous.     Left Ear: Tympanic membrane, ear canal and external ear normal. There is no impacted cerumen. Tympanic membrane is not erythematous.     Nose: Congestion present. No rhinorrhea.     Right Turbinates: Not swollen or pale.     Left Turbinates: Not swollen or pale.     Right Sinus: No maxillary sinus tenderness or frontal sinus tenderness.     Left Sinus: No maxillary sinus tenderness or frontal sinus tenderness.     Mouth/Throat:     Mouth: Mucous membranes are moist.     Pharynx: Oropharynx is clear. No oropharyngeal exudate, posterior oropharyngeal erythema or postnasal drip.  Eyes:     General: No scleral icterus.  Right eye: No discharge.        Left eye: No discharge.     Extraocular Movements: Extraocular movements intact.     Conjunctiva/sclera: Conjunctivae normal.     Pupils: Pupils are equal, round, and reactive to light.  Neck:     Vascular: No carotid bruit.  Cardiovascular:     Rate and Rhythm: Normal rate and regular rhythm.     Pulses: Normal pulses.          Radial pulses are 2+ on the right side and 2+ on the left side.       Posterior tibial pulses are 2+ on the right side and 2+ on the left side.     Heart sounds: Normal heart sounds. No murmur heard.    No friction rub. No gallop.  Pulmonary:     Effort: Pulmonary effort is normal.  No respiratory distress.     Breath sounds: Normal breath sounds. No stridor. No wheezing, rhonchi or rales.  Chest:     Chest wall: No tenderness.  Musculoskeletal:        General: Normal range of motion.     Cervical back: Normal range of motion and neck supple. No rigidity. No muscular tenderness.  Lymphadenopathy:     Cervical: No cervical adenopathy.  Skin:    General: Skin is warm and dry.     Capillary Refill: Capillary refill takes less than 2 seconds.     Coloration: Skin is not jaundiced or pale.     Findings: No bruising, erythema, lesion or rash.  Neurological:     General: No focal deficit present.     Mental Status: He is alert and oriented to person, place, and time. Mental status is at baseline.     Cranial Nerves: No cranial nerve deficit.     Sensory: No sensory deficit.     Motor: No weakness.     Coordination: Coordination normal.     Gait: Gait normal.     Deep Tendon Reflexes: Reflexes normal.  Psychiatric:        Mood and Affect: Mood normal.        Behavior: Behavior normal.        Thought Content: Thought content normal.        Judgment: Judgment normal.     Results for orders placed or performed in visit on 10/02/22  HM DIABETES EYE EXAM  Result Value Ref Range   HM Diabetic Eye Exam No Retinopathy No Retinopathy      Assessment & Plan:   Problem List Items Addressed This Visit     Acute URI - Primary    Acute, ongoing. Flu -, awaiting COVID. Will treat with Augmentin BID for 7 days and 6 day Prednisone taper as symptoms presumed to be bacterial vs viral, and ongoing now for 5 days. Will send albuterol inhaler PRN for sob. Recommend warm liquids such as hot tea/soup, plenty of hydration, and rest. Return if symptoms fail to improve.       Relevant Orders   Novel Coronavirus, NAA ([facility])   Veritor Flu A/B Waived     Follow up plan: Return if symptoms worsen or fail to improve.

## 2022-12-27 NOTE — Assessment & Plan Note (Addendum)
Acute, ongoing. Flu -, awaiting COVID. Will treat with Augmentin BID for 7 days and 6 day Prednisone taper as symptoms presumed to be bacterial vs viral, and ongoing now for 5 days. Will send albuterol inhaler PRN for sob. Recommend warm liquids such as hot tea/soup, plenty of hydration, and rest. Return if symptoms fail to improve.

## 2022-12-28 ENCOUNTER — Encounter: Payer: Self-pay | Admitting: Nurse Practitioner

## 2022-12-28 MED ORDER — ROSUVASTATIN CALCIUM 5 MG PO TABS: 5.0000 mg | ORAL_TABLET | Freq: Every day | ORAL | 1 refills | Status: DC

## 2022-12-29 LAB — NOVEL CORONAVIRUS, NAA: SARS-CoV-2, NAA: NOT DETECTED

## 2023-01-18 ENCOUNTER — Telehealth: Payer: BC Managed Care – PPO | Admitting: Physician Assistant

## 2023-01-18 ENCOUNTER — Other Ambulatory Visit: Payer: Self-pay | Admitting: Nurse Practitioner

## 2023-01-18 DIAGNOSIS — S39012A Strain of muscle, fascia and tendon of lower back, initial encounter: Secondary | ICD-10-CM | POA: Diagnosis not present

## 2023-01-18 DIAGNOSIS — E1159 Type 2 diabetes mellitus with other circulatory complications: Secondary | ICD-10-CM | POA: Diagnosis not present

## 2023-01-18 MED ORDER — CYCLOBENZAPRINE HCL 10 MG PO TABS: 5.0000 mg | ORAL_TABLET | Freq: Three times a day (TID) | ORAL | 0 refills | Status: DC | PRN

## 2023-01-18 MED ORDER — PREDNISONE 10 MG (21) PO TBPK
ORAL_TABLET | ORAL | 0 refills | Status: DC
Start: 2023-01-18 — End: 2023-02-22

## 2023-01-18 MED ORDER — SEMAGLUTIDE (1 MG/DOSE) 4 MG/3ML ~~LOC~~ SOPN: 1.0000 mg | PEN_INJECTOR | SUBCUTANEOUS | 0 refills | Status: DC

## 2023-01-18 NOTE — Progress Notes (Signed)
Virtual Visit Consent   [name], you are scheduled for a virtual visit with a [facility] provider today. Just as with appointments in the office, your consent must be obtained to participate. Your consent will be active for this visit and any virtual visit you may have with one of our providers in the next 365 days. If you have a MyChart account, a copy of this consent can be sent to you electronically.  As this is a virtual visit, video technology does not allow for your provider to perform a traditional examination. This may limit your provider's ability to fully assess your condition. If your provider identifies any concerns that need to be evaluated in person or the need to arrange testing (such as labs, EKG, etc.), we will make arrangements to do so. Although advances in technology are sophisticated, we cannot ensure that it will always work on either your end or our end. If the connection with a video visit is poor, the visit may have to be switched to a telephone visit. With either a video or telephone visit, we are not always able to ensure that we have a secure connection.  By engaging in this virtual visit, you consent to the provision of healthcare and authorize for your insurance to be billed (if applicable) for the services provided during this visit. Depending on your insurance coverage, you may receive a charge related to this service.  I need to obtain your verbal consent now. Are you willing to proceed with your visit today? [name]. has provided verbal consent on 01/18/2023 for a virtual visit (video or telephone). Margaretann Loveless, PA-C  Date: 01/18/2023 4:48 PM  Virtual Visit via Video Note   I, Margaretann Loveless, connected with  Devon Macdonald.  ([id number], 1981-01-03) on 01/18/23 at  4:45 PM EST by a video-enabled telemedicine application and verified that I am speaking with the correct person using two identifiers.  Location: Patient:  Virtual Visit Location Patient: Mobile Provider: Virtual Visit Location Provider: Home Office   I discussed the limitations of evaluation and management by telemedicine and the availability of in person appointments. The patient expressed understanding and agreed to proceed.    History of Present Illness: Devon Macdonald. is a 42 y.o. who identifies as a male who was assigned male at birth, and is being seen today for low back pain, possible pulled muscle.  HPI: Back Pain This is a new problem. The current episode started yesterday (moving furniture around). The problem occurs constantly. The problem is unchanged. The pain is present in the lumbar spine. The quality of the pain is described as aching and cramping. The pain radiates to the left thigh (feels pulling into left posterior thigh). The pain is moderate. The pain is The same all the time. The symptoms are aggravated by position and bending. Stiffness is present All day. Pertinent negatives include no bladder incontinence, bowel incontinence, numbness, paresis, paresthesias, tingling or weakness. He has tried heat, NSAIDs and bed rest for the symptoms. The treatment provided no relief.     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   Elevated LFTs 11/10/2019   Flank pain 10/28/2019   OSA (obstructive sleep apnea) 10/20/2019   Cervical strain, acute 08/25/2019   Snoring 03/30/2019   Obesity (BMI 30.0-34.9) 11/03/2016  Cough 06/23/2014   Muscle spasms of neck 06/23/2014   Migraine headache 07/04/2011   Abnormal TSH 07/04/2011   Hyperlipidemia associated with type 2 diabetes mellitus (HCC) 06/13/2007   Hypertension associated with type 2 diabetes mellitus (HCC) 06/13/2007    Allergies:  Allergies  Allergen Reactions   Other Rash and Swelling   Bee Venom     swelling   Ciprofloxacin  Itching   Doxycycline Other (See Comments)    Reports groin swelling after taking pill.  Was hospitalized and received IV doxycycline without reaction.    Sulfa Antibiotics     Itching swelling of the hands and knees    Medications:  Current Outpatient Medications:    cyclobenzaprine (FLEXERIL) 10 MG tablet, Take 0.5-1 tablets (5-10 mg total) by mouth 3 (three) times daily as needed., Disp: 30 tablet, Rfl: 0   predniSONE (STERAPRED UNI-PAK 21 TAB) 10 MG (21) TBPK tablet, 6 day taper; take as directed on package instructions, Disp: 21 tablet, Rfl: 0   albuterol (VENTOLIN HFA) 108 (90 Base) MCG/ACT inhaler, Inhale 2 puffs into the lungs every 6 (six) hours as needed for wheezing or shortness of breath., Disp: 8 g, Rfl: 0   benazepril (LOTENSIN) 5 MG tablet, Take 1 tablet (5 mg total) by mouth daily., Disp: 90 tablet, Rfl: 1   benzonatate (TESSALON) 100 MG capsule, Take 1 capsule (100 mg total) by mouth 2 (two) times daily as needed for cough., Disp: 20 capsule, Rfl: 0   empagliflozin (JARDIANCE) 10 MG TABS tablet, TAKE 1 TABLET BY MOUTH EVERY DAY BEFORE BREAKFAST, Disp: 90 tablet, Rfl: 0   EPINEPHrine 0.3 mg/0.3 mL IJ SOAJ injection, Inject 0.3 mg into the muscle as needed for anaphylaxis., Disp: 1 each, Rfl: 3   fluticasone (FLONASE) 50 MCG/ACT nasal spray, SPRAY 2 SPRAYS INTO EACH NOSTRIL EVERY DAY, Disp: 48 mL, Rfl: 1   meloxicam (MOBIC) 7.5 MG tablet, TAKE 1 TABLET(7.5 MG) BY MOUTH DAILY, Disp: 90 tablet, Rfl: 1   metFORMIN (GLUCOPHAGE) 1000 MG tablet, TAKE 1 TABLET(1000 MG) BY MOUTH TWICE DAILY WITH A MEAL, Disp: 180 tablet, Rfl: 1   montelukast (SINGULAIR) 10 MG tablet, TAKE 1 TABLET BY MOUTH EVERYDAY AT BEDTIME, Disp: 90 tablet, Rfl: 1   Multiple Vitamin (MULTIVITAMIN WITH MINERALS) TABS, Take 1 tablet by mouth daily., Disp: , Rfl:    rosuvastatin (CRESTOR) 5 MG tablet, Take 1 tablet (5 mg total) by mouth daily., Disp: 90 tablet, Rfl: 1   Semaglutide, 1 MG/DOSE, 4 MG/3ML SOPN, Inject 1 mg  as directed once a week., Disp: 6 mL, Rfl: 0   triamterene-hydrochlorothiazide (MAXZIDE) 75-50 MG tablet, Take 1 tablet by mouth daily., Disp: 90 tablet, Rfl: 1  Observations/Objective: Patient is well-developed, well-nourished in no acute distress.  Resting comfortably  Head is normocephalic, atraumatic.  No labored breathing.  Speech is clear and coherent with logical content.  Patient is alert and oriented at baseline.    Assessment and Plan: 1. Strain of lumbar region, initial encounter - predniSONE (STERAPRED UNI-PAK 21 TAB) 10 MG (21) TBPK tablet; 6 day taper; take as directed on package instructions  Dispense: 21 tablet; Refill: 0 - cyclobenzaprine (FLEXERIL) 10 MG tablet; Take 0.5-1 tablets (5-10 mg total) by mouth 3 (three) times daily as needed.  Dispense: 30 tablet; Refill: 0  2. Type 2 diabetes mellitus with vascular disease (HCC) - Semaglutide, 1 MG/DOSE, 4 MG/3ML SOPN; Inject 1 mg as directed once a week.  Dispense: 6 mL; Refill: 0  - Suspect lumbar  strain from moving furniture - Failed NSAIDs - Add Prednisone dose pack and flexeril - Avoid NSAIDs (Ibuprofen/Advil/Motrin or Naproxen/Aleve) while on steroid - Tylenol if okay for breakthrough pain - Heat to area - Epsom salt soak if able to get in and out of bath tub safely - Back exercises and stretches provided via AVS  - Ozempic refilled on short term basis, advised needs to see PCP for future refills  - Seek in person evaluation if worsening or fails to improve with treatment   Follow Up Instructions: I discussed the assessment and treatment plan with the patient. The patient was provided an opportunity to ask questions and all were answered. The patient agreed with the plan and demonstrated an understanding of the instructions.  A copy of instructions were sent to the patient via MyChart unless otherwise noted below.    The patient was advised to call back or seek an in-person evaluation if the symptoms worsen  or if the condition fails to improve as anticipated.    Margaretann Loveless, PA-C

## 2023-01-18 NOTE — Patient Instructions (Signed)
Devon Macdonald., thank you for joining Margaretann Loveless, PA-C for today's virtual visit.  While this provider is not your primary care provider (PCP), if your PCP is located in our provider database this encounter information will be shared with them immediately following your visit.   A [facility] MyChart account gives you access to today's visit and all your visits, tests, and labs performed at [facility] " click here if you don't have a [facility] MyChart account or go to [email or web address]  Consent: (Patient) Devon Macdonald. provided verbal consent for this virtual visit at the beginning of the encounter.  Current Medications:  Current Outpatient Medications:    cyclobenzaprine (FLEXERIL) 10 MG tablet, Take 0.5-1 tablets (5-10 mg total) by mouth 3 (three) times daily as needed., Disp: 30 tablet, Rfl: 0   predniSONE (STERAPRED UNI-PAK 21 TAB) 10 MG (21) TBPK tablet, 6 day taper; take as directed on package instructions, Disp: 21 tablet, Rfl: 0   albuterol (VENTOLIN HFA) 108 (90 Base) MCG/ACT inhaler, Inhale 2 puffs into the lungs every 6 (six) hours as needed for wheezing or shortness of breath., Disp: 8 g, Rfl: 0   benazepril (LOTENSIN) 5 MG tablet, Take 1 tablet (5 mg total) by mouth daily., Disp: 90 tablet, Rfl: 1   benzonatate (TESSALON) 100 MG capsule, Take 1 capsule (100 mg total) by mouth 2 (two) times daily as needed for cough., Disp: 20 capsule, Rfl: 0   empagliflozin (JARDIANCE) 10 MG TABS tablet, TAKE 1 TABLET BY MOUTH EVERY DAY BEFORE BREAKFAST, Disp: 90 tablet, Rfl: 0   EPINEPHrine 0.3 mg/0.3 mL IJ SOAJ injection, Inject 0.3 mg into the muscle as needed for anaphylaxis., Disp: 1 each, Rfl: 3   fluticasone (FLONASE) 50 MCG/ACT nasal spray, SPRAY 2 SPRAYS INTO EACH NOSTRIL EVERY DAY, Disp: 48 mL, Rfl: 1   meloxicam (MOBIC) 7.5 MG tablet, TAKE 1 TABLET(7.5 MG) BY MOUTH DAILY, Disp: 90 tablet, Rfl: 1   metFORMIN (GLUCOPHAGE) 1000 MG tablet, TAKE 1  TABLET(1000 MG) BY MOUTH TWICE DAILY WITH A MEAL, Disp: 180 tablet, Rfl: 1   montelukast (SINGULAIR) 10 MG tablet, TAKE 1 TABLET BY MOUTH EVERYDAY AT BEDTIME, Disp: 90 tablet, Rfl: 1   Multiple Vitamin (MULTIVITAMIN WITH MINERALS) TABS, Take 1 tablet by mouth daily., Disp: , Rfl:    rosuvastatin (CRESTOR) 5 MG tablet, Take 1 tablet (5 mg total) by mouth daily., Disp: 90 tablet, Rfl: 1   Semaglutide, 1 MG/DOSE, 4 MG/3ML SOPN, Inject 1 mg as directed once a week., Disp: 6 mL, Rfl: 0   triamterene-hydrochlorothiazide (MAXZIDE) 75-50 MG tablet, Take 1 tablet by mouth daily., Disp: 90 tablet, Rfl: 1   Medications ordered in this encounter:  Meds ordered this encounter  Medications   predniSONE (STERAPRED UNI-PAK 21 TAB) 10 MG (21) TBPK tablet    Sig: 6 day taper; take as directed on package instructions    Dispense:  21 tablet    Refill:  0    Order Specific Question:   Supervising Provider    Answer:   Merrilee Jansky [[id number]]   cyclobenzaprine (FLEXERIL) 10 MG tablet    Sig: Take 0.5-1 tablets (5-10 mg total) by mouth 3 (three) times daily as needed.    Dispense:  30 tablet    Refill:  0    Order Specific Question:   Supervising Provider    Answer:   Merrilee Jansky [id number]   Semaglutide, 1 MG/DOSE, 4 MG/3ML SOPN    Sig:  Inject 1 mg as directed once a week.    Dispense:  6 mL    Refill:  0    Order Specific Question:   Supervising Provider    Answer:   Merrilee Jansky [[id number]]     *If you need refills on other medications prior to your next appointment, please contact your pharmacy*  Follow-Up: Call back or seek an in-person evaluation if the symptoms worsen or if the condition fails to improve as anticipated.  [facility] [phone]  Other Instructions Back Exercises The following exercises strengthen the muscles that help to support the trunk (torso) and back. They also help to keep the lower back flexible. Doing these exercises can help to prevent or  lessen existing low back pain. If you have back pain or discomfort, try doing these exercises 2-3 times each day or as told by your health care provider. As your pain improves, do them once each day, but increase the number of times that you repeat the steps for each exercise (do more repetitions). To prevent the recurrence of back pain, continue to do these exercises once each day or as told by your health care provider. Do exercises exactly as told by your health care provider and adjust them as directed. It is normal to feel mild stretching, pulling, tightness, or discomfort as you do these exercises, but you should stop right away if you feel sudden pain or your pain gets worse. Exercises Single knee to chest Repeat these steps 3-5 times for each leg: Lie on your back on a firm bed or the floor with your legs extended. Bring one knee to your chest. Your other leg should stay extended and in contact with the floor. Hold your knee in place by grabbing your knee or thigh with both hands and hold. Pull on your knee until you feel a gentle stretch in your lower back or buttocks. Hold the stretch for 10-30 seconds. Slowly release and straighten your leg.  Pelvic tilt Repeat these steps 5-10 times: Lie on your back on a firm bed or the floor with your legs extended. Bend your knees so they are pointing toward the ceiling and your feet are flat on the floor. Tighten your lower abdominal muscles to press your lower back against the floor. This motion will tilt your pelvis so your tailbone points up toward the ceiling instead of pointing to your feet or the floor. With gentle tension and even breathing, hold this position for 5-10 seconds.  Cat-cow Repeat these steps until your lower back becomes more flexible: Get into a hands-and-knees position on a firm bed or the floor. Keep your hands under your shoulders, and keep your knees under your hips. You may place padding under your knees for  comfort. Let your head hang down toward your chest. Contract your abdominal muscles and point your tailbone toward the floor so your lower back becomes rounded like the back of a cat. Hold this position for 5 seconds. Slowly lift your head, let your abdominal muscles relax, and point your tailbone up toward the ceiling so your back forms a sagging arch like the back of a cow. Hold this position for 5 seconds.  Press-ups Repeat these steps 5-10 times: Lie on your abdomen (face-down) on a firm bed or the floor. Place your palms near your head, about shoulder-width apart. Keeping your back as relaxed as possible and keeping your hips on the floor, slowly straighten your arms to raise the  top half of your body and lift your shoulders. Do not use your back muscles to raise your upper torso. You may adjust the placement of your hands to make yourself more comfortable. Hold this position for 5 seconds while you keep your back relaxed. Slowly return to lying flat on the floor.  Bridges Repeat these steps 10 times: Lie on your back on a firm bed or the floor. Bend your knees so they are pointing toward the ceiling and your feet are flat on the floor. Your arms should be flat at your sides, next to your body. Tighten your buttocks muscles and lift your buttocks off the floor until your waist is at almost the same height as your knees. You should feel the muscles working in your buttocks and the back of your thighs. If you do not feel these muscles, slide your feet 1-2 inches (2.5-5 cm) farther away from your buttocks. Hold this position for 3-5 seconds. Slowly lower your hips to the starting position, and allow your buttocks muscles to relax completely. If this exercise is too easy, try doing it with your arms crossed over your chest. Abdominal crunches Repeat these steps 5-10 times: Lie on your back on a firm bed or the floor with your legs extended. Bend your knees so they are pointing toward the  ceiling and your feet are flat on the floor. Cross your arms over your chest. Tip your chin slightly toward your chest without bending your neck. Tighten your abdominal muscles and slowly raise your torso high enough to lift your shoulder blades a tiny bit off the floor. Avoid raising your torso higher than that because it can put too much stress on your lower back and does not help to strengthen your abdominal muscles. Slowly return to your starting position.  Back lifts Repeat these steps 5-10 times: Lie on your abdomen (face-down) with your arms at your sides, and rest your forehead on the floor. Tighten the muscles in your legs and your buttocks. Slowly lift your chest off the floor while you keep your hips pressed to the floor. Keep the back of your head in line with the curve in your back. Your eyes should be looking at the floor. Hold this position for 3-5 seconds. Slowly return to your starting position.  Contact a health care provider if: Your back pain or discomfort gets much worse when you do an exercise. Your worsening back pain or discomfort does not lessen within 2 hours after you exercise. If you have any of these problems, stop doing these exercises right away. Do not do them again unless your health care provider says that you can. Get help right away if: You develop sudden, severe back pain. If this happens, stop doing the exercises right away. Do not do them again unless your health care provider says that you can. This information is not intended to replace advice given to you by your health care provider. Make sure you discuss any questions you have with your health care provider. Document Revised: 03/11/2022 Document Reviewed: 04/20/2020 Elsevier Patient Education  2024 [employer]vier [employer].    If you have been instructed to have an in-person evaluation today at a local Urgent Care facility, please use the link below. It will take you to a list of all of our available [facility], including address, phone number and hours of operation. Please do not delay care.  [facility]  If you or a family member do  not have a primary care provider, use the link below to schedule a visit and establish care. When you choose a [facility] primary care physician or advanced practice provider, you gain a long-term partner in health. Find a Primary Care Provider  Learn more about [facility]'s in-office and virtual care options: [facility] Get Care Now

## 2023-02-21 NOTE — Progress Notes (Signed)
 BP 112/76 (BP Location: Right Arm, Patient Position: Sitting, Cuff Size: Large)   Pulse 95   Temp 97.7 F (36.5 C) (Oral)   Ht 5' 6 (1.676 m)   Wt 173 lb 12.8 oz (78.8 kg)   SpO2 98%   BMI 28.05 kg/m    Subjective:    Patient ID: Devon Macdonald., male    DOB: [birth year], 43 y.o.   MRN: [id number]  HPI: Devon Macdonald. is a 43 y.o. male presenting on 02/22/2023 for comprehensive medical examination. Current medical complaints include: weight loss   He currently lives with: Interim Problems from his last visit: no  HYPERTENSION / HYPERLIPIDEMIA Satisfied with current treatment? yes Duration of hypertension: years BP monitoring frequency: not checking BP range:  BP medication side effects: no Past BP meds:  Triameterne, HCTZ, and Benzapril Duration of hyperlipidemia: years Cholesterol medication side effects: yes Cholesterol supplements: none Past cholesterol medications: crestor  Medication compliance: excellent compliance Aspirin: no Recent stressors: no Recurrent headaches: no Visual changes: no Palpitations: no Dyspnea: no Chest pain: no Lower extremity edema: no Dizzy/lightheaded: no   DIABETES Hypoglycemic episodes:no Polydipsia/polyuria: no Visual disturbance: no Chest pain: no Paresthesias: no Glucose Monitoring: no  Accucheck frequency: Not Checking  Fasting glucose:  Post prandial:  Evening:  Before meals: Taking Insulin?: no  Long acting insulin:  Short acting insulin: Blood Pressure Monitoring: not checking Retinal Examination: Up to Date Foot Exam: Up to Date Diabetic Education: Not Completed Pneumovax: Up to Date Influenza: Up to Date Aspirin: no  Patient states it feels like the inside of his shoulder is on fire.  He states it pops all the time and hurts.  The burning sensation started a few days ago.  Pain comes and goes.  He used heat last night which did help the pain.  It is on the posterior part of shoulder.       Depression Screen done today and results listed below:     02/22/2023    8:24 AM 12/27/2022    9:38 AM 08/21/2022    8:24 AM 05/17/2022    8:33 AM 03/12/2022    8:36 AM  Depression screen PHQ 2/9  Decreased Interest 0 0 0 0 0  Down, Depressed, Hopeless 0 0 0 0 0  PHQ - 2 Score 0 0 0 0 0  Altered sleeping 0 0 0 0 0  Tired, decreased energy 0 0 0 0 0  Change in appetite 0 0 0 0 0  Feeling bad or failure about yourself  0 0 0 0 0  Trouble concentrating 0 0 0 0 0  Moving slowly or fidgety/restless 0 0 0 0 0  Suicidal thoughts 0 0 0 0 0  PHQ-9 Score 0 0 0 0 0  Difficult doing work/chores  Not difficult at all Not difficult at all Not difficult at all Not difficult at all    The patient does not have a history of falls. I did complete a risk assessment for falls. A plan of care for falls was documented.   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   benazepril  (LOTENSIN ) 5 MG tablet Take 1 tablet (5 mg total) by mouth daily.   benzonatate  (TESSALON ) 100 MG capsule Take 1 capsule (100 mg total) by mouth 2 (two) times daily as needed for cough.   cyclobenzaprine  (FLEXERIL ) 10 MG tablet Take 0.5-1 tablets (5-10 mg total) by mouth 3 (three) times daily as needed.   empagliflozin  (JARDIANCE ) 10 MG TABS tablet TAKE 1 TABLET BY MOUTH EVERY DAY BEFORE BREAKFAST   EPINEPHrine  0.3 mg/0.3 mL IJ SOAJ injection Inject 0.3 mg into the muscle as needed for  anaphylaxis.   fluticasone  (FLONASE ) 50 MCG/ACT nasal spray SPRAY 2 SPRAYS INTO EACH NOSTRIL EVERY DAY   meloxicam  (MOBIC ) 7.5 MG tablet TAKE 1 TABLET(7.5 MG) BY MOUTH DAILY   Multiple Vitamin (MULTIVITAMIN WITH MINERALS) TABS Take 1 tablet by mouth daily.   Semaglutide , 1 MG/DOSE, 4 MG/3ML SOPN Inject 1 mg as directed once a week.   triamterene -hydrochlorothiazide  (MAXZIDE ) 75-50 MG tablet Take 1 tablet by mouth daily.   No current facility-administered medications on file prior to visit.    Allergies:  Allergies  Allergen Reactions   Other Rash and Swelling   Bee Venom     swelling   Ciprofloxacin Itching   Doxycycline  Other (See Comments)    Reports groin swelling after taking pill.  Was hospitalized and received IV doxycycline  without reaction.    Sulfa  Antibiotics     Itching swelling of the hands and knees     Social History:  Social History   Socioeconomic History   Marital status: Married    Spouse name: Not on file   Number of children: Not on file   Years of education: Not on file   Highest education level: Not on file  Occupational History   Not on file  Tobacco Use   Smoking status: Never   Smokeless tobacco: Never  Vaping Use   Vaping status: Never Used  Substance and Sexual Activity   Alcohol use: No   Drug use: No   Sexual activity: Yes  Other Topics Concern   Not on file  Social History Narrative   Not on file   Social Drivers of Health   Financial Resource Strain: Not on file  Food Insecurity: Not on file  Transportation Needs: Not on file  Physical Activity: Not on file  Stress: Not on file  Social Connections: Not on file  Intimate Partner Violence: Not on file   Social History   Tobacco Use  Smoking Status Never  Smokeless Tobacco Never   Social History   Substance and Sexual Activity  Alcohol Use No    Family History:  Family History  Problem Relation Age of Onset   Diabetes Mother    Diabetes Father     Past medical  history, surgical history, medications, allergies, family history and social history reviewed with patient today and changes made to appropriate areas of the chart.   Review of Systems  HENT:         Denies vision changes.  Eyes:  Negative for blurred vision and double vision.  Respiratory:  Negative for shortness of breath.   Cardiovascular:  Negative for chest pain, palpitations and leg swelling.  Musculoskeletal:        Left shoulder pain  Neurological:  Negative for dizziness, tingling and headaches.  Endo/Heme/Allergies:  Negative for polydipsia.       Denies Polyuria   All other ROS negative except what is listed above and in the HPI.      Objective:    BP 112/76 (BP Location: Right Arm, Patient Position: Sitting, Cuff Size: Large)   Pulse 95   Temp 97.7 F (36.5 C) (Oral)   Ht 5' 6 (1.676 m)   Wt 173 lb 12.8 oz (78.8 kg)   SpO2 98%   BMI 28.05 kg/m   Wt Readings from Last 3 Encounters:  02/22/23 173 lb 12.8 oz (78.8 kg)  12/27/22 174 lb 9.6 oz (79.2 kg)  08/21/22 183 lb 6.4 oz (83.2 kg)    Physical Exam Vitals and nursing note reviewed.  Constitutional:      General: He is not in acute distress.    Appearance: Normal appearance. He is obese. He is not ill-appearing, toxic-appearing or diaphoretic.  HENT:     Head: Normocephalic.     Right Ear: Tympanic membrane, ear canal and external ear normal.     Left Ear: Tympanic membrane, ear canal and external ear normal.     Nose: Nose normal. No congestion or rhinorrhea.     Mouth/Throat:     Mouth: Mucous membranes are moist.  Eyes:     General:        Right eye: No discharge.        Left eye: No discharge.     Extraocular Movements: Extraocular movements intact.     Conjunctiva/sclera: Conjunctivae normal.     Pupils: Pupils are equal, round, and reactive to light.  Cardiovascular:     Rate and Rhythm: Normal rate and regular rhythm.     Heart sounds: No murmur heard. Pulmonary:     Effort: Pulmonary effort  is normal. No respiratory distress.     Breath sounds: Normal breath sounds. No wheezing, rhonchi or rales.  Abdominal:     General: Abdomen is flat. Bowel sounds are normal. There is no distension.     Palpations: Abdomen is soft.     Tenderness: There is no abdominal tenderness. There is no guarding.  Musculoskeletal:       Arms:     Cervical back: Normal range of motion and neck supple.  Skin:    General: Skin is warm and dry.     Capillary Refill: Capillary refill takes less than 2 seconds.  Neurological:     General: No focal deficit present.     Mental Status: He is alert and oriented to person, place, and time.     Cranial Nerves: No cranial nerve deficit.     Motor: No weakness.     Deep Tendon Reflexes: Reflexes normal.  Psychiatric:        Mood and Affect: Mood normal.        Behavior: Behavior normal.        Thought Content: Thought content normal.        Judgment: Judgment normal.     Results for orders placed or performed in visit on 12/27/22  Veritor Flu A/B Waived   Collection Time: 12/27/22 10:01 AM  Result Value Ref Range   Influenza A Negative Negative   Influenza B Negative Negative  Novel Coronavirus, NAA ([facility])   Collection Time: 12/27/22 10:05 AM   Specimen: Nasopharyngeal(NP) swabs in vial transport medium  Result Value Ref Range   SARS-CoV-2, NAA Not Detected Not Detected  Assessment & Plan:   Problem List Items Addressed This Visit       Cardiovascular and Mediastinum   Hypertension associated with type 2 diabetes mellitus (HCC)   Chronic.  Controlled.  Continue with current medication regimen of Maxide and Benzapril (on for kidney protection).  Labs ordered today.  Return to clinic in 6 months for reevaluation.  Call sooner if concerns arise.       Relevant Medications   rosuvastatin  (CRESTOR ) 10 MG tablet   metFORMIN  (GLUCOPHAGE ) 1000 MG tablet   Type 2 diabetes mellitus with vascular disease (HCC)   Chronic. Patient doing well  with Ozempic .  Has lost 2lbs since last visit.  Continue with Ozempic , Metformin  and Jardiance .  Doing well and tolerating medication well.   Continue with low carb diet and exercise.  Follow up in 6 months.  Call sooner if concerns arise.       Relevant Medications   rosuvastatin  (CRESTOR ) 10 MG tablet   metFORMIN  (GLUCOPHAGE ) 1000 MG tablet   Other Relevant Orders   HgB A1c   Microalbumin, Urine Waived     Endocrine   Hyperlipidemia associated with type 2 diabetes mellitus (HCC)   Chronic.  Controlled.  Rosuvastatin  increased to 10mg .   Refills sent today.  Labs ordered today.  Return to clinic in 6 months for reevaluation.  Call sooner if concerns arise.       Relevant Medications   rosuvastatin  (CRESTOR ) 10 MG tablet   metFORMIN  (GLUCOPHAGE ) 1000 MG tablet   Other Relevant Orders   Lipid panel     Musculoskeletal and Integument   Muscle spasms of neck   Continue with heat pad and flexeril  prn for pain.  Follow up if not improved.          Other   RESOLVED: Obesity (BMI 30.0-34.9)   Other Visit Diagnoses       Annual physical exam    -  Primary   Health maintenance reviewed during visit today.  Labs ordered. Vaccines reviewed.   Relevant Orders   TSH   Lipid panel   CBC with Differential/Platelet   Comprehensive metabolic panel   Urinalysis, Routine w reflex microscopic   HgB A1c   Microalbumin, Urine Waived     Acute pain of left shoulder            Discussed aspirin prophylaxis for myocardial infarction prevention and decision was it was not indicated  LABORATORY TESTING:  Health maintenance labs ordered today as discussed above.    IMMUNIZATIONS:   - Tdap: Tetanus vaccination status reviewed: last tetanus booster within 10 years. - Influenza: Up to date - Pneumovax: Up to date - Prevnar: Not applicable - COVID: Not applicable - HPV: Not applicable - Shingrix vaccine: Not applicable  SCREENING: - Colonoscopy: Not applicable  Discussed with patient  purpose of the colonoscopy is to detect colon cancer at curable precancerous or early stages   - AAA Screening: Not applicable  -Hearing Test: Not applicable  -Spirometry: Not applicable   PATIENT COUNSELING:    Sexuality: Discussed sexually transmitted diseases, partner selection, use of condoms, avoidance of unintended pregnancy  and contraceptive alternatives.   Advised to avoid cigarette smoking.  I discussed with the patient that most people either abstain from alcohol or drink within safe limits (<=14/week and <=4 drinks/occasion for males, <=7/weeks and <= 3 drinks/occasion for females) and that the risk for alcohol disorders and other health effects rises proportionally with the number of drinks per week and  how often a drinker exceeds daily limits.  Discussed cessation/primary prevention of drug use and availability of treatment for abuse.   Diet: Encouraged to adjust caloric intake to maintain  or achieve ideal body weight, to reduce intake of dietary saturated fat and total fat, to limit sodium intake by avoiding high sodium foods and not adding table salt, and to maintain adequate dietary potassium and calcium  preferably from fresh fruits, vegetables, and low-fat dairy products.    stressed the importance of regular exercise  Injury prevention: Discussed safety belts, safety helmets, smoke detector, smoking near bedding or upholstery.   Dental health: Discussed importance of regular tooth brushing, flossing, and dental visits.   Follow up plan: NEXT PREVENTATIVE PHYSICAL DUE IN 1 YEAR. Return in about 6 months (around 08/22/2023) for HTN, HLD, DM2 FU.

## 2023-02-22 ENCOUNTER — Ambulatory Visit: Payer: 59 | Admitting: Nurse Practitioner

## 2023-02-22 ENCOUNTER — Encounter: Payer: Self-pay | Admitting: Nurse Practitioner

## 2023-02-22 VITALS — BP 112/76 | HR 95 | Temp 97.7°F | Ht 66.0 in | Wt 173.8 lb

## 2023-02-22 DIAGNOSIS — Z0000 Encounter for general adult medical examination without abnormal findings: Secondary | ICD-10-CM

## 2023-02-22 DIAGNOSIS — M25512 Pain in left shoulder: Secondary | ICD-10-CM

## 2023-02-22 DIAGNOSIS — M62838 Other muscle spasm: Secondary | ICD-10-CM

## 2023-02-22 DIAGNOSIS — E1169 Type 2 diabetes mellitus with other specified complication: Secondary | ICD-10-CM | POA: Diagnosis not present

## 2023-02-22 DIAGNOSIS — I152 Hypertension secondary to endocrine disorders: Secondary | ICD-10-CM

## 2023-02-22 DIAGNOSIS — E1159 Type 2 diabetes mellitus with other circulatory complications: Secondary | ICD-10-CM | POA: Diagnosis not present

## 2023-02-22 DIAGNOSIS — Z7985 Long-term (current) use of injectable non-insulin antidiabetic drugs: Secondary | ICD-10-CM

## 2023-02-22 DIAGNOSIS — E785 Hyperlipidemia, unspecified: Secondary | ICD-10-CM

## 2023-02-22 DIAGNOSIS — E66811 Obesity, class 1: Secondary | ICD-10-CM | POA: Diagnosis not present

## 2023-02-22 LAB — MICROSCOPIC EXAMINATION
Bacteria, UA: NONE SEEN
WBC, UA: NONE SEEN /[HPF] (ref 0–5)

## 2023-02-22 LAB — URINALYSIS, ROUTINE W REFLEX MICROSCOPIC
Bilirubin, UA: NEGATIVE
Leukocytes,UA: NEGATIVE
Nitrite, UA: NEGATIVE
Specific Gravity, UA: 1.025 (ref 1.005–1.030)
Urobilinogen, Ur: 1 mg/dL (ref 0.2–1.0)
pH, UA: 5.5 (ref 5.0–7.5)

## 2023-02-22 LAB — MICROALBUMIN, URINE WAIVED
Creatinine, Urine Waived: 300 mg/dL (ref 10–300)
Microalb, Ur Waived: 80 mg/L — ABNORMAL HIGH (ref 0–19)

## 2023-02-22 MED ORDER — ROSUVASTATIN CALCIUM 10 MG PO TABS: 10.0000 mg | ORAL_TABLET | Freq: Every day | ORAL | 1 refills | Status: DC

## 2023-02-22 MED ORDER — MONTELUKAST SODIUM 10 MG PO TABS: ORAL_TABLET | ORAL | 1 refills | Status: DC

## 2023-02-22 MED ORDER — METFORMIN HCL 1000 MG PO TABS: ORAL_TABLET | ORAL | 1 refills | Status: DC

## 2023-02-22 NOTE — Assessment & Plan Note (Signed)
 Continue with heat pad and flexeril prn for pain.  Follow up if not improved.

## 2023-02-22 NOTE — Assessment & Plan Note (Signed)
Chronic. Patient doing well with Ozempic.  Has lost 2lbs since last visit.  Continue with Ozempic, Metformin and Jardiance.  Doing well and tolerating medication well.   Continue with low carb diet and exercise.  Follow up in 6 months.  Call sooner if concerns arise.

## 2023-02-22 NOTE — Assessment & Plan Note (Signed)
 Chronic.  Controlled.  Continue with current medication regimen of Maxide and Benzapril (on for kidney protection).  Labs ordered today.  Return to clinic in 6 months for reevaluation.  Call sooner if concerns arise.

## 2023-02-22 NOTE — Assessment & Plan Note (Signed)
 Chronic.  Controlled.  Rosuvastatin increased to 10mg .   Refills sent today.  Labs ordered today.  Return to clinic in 6 months for reevaluation.  Call sooner if concerns arise.

## 2023-02-23 LAB — CBC WITH DIFFERENTIAL/PLATELET
Basophils Absolute: 0.1 10*3/uL (ref 0.0–0.2)
Basos: 1 %
EOS (ABSOLUTE): 0.1 10*3/uL (ref 0.0–0.4)
Eos: 1 %
Hematocrit: 53.7 % — ABNORMAL HIGH (ref 37.5–51.0)
Hemoglobin: 17.7 g/dL (ref 13.0–17.7)
Immature Grans (Abs): 0 10*3/uL (ref 0.0–0.1)
Immature Granulocytes: 0 %
Lymphocytes Absolute: 3 10*3/uL (ref 0.7–3.1)
Lymphs: 41 %
MCH: 28.6 pg (ref 26.6–33.0)
MCHC: 33 g/dL (ref 31.5–35.7)
MCV: 87 fL (ref 79–97)
Monocytes Absolute: 0.6 10*3/uL (ref 0.1–0.9)
Monocytes: 9 %
Neutrophils Absolute: 3.6 10*3/uL (ref 1.4–7.0)
Neutrophils: 48 %
Platelets: 351 10*3/uL (ref 150–450)
RBC: 6.19 x10E6/uL — ABNORMAL HIGH (ref 4.14–5.80)
RDW: 13.5 % (ref 11.6–15.4)
WBC: 7.4 10*3/uL (ref 3.4–10.8)

## 2023-02-23 LAB — LIPID PANEL
Chol/HDL Ratio: 3.1 {ratio} (ref 0.0–5.0)
Cholesterol, Total: 160 mg/dL (ref 100–199)
HDL: 52 mg/dL (ref >39)
LDL Chol Calc (NIH): 80 mg/dL (ref 0–99)
Triglycerides: 162 mg/dL — ABNORMAL HIGH (ref 0–149)
VLDL Cholesterol Cal: 28 mg/dL (ref 5–40)

## 2023-02-23 LAB — COMPREHENSIVE METABOLIC PANEL
ALT: 28 [IU]/L (ref 0–44)
AST: 16 [IU]/L (ref 0–40)
Albumin: 4.5 g/dL (ref 4.1–5.1)
Alkaline Phosphatase: 84 [IU]/L (ref 44–121)
BUN/Creatinine Ratio: 9 (ref 9–20)
BUN: 10 mg/dL (ref 6–24)
Bilirubin Total: 0.5 mg/dL (ref 0.0–1.2)
CO2: 25 mmol/L (ref 20–29)
Calcium: 10.2 mg/dL (ref 8.7–10.2)
Chloride: 95 mmol/L — ABNORMAL LOW (ref 96–106)
Creatinine, Ser: 1.16 mg/dL (ref 0.76–1.27)
Globulin, Total: 2.4 g/dL (ref 1.5–4.5)
Glucose: 100 mg/dL — ABNORMAL HIGH (ref 70–99)
Potassium: 3.8 mmol/L (ref 3.5–5.2)
Sodium: 138 mmol/L (ref 134–144)
Total Protein: 6.9 g/dL (ref 6.0–8.5)
eGFR: 81 mL/min/{1.73_m2} (ref >59)

## 2023-02-23 LAB — HEMOGLOBIN A1C
Est. average glucose Bld gHb Est-mCnc: 120 mg/dL
Hgb A1c MFr Bld: 5.8 % — ABNORMAL HIGH (ref 4.8–5.6)

## 2023-02-23 LAB — TSH: TSH: 4.3 u[IU]/mL (ref 0.450–4.500)

## 2023-03-04 ENCOUNTER — Encounter: Payer: Self-pay | Admitting: Nurse Practitioner

## 2023-03-04 DIAGNOSIS — G8929 Other chronic pain: Secondary | ICD-10-CM

## 2023-03-06 ENCOUNTER — Encounter: Payer: Self-pay | Admitting: Nurse Practitioner

## 2023-03-06 NOTE — Telephone Encounter (Signed)
 This is a new patient. I have not met her.

## 2023-03-15 ENCOUNTER — Other Ambulatory Visit: Payer: Self-pay | Admitting: Physician Assistant

## 2023-03-15 DIAGNOSIS — S39012A Strain of muscle, fascia and tendon of lower back, initial encounter: Secondary | ICD-10-CM

## 2023-03-15 DIAGNOSIS — E1159 Type 2 diabetes mellitus with other circulatory complications: Secondary | ICD-10-CM

## 2023-03-18 MED ORDER — CYCLOBENZAPRINE HCL 10 MG PO TABS: 5.0000 mg | ORAL_TABLET | Freq: Three times a day (TID) | ORAL | 0 refills | Status: DC | PRN

## 2023-03-18 MED ORDER — SEMAGLUTIDE (1 MG/DOSE) 4 MG/3ML ~~LOC~~ SOPN: 1.0000 mg | PEN_INJECTOR | SUBCUTANEOUS | 1 refills | Status: DC

## 2023-03-24 ENCOUNTER — Other Ambulatory Visit: Payer: Self-pay | Admitting: Physician Assistant

## 2023-03-24 DIAGNOSIS — I152 Hypertension secondary to endocrine disorders: Secondary | ICD-10-CM

## 2023-03-26 NOTE — Telephone Encounter (Signed)
 Requested Prescriptions  Pending Prescriptions Disp Refills   triamterene -hydrochlorothiazide  (MAXZIDE ) 75-50 MG tablet [Pharmacy Med Name: TRIAMTERENE  75MG / HCTZ 50MG  TABLETS] 90 tablet 1    Sig: TAKE 1 TABLET BY MOUTH DAILY     Cardiovascular: Diuretic Combos Passed - 03/26/2023  8:21 AM      Passed - K in normal range and within 180 days    Potassium  Date Value Ref Range Status  02/22/2023 3.8 3.5 - 5.2 mmol/L Final         Passed - Na in normal range and within 180 days    Sodium  Date Value Ref Range Status  02/22/2023 138 134 - 144 mmol/L Final         Passed - Cr in normal range and within 180 days    Creat  Date Value Ref Range Status  08/09/2017 1.25 0.60 - 1.35 mg/dL Final   Creatinine, Ser  Date Value Ref Range Status  02/22/2023 1.16 0.76 - 1.27 mg/dL Final         Passed - Last BP in normal range    BP Readings from Last 1 Encounters:  02/22/23 112/76         Passed - Valid encounter within last 6 months    Recent Outpatient Visits           1 month ago Annual physical exam   [place] [facility] Melvin Pao, NP   2 months ago Acute URI   [place] [facility] [place], Hyla Givens, NP   5 months ago Hypertension associated with type 2 diabetes mellitus (HCC)   [place] [facility] M[facility]um, Erin E, PA-C   7 months ago Type 2 diabetes mellitus with vascular disease [facility])   [place] [facility] Melvin Pao, NP   10 months ago Type 2 diabetes mellitus with vascular disease (HCC)   [place] [facility] Melvin Pao, NP       Future Appointments             In 5 months Melvin Pao, NP [place] [facility], [facility]

## 2023-04-01 ENCOUNTER — Encounter: Payer: Self-pay | Admitting: Emergency Medicine

## 2023-04-01 ENCOUNTER — Emergency Department
Admission: EM | Admit: 2023-04-01 | Discharge: 2023-04-01 | Disposition: A | Discharge status: Home / Self Care | Payer: 59 | Attending: Emergency Medicine | Admitting: Emergency Medicine

## 2023-04-01 ENCOUNTER — Other Ambulatory Visit: Payer: Self-pay

## 2023-04-01 ENCOUNTER — Encounter: Payer: Self-pay | Admitting: Nurse Practitioner

## 2023-04-01 DIAGNOSIS — A084 Viral intestinal infection, unspecified: Secondary | ICD-10-CM | POA: Diagnosis not present

## 2023-04-01 DIAGNOSIS — E119 Type 2 diabetes mellitus without complications: Secondary | ICD-10-CM | POA: Insufficient documentation

## 2023-04-01 DIAGNOSIS — R112 Nausea with vomiting, unspecified: Secondary | ICD-10-CM | POA: Diagnosis present

## 2023-04-01 DIAGNOSIS — I10 Essential (primary) hypertension: Secondary | ICD-10-CM | POA: Diagnosis not present

## 2023-04-01 LAB — COMPREHENSIVE METABOLIC PANEL
ALT: 33 U/L (ref 0–44)
AST: 25 U/L (ref 15–41)
Albumin: 4.6 g/dL (ref 3.5–5.0)
Alkaline Phosphatase: 61 U/L (ref 38–126)
Anion gap: 16 — ABNORMAL HIGH (ref 5–15)
BUN: 19 mg/dL (ref 6–20)
CO2: 23 mmol/L (ref 22–32)
Calcium: 9.4 mg/dL (ref 8.9–10.3)
Chloride: 104 mmol/L (ref 98–111)
Creatinine, Ser: 1.17 mg/dL (ref 0.61–1.24)
GFR, Estimated: >60 mL/min (ref >60)
Glucose, Bld: 139 mg/dL — ABNORMAL HIGH (ref 70–99)
Potassium: 3.8 mmol/L (ref 3.5–5.1)
Sodium: 143 mmol/L (ref 135–145)
Total Bilirubin: 1.7 mg/dL — ABNORMAL HIGH (ref 0.0–1.2)
Total Protein: 7.5 g/dL (ref 6.5–8.1)

## 2023-04-01 LAB — URINALYSIS, ROUTINE W REFLEX MICROSCOPIC
Bilirubin Urine: NEGATIVE
Glucose, UA: >=500 mg/dL — AB
Ketones, ur: 80 mg/dL — AB
Leukocytes,Ua: NEGATIVE
Nitrite: NEGATIVE
Protein, ur: NEGATIVE mg/dL
Specific Gravity, Urine: 1.038 — ABNORMAL HIGH (ref 1.005–1.030)
pH: 5 (ref 5.0–8.0)

## 2023-04-01 LAB — CBG MONITORING, ED: Glucose-Capillary: 135 mg/dL — ABNORMAL HIGH (ref 70–99)

## 2023-04-01 LAB — CBC
HCT: 53.5 % — ABNORMAL HIGH (ref 39.0–52.0)
Hemoglobin: 17.9 g/dL — ABNORMAL HIGH (ref 13.0–17.0)
MCH: 28.7 pg (ref 26.0–34.0)
MCHC: 33.5 g/dL (ref 30.0–36.0)
MCV: 85.7 fL (ref 80.0–100.0)
Platelets: 324 10*3/uL (ref 150–400)
RBC: 6.24 MIL/uL — ABNORMAL HIGH (ref 4.22–5.81)
RDW: 14.2 % (ref 11.5–15.5)
WBC: 14.5 10*3/uL — ABNORMAL HIGH (ref 4.0–10.5)
nRBC: 0 % (ref 0.0–0.2)

## 2023-04-01 LAB — LIPASE, BLOOD: Lipase: 34 U/L (ref 11–51)

## 2023-04-01 MED ORDER — KETOROLAC TROMETHAMINE 30 MG/ML IJ SOLN
30.0000 mg | Freq: Once | INTRAMUSCULAR | Status: AC
  Administered 2023-04-01: 30 mg via INTRAMUSCULAR
  Filled 2023-04-01: qty 1

## 2023-04-01 MED ORDER — ONDANSETRON 4 MG PO TBDP: 4.0000 mg | ORAL_TABLET | Freq: Three times a day (TID) | ORAL | 0 refills | Status: DC | PRN

## 2023-04-01 MED ORDER — DICYCLOMINE HCL 10 MG PO CAPS: 10.0000 mg | ORAL_CAPSULE | Freq: Three times a day (TID) | ORAL | 0 refills | Status: DC

## 2023-04-01 MED ORDER — ONDANSETRON 4 MG PO TBDP
4.0000 mg | ORAL_TABLET | Freq: Once | ORAL | Status: AC | PRN
  Administered 2023-04-01: 4 mg via ORAL
  Filled 2023-04-01: qty 1

## 2023-04-01 NOTE — ED Provider Notes (Signed)
 [facility] Provider Note    Event Date/Time   First MD Initiated Contact with Patient 04/01/23 1018     (approximate)   History   Abdominal Pain   HPI  Devon Catrett. is a 43 y.o. male with a history of diabetes, hypertension who presents with complaints of nausea vomiting diarrhea abdominal cramping which started at approximately 2 AM.  Initially primary diarrhea, now nausea vomiting.     Physical Exam   Triage Vital Signs: ED Triage Vitals  Encounter Vitals Group     BP 04/01/23 1014 128/85     Systolic BP Percentile --      Diastolic BP Percentile --      Pulse Rate 04/01/23 1014 (!) 122     Resp 04/01/23 1014 18     Temp 04/01/23 1014 98.1 F (36.7 C)     Temp Source 04/01/23 1014 Oral     SpO2 04/01/23 1014 96 %     Weight 04/01/23 1012 79.4 kg (175 lb)     Height 04/01/23 1012 1.676 m (5\' 6" )     Head Circumference --      Peak Flow --      Pain Score 04/01/23 1010 10     Pain Loc --      Pain Education --      Exclude from Growth Chart --     Most recent vital signs: Vitals:   04/01/23 1014  BP: 128/85  Pulse: (!) 122  Resp: 18  Temp: 98.1 F (36.7 C)  SpO2: 96%     General: Awake, no distress.  CV:  Good peripheral perfusion.  Resp:  Normal effort.  Abd:  No distention.  Overall reassuring exam Other:     ED Results / Procedures / Treatments   Labs (all labs ordered are listed, but only abnormal results are displayed) Labs Reviewed  COMPREHENSIVE METABOLIC PANEL - Abnormal; Notable for the following components:      Result Value   Glucose, Bld 139 (*)    Total Bilirubin 1.7 (*)    Anion gap 16 (*)    All other components within normal limits  CBC - Abnormal; Notable for the following components:   WBC 14.5 (*)    RBC 6.24 (*)    Hemoglobin 17.9 (*)    HCT 53.5 (*)    All other components within normal limits  URINALYSIS, ROUTINE W REFLEX MICROSCOPIC - Abnormal; Notable for the following  components:   Color, Urine YELLOW (*)    APPearance CLEAR (*)    Specific Gravity, Urine 1.038 (*)    Glucose, UA >=500 (*)    Hgb urine dipstick MODERATE (*)    Ketones, ur 80 (*)    Bacteria, UA RARE (*)    All other components within normal limits  CBG MONITORING, ED - Abnormal; Notable for the following components:   Glucose-Capillary 135 (*)    All other components within normal limits  LIPASE, BLOOD     EKG     RADIOLOGY     PROCEDURES:  Critical Care performed:   Procedures   MEDICATIONS ORDERED IN ED: Medications  ondansetron  (ZOFRAN -ODT) disintegrating tablet 4 mg (4 mg Oral Given 04/01/23 1100)  ketorolac  (TORADOL ) 30 MG/ML injection 30 mg (30 mg Intramuscular Given 04/01/23 1058)     IMPRESSION / MDM / ASSESSMENT AND PLAN / ED COURSE  I reviewed the triage vital signs and the nursing notes. Patient's presentation is most consistent  with acute presentation with potential threat to life or bodily function.  Patient presents with nausea vomiting diarrhea abdominal pain as detailed above, he does have a history of diabetes, differential includes DKA, viral gastroenteritis  Overall well-appearing he does have tachycardia, pending lab work, CMP.  Abdominal exam overall reassuring.  Will treat with IM Toradol , ODT Zofran   After Zofran  he is tolerating water  and has drank several cups of water  ----------------------------------------- 12:51 PM on 04/01/2023 ----------------------------------------- Lab work was delayed but has finally returned anion gap of 16 which I suspect is related to dehydration given normal glucose, some ketones in urine possibly was related to dehydration discussed with him possibility of putting an IV and giving bag of normal saline but he adamantly declined and would like to go home, he feels much better and will return if any worsening     FINAL CLINICAL IMPRESSION(S) / ED DIAGNOSES   Final diagnoses:  Viral gastroenteritis      Rx / DC Orders   ED Discharge Orders          Ordered    ondansetron  (ZOFRAN -ODT) 4 MG disintegrating tablet  Every 8 hours PRN        04/01/23 1249             Note:  This document was prepared using Dragon voice recognition software and may include unintentional dictation errors.   Bryson Carbine, MD 04/01/23 [phone]

## 2023-04-01 NOTE — ED Triage Notes (Signed)
 Pt arrived via POV with reports of abdominal pain, N/V/D, pt states sxs began around 2am this morning, states he also had the chills.  No surgeries to abdomen area.  Pt also reports excessive thirst with hx of diabetes.

## 2023-04-01 NOTE — ED Notes (Signed)
 See triage notes. Patient c/o N/V/D since about 0230 this morning.

## 2023-05-13 ENCOUNTER — Telehealth: Payer: Self-pay | Admitting: Genetic Counselor

## 2023-05-13 ENCOUNTER — Encounter: Payer: Self-pay | Admitting: Genetic Counselor

## 2023-05-13 NOTE — Telephone Encounter (Signed)
 Scheduled genetic counseling appt for 4/8 (MyChart) to be seen with his mother given her BRCA2 positive result, as he stated he as interested in genetic testing.

## 2023-05-24 ENCOUNTER — Ambulatory Visit
Admission: RE | Admit: 2023-05-24 | Discharge: 2023-05-24 | Disposition: A | Discharge status: Home / Self Care | Source: Ambulatory Visit | Attending: Nurse Practitioner | Admitting: Nurse Practitioner

## 2023-05-24 ENCOUNTER — Ambulatory Visit
Admission: RE | Admit: 2023-05-24 | Discharge: 2023-05-24 | Disposition: A | Discharge status: Home / Self Care | Attending: Nurse Practitioner | Admitting: Nurse Practitioner

## 2023-05-24 DIAGNOSIS — G8929 Other chronic pain: Secondary | ICD-10-CM

## 2023-05-27 ENCOUNTER — Encounter: Payer: Self-pay | Admitting: Nurse Practitioner

## 2023-05-27 DIAGNOSIS — G8929 Other chronic pain: Secondary | ICD-10-CM

## 2023-05-28 ENCOUNTER — Telehealth

## 2023-06-04 ENCOUNTER — Inpatient Hospital Stay: Attending: Oncology | Admitting: Genetic Counselor

## 2023-06-04 ENCOUNTER — Other Ambulatory Visit: Payer: Self-pay | Admitting: Genetic Counselor

## 2023-06-04 DIAGNOSIS — Z8042 Family history of malignant neoplasm of prostate: Secondary | ICD-10-CM

## 2023-06-04 DIAGNOSIS — Z8481 Family history of carrier of genetic disease: Secondary | ICD-10-CM

## 2023-06-04 DIAGNOSIS — Z803 Family history of malignant neoplasm of breast: Secondary | ICD-10-CM

## 2023-06-05 ENCOUNTER — Encounter: Payer: Self-pay | Admitting: Nurse Practitioner

## 2023-06-05 ENCOUNTER — Encounter: Payer: Self-pay | Admitting: Genetic Counselor

## 2023-06-05 NOTE — Progress Notes (Signed)
 REFERRING PROVIDER: Self-referred  PRIMARY PROVIDER:  Aileen Alexanders, NP  PRIMARY REASON FOR VISIT:  1. Family history of gene mutation   2. Family history of breast cancer   3. Family history of prostate cancer     I connected with Mr. Chapple on 06/04/2023 at 8AM ET by MyChart Video and verified that I am speaking with the correct person using two identifiers.   Patient location: home Provider location: [facility] office   HISTORY OF PRESENT ILLNESS:   Mr. Willinger, a 43 y.o. male, was seen for a [place] cancer genetics consultation due his mother's recent diagnosis of breast cancer and genetic testing that showed a BRCA2 gene mutation Mr. Mazzola presents to clinic today to discuss the possibility of a hereditary predisposition to cancer, to discuss genetic testing, and to further clarify his future cancer risks, as well as potential cancer risks for family members.   Mr. Keng is a 43 y.o. male with no personal history of cancer.      CANCER HISTORY:  Oncology History   No history exists.     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  Significant diagnoses are listed below: Family History  Problem Relation Age of Onset   Diabetes Mother    Breast cancer Mother 81       BRCA2 positive; triple negative   Diabetes Father    Prostate cancer Paternal Uncle    Cancer Other        MGM's siblings (breast in MGMs's sister dx >50; prostate in MGM's brother; unknown cancer in MGM's sister)     Mr. Woodmansee mother had genetic testing for the [facility] CancerNext-Expanded +RNAinsight Panel, which showed a single pathogenic variant in BRCA2  at c.4211delC (p.S1404*).  The CancerNext-Expanded gene panel offered by [facility] and includes sequencing, rearrangement, and RNA analysis for the following 76 genes: AIP, ALK, APC, ATM, AXIN2, BAP1, BARD1, BMPR1A, BRCA1, BRCA2, BRIP1, CDC73, CDH1, CDK4, CDKN1B, CDKN2A, CEBPA, CHEK2, CTNNA1, DDX41, DICER1, ETV6, FH, FLCN, GATA2, LZTR1, MAX, MBD4, MEN1, MET, MLH1, MSH2, MSH3, MSH6, MUTYH, NF1, NF2, NTHL1, PALB2, PHOX2B, PMS2, POT1, PRKAR1A, PTCH1, PTEN, RAD51C, RAD51D, RB1, RET, RUNX1, SDHA, SDHAF2, SDHB, SDHC, SDHD, SMAD4, SMARCA4, SMARCB1, SMARCE1, STK11, SUFU, TMEM127, TP53, TSC1, TSC2, VHL, and WT1 (sequencing and deletion/duplication); EGFR, HOXB13, KIT, MITF, PDGFRA, POLD1, and POLE (sequencing only); EPCAM and GREM1 (deletion/duplication only).    There is no reported Ashkenazi Jewish ancestry. There is no known consanguinity.  GENETIC COUNSELING ASSESSMENT: Mr. Pope is a 43 y.o. male with a family history of a BRCA2 gene mutation in his maternal family. We, therefore, discussed and recommended the following at today's visit.   DISCUSSION: We discussed that 5 - 10% of cancer is hereditary, with most cases of hereditary breast cancer associated with mutations in BRCA1/2.  Given his mother has a mutation in the BRCA2 gene, he has a 50% chance of having the same family mutation in BRCA2.  We reviewed the male breast, male breast, ovarian, pancreatic, prostate, and melanoma cancer risks and management strategies associated with BRCA2 mutations. We discussed that testing is beneficial  for several reasons, including knowing about other cancer risks, identifying potential screening and risk-reduction options that may be appropriate, and understanding if other family members could be at an increased risk for cancer and allowing them to undergo genetic testing.  We reviewed the characteristics, features and inheritance patterns of hereditary cancer syndromes. We also discussed genetic testing,  including the appropriate family members to test, the process of testing, insurance coverage and turn-around-time for results. We discussed the implications of a negative, positive, and/or variant of uncertain significant result. We recommended Mr. Coles pursue genetic testing for the BRCA2 gene mutation.  We discussed panel-based testing (billed to insurance as opposed to no charge family variant testing only) is available if he wished to proceed with a more comprehensive test, although the paternal family history was not overly concerning for a paternally inherited hereditary cancer syndrome.  Mr. Dittus requested comprehensive panel-based genetic testing.   The CancerNext-Expanded gene panel offered by [facility] and includes sequencing, rearrangement, and RNA analysis for the following 76 genes: AIP, ALK, APC, ATM, AXIN2, BAP1, BARD1, BMPR1A, BRCA1, BRCA2, BRIP1, CDC73, CDH1, CDK4, CDKN1B, CDKN2A, CEBPA, CHEK2, CTNNA1, DDX41, DICER1, ETV6, FH, FLCN, GATA2, LZTR1, MAX, MBD4, MEN1, MET, MLH1, MSH2, MSH3, MSH6, MUTYH, NF1, NF2, NTHL1, PALB2, PHOX2B, PMS2, POT1, PRKAR1A, PTCH1, PTEN, RAD51C, RAD51D, RB1, RET, RUNX1, SDHA, SDHAF2, SDHB, SDHC, SDHD, SMAD4, SMARCA4, SMARCB1, SMARCE1, STK11, SUFU, TMEM127, TP53, TSC1, TSC2, VHL, and WT1 (sequencing and deletion/duplication); EGFR, HOXB13, KIT, MITF, PDGFRA, POLD1, and POLE (sequencing only); EPCAM and GREM1 (deletion/duplication only).   Based on Mr. Sherod family history of a BRCA2 gene mutation in his mother, he meets medical criteria for genetic testing. Despite that he meets criteria, he may still have an out of pocket cost. We discussed that if his out of pocket cost for testing is over $100, the laboratory should contact them to discuss self-pay options and/or patient pay assistance programs.   We discussed the Genetic Information Non-Discrimination Act (GINA) of 2008, which helps protect individuals against genetic discrimination based on their  genetic test results.  It impacts both health insurance and employment.  With health insurance, it protects against genetic test results being used for increased premiums or policy termination. For employment, it protects against hiring, firing and promoting decisions based on genetic test results.  GINA does not apply to those in the [employer], those who work for companies with less than 15 employees, and new life insurance or long-term disability insurance policies.  Health status due to a cancer diagnosis is not protected under GINA.  PLAN: After considering the risks, benefits, and limitations, Mr. Lemen provided informed consent to pursue genetic testing.  The blood sample will be collected on 4/17 at 11am at the [facility] and sent to [facility] for analysis of the CancerNext-Expanded +RNAinsight Panel. Results should be available within approximately 2-3 weeks' time, at which point they will be disclosed by telephone to Mr. Utsey, as will any additional recommendations warranted by these results. Mr. Kimoto will receive a summary of his genetic counseling visit and a copy of his results once available. This information will also be available in Epic.   Mr. Vicuna questions were answered to his satisfaction today. Our contact information was provided should additional questions or concerns arise.   Yoshi Vicencio M. Rennie Plowman, MS, [facility] Genetic Counselor [email or web address] (Demetrius Charity) [phone]    Mr. Moch was seen in conjunction with his mother's genetic counseling appointment, and less than 15 minutes were spent on the date of the  encounter in service to the patient including preparation, face-to-face consultation, documentation and care coordination.  Drs. Iruku, Gudena and/or Maryalice Smaller were available to discuss this case as needed.   _______________________________________________________________________ For Office Staff:  Number of people involved in session: 2 Was an Intern/ student  involved with case: no

## 2023-06-06 ENCOUNTER — Inpatient Hospital Stay

## 2023-06-06 DIAGNOSIS — Z8481 Family history of carrier of genetic disease: Secondary | ICD-10-CM

## 2023-06-06 LAB — GENETIC SCREENING ORDER

## 2023-06-11 ENCOUNTER — Other Ambulatory Visit: Payer: Self-pay | Admitting: Nurse Practitioner

## 2023-06-12 NOTE — Telephone Encounter (Signed)
 Last OV/CPE 02/22/23 within protocol.  Requested Prescriptions  Pending Prescriptions Disp Refills   empagliflozin  (JARDIANCE ) 10 MG TABS tablet [Pharmacy Med Name: JARDIANCE  10MG  TABLETS] 90 tablet 1    Sig: TAKE 1 TABLET BY MOUTH EVERY DAY BEFORE BREAKFAST     Endocrinology:  Diabetes - SGLT2 Inhibitors Failed - 06/12/2023  8:44 AM      Failed - Valid encounter within last 6 months    Recent Outpatient Visits   None     Future Appointments             In 2 months Aileen Alexanders, NP [facility]            Passed - Cr in normal range and within 360 days    Creat  Date Value Ref Range Status  08/09/2017 1.25 0.60 - 1.35 mg/dL Final   Creatinine, Ser  Date Value Ref Range Status  04/01/2023 1.17 0.61 - 1.24 mg/dL Final         Passed - HBA1C is between 0 and 7.9 and within 180 days    HbA1c, POC (controlled diabetic range)  Date Value Ref Range Status  06/23/2021 5.8 0.0 - 7.0 % Final   Hgb A1c MFr Bld  Date Value Ref Range Status  02/22/2023 5.8 (H) 4.8 - 5.6 % Final    Comment:             Prediabetes: 5.7 - 6.4          Diabetes: >6.4          Glycemic control for adults with diabetes: <7.0          Passed - eGFR in normal range and within 360 days    GFR, Est African American  Date Value Ref Range Status  08/09/2017 85 > OR = 60 mL/min/1.48m2 Final   GFR calc Af Amer  Date Value Ref Range Status  10/28/2019 99 >59 mL/min/1.73 Final    Comment:    **[facility] currently reports eGFR in compliance with the current**   recommendations of the [employer]. [facility] will   update reporting as new guidelines are published from the NKF-ASN   Task force.    GFR, Est Non African American  Date Value Ref Range Status  08/09/2017 74 > OR = 60 mL/min/1.21m2 Final   GFR, Estimated  Date Value Ref Range Status  04/01/2023 >60 >60 mL/min Final    Comment:    (NOTE) Calculated using the CKD-EPI Creatinine Equation  (2021)    eGFR  Date Value Ref Range Status  02/22/2023 81 >59 mL/min/1.73 Final

## 2023-06-27 ENCOUNTER — Other Ambulatory Visit: Payer: Self-pay | Admitting: Nurse Practitioner

## 2023-07-01 ENCOUNTER — Other Ambulatory Visit: Payer: Self-pay

## 2023-07-01 ENCOUNTER — Other Ambulatory Visit: Payer: Self-pay | Admitting: Nurse Practitioner

## 2023-07-01 DIAGNOSIS — E1159 Type 2 diabetes mellitus with other circulatory complications: Secondary | ICD-10-CM

## 2023-07-01 MED ORDER — BENAZEPRIL HCL 5 MG PO TABS: 5.0000 mg | ORAL_TABLET | Freq: Every day | ORAL | 1 refills | Status: DC

## 2023-07-03 ENCOUNTER — Telehealth: Payer: Self-pay | Admitting: Genetic Counselor

## 2023-07-03 ENCOUNTER — Ambulatory Visit: Payer: Self-pay | Admitting: Genetic Counselor

## 2023-07-03 NOTE — Telephone Encounter (Signed)
 LOV 02/22/2023.   Has  upcoming appt 08/27/2023.   A1C in date.  Requested Prescriptions  Pending Prescriptions Disp Refills   Semaglutide , 1 MG/DOSE, (OZEMPIC , 1 MG/DOSE,) 4 MG/3ML SOPN [Pharmacy Med Name: OZEMPIC  1MG  PER DOSE (4MG /3ML) PFP] 6 mL 0    Sig: INJECT 1MG  AS DIRECTED ONCE A WEEK     Endocrinology:  Diabetes - GLP-1 Receptor Agonists - semaglutide  Failed - 07/03/2023  8:10 AM      Failed - HBA1C in normal range and within 180 days    HbA1c, POC (controlled diabetic range)  Date Value Ref Range Status  06/23/2021 5.8 0.0 - 7.0 % Final   Hgb A1c MFr Bld  Date Value Ref Range Status  02/22/2023 5.8 (H) 4.8 - 5.6 % Final    Comment:             Prediabetes: 5.7 - 6.4          Diabetes: >6.4          Glycemic control for adults with diabetes: <7.0          Failed - Valid encounter within last 6 months    Recent Outpatient Visits   None     Future Appointments             In 1 month Devon Alexanders, NP [place] [facility]            Passed - Cr in normal range and within 360 days    Creat  Date Value Ref Range Status  08/09/2017 1.25 0.60 - 1.35 mg/dL Final   Creatinine, Ser  Date Value Ref Range Status  04/01/2023 1.17 0.61 - 1.24 mg/dL Final

## 2023-07-03 NOTE — Progress Notes (Signed)
 HPI:   Mr. Dalsanto was previously seen in the [facility] due to a family history of a BRCA2 gene mutation in his mother and concerns regarding a hereditary predisposition to cancer.    Mr. Malzahn recent genetic test results were disclosed to him by telephone. These results and recommendations are discussed in more detail below.  CANCER HISTORY:  Oncology History   No history exists.    FAMILY HISTORY:  We obtained a detailed, 4-generation family history.  Significant diagnoses are listed below:      Family History  Problem Relation Age of Onset   Diabetes Mother     Breast cancer Mother 47        BRCA2 positive; triple negative   Diabetes Father     Prostate cancer Paternal Uncle     Cancer Other          MGM's siblings (breast in MGMs's sister dx >50; prostate in MGM's brother; unknown cancer in MGM's sister)           Mr. Garde mother had genetic testing for the [facility] CancerNext-Expanded +RNAinsight Panel, which showed a single pathogenic variant in BRCA2 at c.4211delC (p.S1404*).  The CancerNext-Expanded gene panel offered by [facility] and includes sequencing, rearrangement, and RNA analysis for the following 76 genes: AIP, ALK, APC, ATM, AXIN2, BAP1, BARD1, BMPR1A, BRCA1, BRCA2, BRIP1, CDC73, CDH1, CDK4, CDKN1B, CDKN2A, CEBPA, CHEK2, CTNNA1, DDX41, DICER1, ETV6, FH, FLCN, GATA2, LZTR1, MAX, MBD4, MEN1, MET, MLH1, MSH2, MSH3, MSH6, MUTYH, NF1, NF2, NTHL1, PALB2, PHOX2B, PMS2, POT1, PRKAR1A, PTCH1, PTEN, RAD51C, RAD51D, RB1, RET, RUNX1, SDHA, SDHAF2, SDHB, SDHC, SDHD, SMAD4, SMARCA4, SMARCB1, SMARCE1, STK11, SUFU, TMEM127, TP53, TSC1, TSC2, VHL, and WT1 (sequencing and deletion/duplication); EGFR, HOXB13, KIT, MITF, PDGFRA, POLD1, and POLE (sequencing only); EPCAM and GREM1 (deletion/duplication only).      There is no reported Ashkenazi Jewish ancestry. There is no known consanguinity.  GENETIC TEST RESULTS:  The [facility] CancerNext-Expanded  +RNAinsight Panel found no pathogenic mutations.   The CancerNext-Expanded gene panel offered by [facility] and includes sequencing, rearrangement, and RNA analysis for the following 76 genes: AIP, ALK, APC, ATM, AXIN2, BAP1, BARD1, BMPR1A, BRCA1, BRCA2, BRIP1, CDC73, CDH1, CDK4, CDKN1B, CDKN2A, CEBPA, CHEK2, CTNNA1, DDX41, DICER1, ETV6, FH, FLCN, GATA2, LZTR1, MAX, MBD4, MEN1, MET, MLH1, MSH2, MSH3, MSH6, MUTYH, NF1, NF2, NTHL1, PALB2, PHOX2B, PMS2, POT1, PRKAR1A, PTCH1, PTEN, RAD51C, RAD51D, RB1, RET, RUNX1, SDHA, SDHAF2, SDHB, SDHC, SDHD, SMAD4, SMARCA4, SMARCB1, SMARCE1, STK11, SUFU, TMEM127, TP53, TSC1, TSC2, VHL, and WT1 (sequencing and deletion/duplication); EGFR, HOXB13, KIT, MITF, PDGFRA, POLD1, and POLE (sequencing only); EPCAM and GREM1 (deletion/duplication only).   The test report has been scanned into EPIC and is located under the Molecular Pathology section of the Results Review tab.  A portion of the result report is included below for reference. Genetic testing reported out on Jul 03, 2023.     We recommended Mr. Dubicki pursue testing for the familial hereditary cancer gene mutation called BRCA2 c.4211delC (p.Ser1404*). Mr. Betten test was normal and did not reveal the familial mutation. We call this result a true negative result because the cancer-causing mutation was identified in Mr. Allaire family, and he did not inherit it.  Given this negative result, Mr. Schram chances of developing BRCA2-related cancers are the same as they are in the general population.    ADDITIONAL GENETIC TESTING:   Mr. Trager genetic testing was fairly extensive.  If there are additional relevant genes identified to increase cancer risk  that can be analyzed in the future, we would be happy to discuss and coordinate this testing at that time.     CANCER SCREENING RECOMMENDATIONS:  Mr. Harling test result is considered negative (normal).  The BRCA2 gene mutation that was previously found in  his mother was NOT detected in his sample.  His chances of BRCA2-related cancers are expected to similar to that of the general population.  BRCA2-related high risk screening is NOT indicated for Mr. Brodzik based on these results.  An individual's cancer risk and medical management are not determined by genetic test results alone. Overall cancer risk assessment incorporates additional factors, including personal medical history, family history, and any available genetic information that may result in a personalized plan for cancer prevention and surveillance. Therefore, it is recommended he continue to follow the cancer management and screening guidelines provided by his primary healthcare provider.    RECOMMENDATIONS FOR FAMILY MEMBERS:   Since he did not inherit a identifiable mutation in a cancer predisposition gene included on this panel, his children could not have inherited a known mutation from him in one of these genes. Other members of the family may still carry a pathogenic variant in one of these genes that Mr. Braam did not inherit. Based on the family history, we recommend close relatives of his mother, who was found to have a BRCA2 mutation, have genetic counseling and testing. Mr. Hewson can let us  know if we can be of any assistance in coordinating genetic counseling and/or testing for these family members.     FOLLOW-UP:  Cancer genetics is a rapidly advancing field and it is possible that new genetic tests will be appropriate for him and/or his family members in the future. We encourage Mr. Silveria to remain in contact with cancer genetics, so we can update his personal and family histories and let him know of advances in cancer genetics that may benefit this family.   Our contact number was provided.  They are welcome to call us  at anytime with additional questions or concerns.   Coron Rossano M. Ora Billing, MS, [facility] Genetic Counselor [email or web address] (P) [phone]

## 2023-07-03 NOTE — Telephone Encounter (Signed)
 Disclosed negative genetics.  BRCA2 gene mutation that was previously detected in his mother was not detected in his sample. Chances of BRCA2-related cancers are expected to be same as general population.

## 2023-07-11 ENCOUNTER — Encounter: Payer: Self-pay | Admitting: Orthopedic Surgery

## 2023-07-11 ENCOUNTER — Ambulatory Visit (INDEPENDENT_AMBULATORY_CARE_PROVIDER_SITE_OTHER): Admitting: Orthopedic Surgery

## 2023-07-11 VITALS — BP 136/80 | HR 81 | Ht 66.0 in | Wt 174.0 lb

## 2023-07-11 DIAGNOSIS — G8929 Other chronic pain: Secondary | ICD-10-CM | POA: Diagnosis not present

## 2023-07-11 DIAGNOSIS — M25361 Other instability, right knee: Secondary | ICD-10-CM | POA: Diagnosis not present

## 2023-07-11 DIAGNOSIS — M25561 Pain in right knee: Secondary | ICD-10-CM | POA: Diagnosis not present

## 2023-07-11 NOTE — Addendum Note (Signed)
 Addended by: Georgann Kim on: 07/11/2023 09:05 AM   Modules accepted: Orders

## 2023-07-11 NOTE — Progress Notes (Signed)
 New Patient Visit  Assessment: Devon Nyce. is a 43 y.o. male with the following: 1. Instability of right knee joint  Plan: [place]. Has pain, instability and buckling of the right knee.  He does have a history of injury to the right knee, requiring surgery greater than 20 years ago.  Radiographs are without advanced degenerative changes.  On my review, there may be a lesion within the distal femur.  This may be a sequela of the prior surgery.  Nonetheless, he has a lot of buckling sensations, and tenderness along the medial joint line.  He has recurrent effusions.  He has worked with physical therapy.  Medications have not been effective.  At this point, would like to obtain an MRI for further evaluation of the knee, as well as the distal femur.  We have placed the order.  Once the MRI has been complete, we will see him in clinic to discuss the findings.  Follow-up: Return for After MRI.  Subjective:  Chief Complaint  Patient presents with   Knee Pain    Right knee pain   had surgery at the age of 12  when he touches the knee it feels like something is crunching inside and hot to touch at times  had pt over 10 years ago     History of Present Illness: Devon Badal. is a 43 y.o. male who has been referred by Aileen Alexanders, NP for evaluation of right knee pain.  He has progressively worsening pain in his right knee.  He states he had surgery for a distal femur fracture when he was 16.  This healed without issues.  More recently, he states he is having a lot of buckling.  He has has almost caused him to fall.  He also has tenderness and sensitivity to touch over the medial knee.  Pain is diffuse.  He has recurrent effusions.  He has completed physical therapy.  Tylenol  and ibuprofen  are not effective for his pain.  He wants to remain active.  He needs to be mobile in order to complete his job.   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   lithrostripsy  02/19/2001    Family History  Problem Relation Age of Onset   Diabetes Mother    Breast cancer Mother 44       BRCA2 positive; triple negative   Diabetes Father    Prostate cancer Paternal Uncle    Cancer Other        MGM's siblings (breast in MGMs's sister dx >50; prostate in MGM's brother; unknown cancer in MGM's sister)   Social History   Tobacco Use   Smoking status: Never   Smokeless tobacco: Never  Vaping Use   Vaping status: Never Used  Substance Use Topics   Alcohol use: No   Drug use: No    Allergies  Allergen Reactions   Other Rash and Swelling   Bee Venom     swelling   Ciprofloxacin  Itching   Doxycycline  Other (See Comments)    Reports groin swelling after taking pill.  Was hospitalized and received IV doxycycline  without reaction.    Sulfa  Antibiotics     Itching swelling of the hands and knees     Current Meds  Medication Sig   albuterol  (VENTOLIN  HFA) 108 (90 Base) MCG/ACT inhaler Inhale 2 puffs into the lungs every 6 (six) hours as needed for wheezing or shortness of breath.   benazepril  (LOTENSIN ) 5 MG tablet Take 1 tablet (5 mg total) by mouth daily.   benzonatate  (TESSALON ) 100 MG capsule Take 1 capsule (100 mg total) by mouth 2 (two) times daily as needed for cough.   cyclobenzaprine  (FLEXERIL ) 10 MG tablet Take 0.5-1 tablets (5-10 mg total) by mouth 3 (three) times daily as needed.   empagliflozin  (JARDIANCE ) 10 MG TABS tablet TAKE 1 TABLET BY MOUTH EVERY DAY BEFORE BREAKFAST   EPINEPHrine  0.3 mg/0.3 mL IJ SOAJ injection  Inject 0.3 mg into the muscle as needed for anaphylaxis.   fluticasone  (FLONASE ) 50 MCG/ACT nasal spray SPRAY 2 SPRAYS INTO EACH NOSTRIL EVERY DAY   meloxicam  (MOBIC ) 7.5 MG tablet TAKE 1 TABLET(7.5 MG) BY MOUTH DAILY   metFORMIN  (GLUCOPHAGE ) 1000 MG tablet TAKE 1 TABLET(1000 MG) BY MOUTH TWICE DAILY WITH A MEAL   montelukast  (SINGULAIR ) 10 MG tablet TAKE 1 TABLET BY MOUTH EVERYDAY AT BEDTIME   Multiple Vitamin (MULTIVITAMIN WITH MINERALS) TABS Take 1 tablet by mouth daily.   rosuvastatin  (CRESTOR ) 10 MG tablet Take 1 tablet (10 mg total) by mouth daily.   Semaglutide , 1 MG/DOSE, (OZEMPIC , 1 MG/DOSE,) 4 MG/3ML SOPN INJECT 1MG  AS DIRECTED ONCE A WEEK   triamterene -hydrochlorothiazide  (MAXZIDE ) 75-50 MG tablet TAKE 1 TABLET BY MOUTH DAILY    Objective: BP 136/80   Pulse 81   Ht 5\' 6"  (1.676 m)   Wt 174 lb (78.9 kg)   BMI 28.08 kg/m   Physical Exam:  General: Alert and oriented. and No acute distress. Gait: Right sided antalgic gait.  Evaluation of the right knee demonstrates a mild effusion.  Distal femur surgical incisions are well-healed.  Questionable laxity on ACL testing, although he is guarding.  Mild discomfort with hyperflexion.  Tenderness to palpation along the medial joint line.  Range of motion from 0-130 degrees.  IMAGING: I personally reviewed images previously obtained in clinic  X-rays of the right knee were available in clinic today.  AP and lateral views.  Minimal degenerative changes.  There is a questionable lucency in the distal femur, which is visible on both the AP and lateral views.  Mild distal femur deformity   New Medications:  No orders of the defined types were placed in this encounter.     Tonita Frater, MD  07/11/2023 9:00 AM

## 2023-07-13 ENCOUNTER — Ambulatory Visit
Admission: RE | Admit: 2023-07-13 | Discharge: 2023-07-13 | Disposition: A | Discharge status: Home / Self Care | Source: Ambulatory Visit | Attending: Orthopedic Surgery

## 2023-07-13 DIAGNOSIS — M25361 Other instability, right knee: Secondary | ICD-10-CM

## 2023-07-13 DIAGNOSIS — G8929 Other chronic pain: Secondary | ICD-10-CM

## 2023-08-01 ENCOUNTER — Encounter: Payer: Self-pay | Admitting: Orthopedic Surgery

## 2023-08-18 ENCOUNTER — Other Ambulatory Visit: Payer: Self-pay | Admitting: Nurse Practitioner

## 2023-08-18 DIAGNOSIS — S39012A Strain of muscle, fascia and tendon of lower back, initial encounter: Secondary | ICD-10-CM

## 2023-08-20 NOTE — Telephone Encounter (Signed)
 Requested medications are due for refill today.  yes  Requested medications are on the active medications list.  yes  Last refill. 03/18/2023 #30 0 rf  Future visit scheduled.   yes  Notes to clinic.  Refill not delegated.    Requested Prescriptions  Pending Prescriptions Disp Refills   cyclobenzaprine  (FLEXERIL ) 10 MG tablet [Pharmacy Med Name: CYCLOBENZAPRINE  10MG  TABLETS] 30 tablet 0    Sig: TAKE 1/2 TO 1 TABLET(5 TO 10 MG) BY MOUTH THREE TIMES DAILY AS NEEDED     Not Delegated - Analgesics:  Muscle Relaxants Failed - 08/20/2023  9:11 AM      Failed - This refill cannot be delegated      Failed - Valid encounter within last 6 months    Recent Outpatient Visits   None     Future Appointments             In 1 week Melvin Pao, NP  [facility]

## 2023-08-27 ENCOUNTER — Telehealth: Payer: Self-pay

## 2023-08-27 ENCOUNTER — Other Ambulatory Visit: Payer: Self-pay | Admitting: Nurse Practitioner

## 2023-08-27 ENCOUNTER — Encounter: Payer: Self-pay | Admitting: Nurse Practitioner

## 2023-08-27 ENCOUNTER — Ambulatory Visit: Payer: Self-pay | Admitting: Nurse Practitioner

## 2023-08-27 VITALS — BP 104/66 | HR 78 | Temp 98.7°F | Resp 15 | Ht 65.98 in | Wt 171.4 lb

## 2023-08-27 DIAGNOSIS — I152 Hypertension secondary to endocrine disorders: Secondary | ICD-10-CM | POA: Diagnosis not present

## 2023-08-27 DIAGNOSIS — E785 Hyperlipidemia, unspecified: Secondary | ICD-10-CM | POA: Diagnosis not present

## 2023-08-27 DIAGNOSIS — E1159 Type 2 diabetes mellitus with other circulatory complications: Secondary | ICD-10-CM | POA: Diagnosis not present

## 2023-08-27 DIAGNOSIS — Z7985 Long-term (current) use of injectable non-insulin antidiabetic drugs: Secondary | ICD-10-CM

## 2023-08-27 DIAGNOSIS — E1169 Type 2 diabetes mellitus with other specified complication: Secondary | ICD-10-CM

## 2023-08-27 MED ORDER — ROSUVASTATIN CALCIUM 10 MG PO TABS: 10.0000 mg | ORAL_TABLET | Freq: Every day | ORAL | 1 refills | Status: DC

## 2023-08-27 MED ORDER — BENAZEPRIL HCL 5 MG PO TABS: 5.0000 mg | ORAL_TABLET | Freq: Every day | ORAL | 1 refills | Status: DC

## 2023-08-27 MED ORDER — METFORMIN HCL 500 MG PO TABS: 500.0000 mg | ORAL_TABLET | Freq: Every day | ORAL | 1 refills | Status: DC

## 2023-08-27 MED ORDER — MONTELUKAST SODIUM 10 MG PO TABS: ORAL_TABLET | ORAL | 1 refills | Status: AC

## 2023-08-27 MED ORDER — TRIAMTERENE-HCTZ 75-50 MG PO TABS: 1.0000 | ORAL_TABLET | Freq: Every day | ORAL | 1 refills | Status: DC

## 2023-08-27 MED ORDER — OZEMPIC (1 MG/DOSE) 4 MG/3ML ~~LOC~~ SOPN: 1.0000 mg | PEN_INJECTOR | SUBCUTANEOUS | 1 refills | Status: DC

## 2023-08-27 MED ORDER — EMPAGLIFLOZIN 10 MG PO TABS: 10.0000 mg | ORAL_TABLET | Freq: Every day | ORAL | 1 refills | Status: DC

## 2023-08-27 NOTE — Telephone Encounter (Signed)
 Medication has 2 sets of directions. Please clarify and resend to the pharmacy.

## 2023-08-27 NOTE — Telephone Encounter (Signed)
 Metformin

## 2023-08-27 NOTE — Telephone Encounter (Signed)
Order fixed.  

## 2023-08-27 NOTE — Assessment & Plan Note (Signed)
 Chronic.  Controlled.  Continue with current medication regimen of Maxide and Benzapril (on for kidney protection).  Refills sent today.  Labs ordered today.  Return to clinic in 6 months for reevaluation.  Call sooner if concerns arise.

## 2023-08-27 NOTE — Telephone Encounter (Signed)
 Copied from CRM [id number]. Topic: Clinical - Prescription Issue >> Aug 27, 2023  9:25 AM Rosaria BRAVO wrote: Reason for CRM: Medication clarification needed for the patient's metformin    Please contact  [id number] [place], [place] - [address] AT [facility] OF [address] [facility] [address] [place], [place] [address] Phone: [phone]  Fax: [phone]

## 2023-08-27 NOTE — Progress Notes (Signed)
 BP 104/66   Pulse 78   Temp 98.7 F (37.1 C) (Oral)   Resp 15   Ht 5' 5.98 (1.676 m)   Wt 171 lb 6.4 oz (77.7 kg)   SpO2 97%   BMI 27.68 kg/m    Subjective:    Patient ID: Devon MARLA Vicci Mickey., male    DOB: [birth year], 43 y.o.   MRN: [id number]  HPI: Devon Mondor. is a 43 y.o. male  Chief Complaint  Patient presents with   Diabetes    Does not check it at home   Hypertension    Does not check it at home.    Hyperlipidemia    Does wonder if he should be taking daily as he is currently taking every other day.    HYPERTENSION / HYPERLIPIDEMIA Satisfied with current treatment? yes Duration of hypertension: years BP monitoring frequency: not checking BP range:  BP medication side effects: no Past BP meds: Triameterne, HCTZ, and Benzapril Duration of hyperlipidemia: years Cholesterol medication side effects: yes Cholesterol supplements: none Past cholesterol medications: crestor  Medication compliance: excellent compliance Aspirin: no Recent stressors: no Recurrent headaches: no Visual changes: no Palpitations: no Dyspnea: no Chest pain: no Lower extremity edema: no Dizzy/lightheaded: no   DIABETES Hypoglycemic episodes:no Polydipsia/polyuria: no Visual disturbance: no Chest pain: no Paresthesias: no Glucose Monitoring: no  Accucheck frequency: Not Checking  Fasting glucose:  Post prandial:  Evening:  Before meals: Taking Insulin?: no  Long acting insulin:  Short acting insulin: Blood Pressure Monitoring: not checking Retinal Examination: Up to Date Foot Exam: Up to Date Diabetic Education: Not Completed Pneumovax: Up to Date Influenza: Up to Date Aspirin: no   Relevant past medical, surgical, family and social history reviewed and updated as indicated. Interim medical history since our last visit reviewed. Allergies and medications reviewed and updated.  Review of Systems  Eyes:  Negative for visual disturbance.  Respiratory:   Negative for chest tightness and shortness of breath.   Cardiovascular:  Negative for chest pain, palpitations and leg swelling.  Endocrine: Negative for polydipsia and polyuria.  Neurological:  Negative for dizziness, light-headedness, numbness and headaches.    Per HPI unless specifically indicated above     Objective:    BP 104/66   Pulse 78   Temp 98.7 F (37.1 C) (Oral)   Resp 15   Ht 5' 5.98 (1.676 m)   Wt 171 lb 6.4 oz (77.7 kg)   SpO2 97%   BMI 27.68 kg/m   Wt Readings from Last 3 Encounters:  08/27/23 171 lb 6.4 oz (77.7 kg)  07/11/23 174 lb (78.9 kg)  04/01/23 175 lb (79.4 kg)    Physical Exam Vitals and nursing note reviewed.  Constitutional:      General: He is not in acute distress.    Appearance: Normal appearance. He is not ill-appearing, toxic-appearing or diaphoretic.  HENT:     Head: Normocephalic.     Right Ear: External ear normal.     Left Ear: External ear normal.     Nose: Nose normal. No congestion or rhinorrhea.     Mouth/Throat:     Mouth: Mucous membranes are moist.  Eyes:     General:        Right eye: No discharge.        Left eye: No discharge.     Extraocular Movements: Extraocular movements intact.     Conjunctiva/sclera: Conjunctivae normal.     Pupils: Pupils are equal, round, and reactive  to light.  Cardiovascular:     Rate and Rhythm: Normal rate and regular rhythm.     Heart sounds: No murmur heard. Pulmonary:     Effort: Pulmonary effort is normal. No respiratory distress.     Breath sounds: Normal breath sounds. No wheezing, rhonchi or rales.  Abdominal:     General: Abdomen is flat. Bowel sounds are normal.  Musculoskeletal:     Cervical back: Normal range of motion and neck supple.  Skin:    General: Skin is warm and dry.     Capillary Refill: Capillary refill takes less than 2 seconds.  Neurological:     General: No focal deficit present.     Mental Status: He is alert and oriented to person, place, and time.   Psychiatric:        Mood and Affect: Mood normal.        Behavior: Behavior normal.        Thought Content: Thought content normal.        Judgment: Judgment normal.     Results for orders placed or performed in visit on 06/06/23  Genetic Screening Order   Collection Time: 06/06/23 10:56 AM  Result Value Ref Range   Genetic Screening Order Collected by Laboratory       Assessment & Plan:   Problem List Items Addressed This Visit       Cardiovascular and Mediastinum   Hypertension associated with type 2 diabetes mellitus (HCC)   Chronic.  Controlled.  Continue with current medication regimen of Maxide and Benzapril (on for kidney protection).  Refills sent today.  Labs ordered today.  Return to clinic in 6 months for reevaluation.  Call sooner if concerns arise.       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 Patient doing well with Ozempic .  Has lost 3lbs since last visit.  Continue with Ozempic , Metformin  and Jardiance .  Doing well and tolerating medication well.  He has only been taking Metformin  500mg .  Will stop Metformin  500mg .  A1c has been 5.8%.   Continue with low carb diet and exercise.  Follow up in 6 months.  Call sooner if concerns arise.       Relevant Medications   benazepril  (LOTENSIN ) 5 MG tablet   empagliflozin  (JARDIANCE ) 10 MG TABS tablet   metFORMIN  (GLUCOPHAGE ) 500 MG tablet   rosuvastatin  (CRESTOR ) 10 MG tablet   Semaglutide , 1 MG/DOSE, (OZEMPIC , 1 MG/DOSE,) 4 MG/3ML SOPN   triamterene -hydrochlorothiazide  (MAXZIDE ) 75-50 MG tablet   Other Relevant Orders   Hemoglobin A1c   Comprehensive metabolic panel with GFR      Endocrine   Hyperlipidemia associated with type 2 diabetes mellitus (HCC)   Chronic.  Controlled.  Rosuvastatin  increased to 10mg .   Refills sent today.  Labs ordered today.  Return to clinic in 6 months for reevaluation.  Call sooner if concerns arise.       Relevant Medications   benazepril  (LOTENSIN ) 5 MG tablet   empagliflozin  (JARDIANCE ) 10 MG TABS tablet   metFORMIN  (GLUCOPHAGE ) 500 MG tablet   rosuvastatin  (CRESTOR ) 10 MG tablet   Semaglutide , 1 MG/DOSE, (OZEMPIC , 1 MG/DOSE,) 4 MG/3ML SOPN  triamterene -hydrochlorothiazide  (MAXZIDE ) 75-50 MG tablet   Other Relevant Orders   Lipid panel     Follow up plan: No follow-ups on file.

## 2023-08-27 NOTE — Assessment & Plan Note (Signed)
 Chronic.  Controlled.  Rosuvastatin increased to 10mg .   Refills sent today.  Labs ordered today.  Return to clinic in 6 months for reevaluation.  Call sooner if concerns arise.

## 2023-08-27 NOTE — Assessment & Plan Note (Signed)
 Chronic. Patient doing well with Ozempic .  Has lost 3lbs since last visit.  Continue with Ozempic , Metformin  and Jardiance .  Doing well and tolerating medication well.  He has only been taking Metformin  500mg .  Will stop Metformin  500mg .  A1c has been 5.8%.   Continue with low carb diet and exercise.  Follow up in 6 months.  Call sooner if concerns arise.

## 2023-08-28 ENCOUNTER — Ambulatory Visit: Payer: Self-pay | Admitting: Nurse Practitioner

## 2023-08-28 LAB — COMPREHENSIVE METABOLIC PANEL WITH GFR
ALT: 27 IU/L (ref 0–44)
AST: 20 IU/L (ref 0–40)
Albumin: 4.6 g/dL (ref 4.1–5.1)
Alkaline Phosphatase: 75 IU/L (ref 44–121)
BUN/Creatinine Ratio: 7 — ABNORMAL LOW (ref 9–20)
BUN: 8 mg/dL (ref 6–24)
Bilirubin Total: 0.7 mg/dL (ref 0.0–1.2)
CO2: 24 mmol/L (ref 20–29)
Calcium: 9.7 mg/dL (ref 8.7–10.2)
Chloride: 100 mmol/L (ref 96–106)
Creatinine, Ser: 1.08 mg/dL (ref 0.76–1.27)
Globulin, Total: 2.1 g/dL (ref 1.5–4.5)
Glucose: 86 mg/dL (ref 70–99)
Potassium: 3.9 mmol/L (ref 3.5–5.2)
Sodium: 141 mmol/L (ref 134–144)
Total Protein: 6.7 g/dL (ref 6.0–8.5)
eGFR: 88 mL/min/1.73 (ref >59)

## 2023-08-28 LAB — LIPID PANEL
Chol/HDL Ratio: 2.7 ratio (ref 0.0–5.0)
Cholesterol, Total: 135 mg/dL (ref 100–199)
HDL: 50 mg/dL (ref >39)
LDL Chol Calc (NIH): 66 mg/dL (ref 0–99)
Triglycerides: 106 mg/dL (ref 0–149)
VLDL Cholesterol Cal: 19 mg/dL (ref 5–40)

## 2023-08-28 LAB — HEMOGLOBIN A1C
Est. average glucose Bld gHb Est-mCnc: 114 mg/dL
Hgb A1c MFr Bld: 5.6 % (ref 4.8–5.6)

## 2023-08-29 NOTE — Telephone Encounter (Signed)
 Too soon for refill, duplicate request.  Requested Prescriptions  Pending Prescriptions Disp Refills   OZEMPIC , 1 MG/DOSE, 4 MG/3ML SOPN [Pharmacy Med Name: OZEMPIC  1MG  PER DOSE (4MG /3ML) PFP] 6 mL 1    Sig: INJECT 1MG  INTO THE SKIN ONCE A WEEK AS DIRECTED     Endocrinology:  Diabetes - GLP-1 Receptor Agonists - semaglutide  Passed - 08/29/2023 12:28 PM      Passed - HBA1C in normal range and within 180 days    HbA1c, POC (controlled diabetic range)  Date Value Ref Range Status  06/23/2021 5.8 0.0 - 7.0 % Final   Hgb A1c MFr Bld  Date Value Ref Range Status  08/27/2023 5.6 4.8 - 5.6 % Final    Comment:             Prediabetes: 5.7 - 6.4          Diabetes: >6.4          Glycemic control for adults with diabetes: <7.0          Passed - Cr in normal range and within 360 days    Creat  Date Value Ref Range Status  08/09/2017 1.25 0.60 - 1.35 mg/dL Final   Creatinine, Ser  Date Value Ref Range Status  08/27/2023 1.08 0.76 - 1.27 mg/dL Final         Passed - Valid encounter within last 6 months    Recent Outpatient Visits           2 days ago Type 2 diabetes mellitus with vascular disease (HCC)    [facility] Melvin Pao, NP       Future Appointments             In 1 month Onesimo Oneil LABOR, MD [facility]

## 2023-09-08 ENCOUNTER — Telehealth: Admitting: Family

## 2023-09-08 ENCOUNTER — Encounter: Payer: Self-pay | Admitting: Nurse Practitioner

## 2023-09-08 DIAGNOSIS — J019 Acute sinusitis, unspecified: Secondary | ICD-10-CM | POA: Diagnosis not present

## 2023-09-08 MED ORDER — PREDNISONE 10 MG (21) PO TBPK: ORAL_TABLET | ORAL | 0 refills | Status: DC

## 2023-09-08 NOTE — Patient Instructions (Signed)

## 2023-09-08 NOTE — Progress Notes (Signed)
 Virtual Visit Consent   Fed K Harl Jr., you are scheduled for a virtual visit with a [facility] provider today. Just as with appointments in the office, your consent must be obtained to participate. Your consent will be active for this visit and any virtual visit you may have with one of our providers in the next 365 days. If you have a MyChart account, a copy of this consent can be sent to you electronically.  As this is a virtual visit, video technology does not allow for your provider to perform a traditional examination. This may limit your provider's ability to fully assess your condition. If your provider identifies any concerns that need to be evaluated in person or the need to arrange testing (such as labs, EKG, etc.), we will make arrangements to do so. Although advances in technology are sophisticated, we cannot ensure that it will always work on either your end or our end. If the connection with a video visit is poor, the visit may have to be switched to a telephone visit. With either a video or telephone visit, we are not always able to ensure that we have a secure connection.  By engaging in this virtual visit, you consent to the provision of healthcare and authorize for your insurance to be billed (if applicable) for the services provided during this visit. Depending on your insurance coverage, you may receive a charge related to this service.  I need to obtain your verbal consent now. Are you willing to proceed with your visit today? [name] has provided verbal consent on 09/08/2023 for a virtual visit (video or telephone). Bari Learn, FNP  Date: 09/08/2023 12:40 PM   Virtual Visit via Video Note   I, Bari Learn, connected with  Devon Macdonald.  ([id number], Dec 04, 1980) on 09/08/23 at 12:30 PM EDT by a video-enabled telemedicine application and verified that I am speaking with the correct person using two identifiers.  Location: Patient: Virtual Visit  Location Patient: Other: outside Provider: Virtual Visit Location Provider: Home Office   I discussed the limitations of evaluation and management by telemedicine and the availability of in person appointments. The patient expressed understanding and agreed to proceed.    History of Present Illness: Devon Macdonald. is a 43 y.o. who identifies as a male who was assigned male at birth, and is being seen today for sinus congestion for 3-5 days.  HPI: Sinusitis This is a new problem. The current episode started in the past 7 days. The problem has been gradually worsening since onset. There has been no fever. His pain is at a severity of 3/10. The pain is mild. Associated symptoms include congestion, coughing, headaches, sinus pressure and sneezing. Pertinent negatives include no ear pain, shortness of breath, sore throat or swollen glands. Past treatments include oral decongestants, acetaminophen  and saline nose sprays. The treatment provided moderate relief.    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   Elevated LFTs 11/10/2019   Flank pain 10/28/2019   OSA (obstructive sleep apnea) 10/20/2019   Cervical strain, acute 08/25/2019   Snoring 03/30/2019   Cough 06/23/2014   Muscle spasms of neck 06/23/2014   Migraine headache 07/04/2011   Abnormal TSH 07/04/2011   Hyperlipidemia associated with type 2 diabetes mellitus (HCC) 06/13/2007   Hypertension associated with type 2 diabetes mellitus (  HCC) 06/13/2007    Allergies:  Allergies  Allergen Reactions   Other Rash and Swelling   Bee Venom     swelling   Ciprofloxacin Itching   Doxycycline  Other (See Comments)    Reports groin swelling after taking pill.  Was hospitalized and received IV doxycycline  without reaction.    Sulfa  Antibiotics     Itching swelling of the hands and knees     Medications:  Current Outpatient Medications:    predniSONE  (STERAPRED UNI-PAK 21 TAB) 10 MG (21) TBPK tablet, Use as directed, Disp: 21 tablet, Rfl: 0   albuterol  (VENTOLIN  HFA) 108 (90 Base) MCG/ACT inhaler, Inhale 2 puffs into the lungs every 6 (six) hours as needed for wheezing or shortness of breath., Disp: 8 g, Rfl: 0   benazepril  (LOTENSIN ) 5 MG tablet, Take 1 tablet (5 mg total) by mouth daily., Disp: 90 tablet, Rfl: 1   benzonatate  (TESSALON ) 100 MG capsule, Take 1 capsule (100 mg total) by mouth 2 (two) times daily as needed for cough., Disp: 20 capsule, Rfl: 0   empagliflozin  (JARDIANCE ) 10 MG TABS tablet, Take 1 tablet (10 mg total) by mouth daily., Disp: 90 tablet, Rfl: 1   EPINEPHrine  0.3 mg/0.3 mL IJ SOAJ injection, Inject 0.3 mg into the muscle as needed for anaphylaxis., Disp: 1 each, Rfl: 3   fluticasone  (FLONASE ) 50 MCG/ACT nasal spray, SPRAY 2 SPRAYS INTO EACH NOSTRIL EVERY DAY, Disp: 48 mL, Rfl: 1   meloxicam  (MOBIC ) 7.5 MG tablet, TAKE 1 TABLET(7.5 MG) BY MOUTH DAILY, Disp: 90 tablet, Rfl: 1   metFORMIN  (GLUCOPHAGE ) 500 MG tablet, Take 1 tablet (500 mg total) by mouth daily with breakfast., Disp: 90 tablet, Rfl: 1   montelukast  (SINGULAIR ) 10 MG tablet, TAKE 1 TABLET BY MOUTH EVERYDAY AT BEDTIME, Disp: 90 tablet, Rfl: 1   Multiple Vitamin (MULTIVITAMIN WITH MINERALS) TABS, Take 1 tablet by mouth daily., Disp: , Rfl:    rosuvastatin  (CRESTOR ) 10 MG tablet, Take 1 tablet (10 mg total) by mouth daily., Disp: 90 tablet, Rfl: 1   Semaglutide , 1 MG/DOSE, (OZEMPIC , 1 MG/DOSE,) 4 MG/3ML SOPN, Inject 1 mg into the skin once a week., Disp: 6 mL, Rfl: 1   triamterene -hydrochlorothiazide  (MAXZIDE ) 75-50 MG tablet, Take 1 tablet by mouth daily., Disp: 90 tablet, Rfl: 1  Observations/Objective: Patient is well-developed, well-nourished in no acute distress.  Resting comfortably  at home.  Head is normocephalic, atraumatic.  No labored breathing.  Speech is clear and coherent with  logical content.  Patient is alert and oriented at baseline.  Sinus pressure in maxillary   Assessment and Plan: 1. Acute sinusitis, recurrence not specified, unspecified location (Primary) - predniSONE  (STERAPRED UNI-PAK 21 TAB) 10 MG (21) TBPK tablet; Use as directed  Dispense: 21 tablet; Refill: 0  - Take meds as prescribed - Use a cool mist humidifier  -Use saline nose sprays frequently -Force fluids -For any cough or congestion  Use plain Mucinex- regular strength or max strength is fine -For fever or aces or pains- take tylenol  or ibuprofen . -Throat lozenges if help Continue zyrtec, Singulair , and flonase    - Follow up if symptoms worsen or do not improve   Follow Up Instructions: I discussed the assessment and treatment plan with the patient. The patient was provided an opportunity to ask questions and all were answered. The patient agreed with the plan and demonstrated an understanding of the instructions.  A copy of instructions were sent to the patient via MyChart unless otherwise noted below.  The patient was advised to call back or seek an in-person evaluation if the symptoms worsen or if the condition fails to improve as anticipated.    Bari Learn, FNP

## 2023-09-10 ENCOUNTER — Telehealth: Payer: Self-pay

## 2023-09-15 ENCOUNTER — Telehealth: Admitting: Physician Assistant

## 2023-09-15 DIAGNOSIS — R300 Dysuria: Secondary | ICD-10-CM

## 2023-09-15 NOTE — Patient Instructions (Signed)
 Devon Macdonald., thank you for joining [name], PA-C for today's virtual visit.  While this provider is not your primary care provider (PCP), if your PCP is located in our provider database this encounter information will be shared with them immediately following your visit.   A [facility] MyChart account gives you access to today's visit and all your visits, tests, and labs performed at [facility]  click here if you don't have a [facility] MyChart account or go to [email or web address]  Consent: (Patient) Devon Macdonald. provided verbal consent for this virtual visit at the beginning of the encounter.  Current Medications:  Current Outpatient Medications:    albuterol  (VENTOLIN  HFA) 108 (90 Base) MCG/ACT inhaler, Inhale 2 puffs into the lungs every 6 (six) hours as needed for wheezing or shortness of breath., Disp: 8 g, Rfl: 0   benazepril  (LOTENSIN ) 5 MG tablet, Take 1 tablet (5 mg total) by mouth daily., Disp: 90 tablet, Rfl: 1   benzonatate  (TESSALON ) 100 MG capsule, Take 1 capsule (100 mg total) by mouth 2 (two) times daily as needed for cough., Disp: 20 capsule, Rfl: 0   empagliflozin  (JARDIANCE ) 10 MG TABS tablet, Take 1 tablet (10 mg total) by mouth daily., Disp: 90 tablet, Rfl: 1   EPINEPHrine  0.3 mg/0.3 mL IJ SOAJ injection, Inject 0.3 mg into the muscle as needed for anaphylaxis., Disp: 1 each, Rfl: 3   fluticasone  (FLONASE ) 50 MCG/ACT nasal spray, SPRAY 2 SPRAYS INTO EACH NOSTRIL EVERY DAY, Disp: 48 mL, Rfl: 1   meloxicam  (MOBIC ) 7.5 MG tablet, TAKE 1 TABLET(7.5 MG) BY MOUTH DAILY, Disp: 90 tablet, Rfl: 1   metFORMIN  (GLUCOPHAGE ) 500 MG tablet, Take 1 tablet (500 mg total) by mouth daily with breakfast., Disp: 90 tablet, Rfl: 1   montelukast  (SINGULAIR ) 10 MG tablet, TAKE 1 TABLET BY MOUTH EVERYDAY AT BEDTIME, Disp: 90 tablet, Rfl: 1   Multiple Vitamin (MULTIVITAMIN WITH MINERALS) TABS, Take 1 tablet by mouth daily., Disp: , Rfl:    predniSONE   (STERAPRED UNI-PAK 21 TAB) 10 MG (21) TBPK tablet, Use as directed, Disp: 21 tablet, Rfl: 0   rosuvastatin  (CRESTOR ) 10 MG tablet, Take 1 tablet (10 mg total) by mouth daily., Disp: 90 tablet, Rfl: 1   Semaglutide , 1 MG/DOSE, (OZEMPIC , 1 MG/DOSE,) 4 MG/3ML SOPN, Inject 1 mg into the skin once a week., Disp: 6 mL, Rfl: 1   triamterene -hydrochlorothiazide  (MAXZIDE ) 75-50 MG tablet, Take 1 tablet by mouth daily., Disp: 90 tablet, Rfl: 1   Medications ordered in this encounter:  No orders of the defined types were placed in this encounter.    *If you need refills on other medications prior to your next appointment, please contact your pharmacy*  Follow-Up: Call back or seek an in-person evaluation if the symptoms worsen or if the condition fails to improve as anticipated.  [facility] [phone]  Other Instructions Report to Urgent Care or ER for evaluation.    If you have been instructed to have an in-person evaluation today at a local Urgent Care facility, please use the link below. It will take you to a list of all of our available [facility], including address, phone number and hours of operation. Please do not delay care.  [facility]  If you or a family member do not have a primary care provider, use the link below to schedule a visit and establish care. When you choose a [facility] primary care physician  or advanced practice provider, you gain a long-term partner in health. Find a Primary Care Provider  Learn more about [facility]'s in-office and virtual care options: [facility] - Get Care Now

## 2023-09-15 NOTE — Progress Notes (Signed)
 Virtual Visit Consent   Devon K Meiser Jr., you are scheduled for a virtual visit with a [facility] provider today. Just as with appointments in the office, your consent must be obtained to participate. Your consent will be active for this visit and any virtual visit you may have with one of our providers in the next 365 days. If you have a MyChart account, a copy of this consent can be sent to you electronically.  As this is a virtual visit, video technology does not allow for your provider to perform a traditional examination. This may limit your provider's ability to fully assess your condition. If your provider identifies any concerns that need to be evaluated in person or the need to arrange testing (such as labs, EKG, etc.), we will make arrangements to do so. Although advances in technology are sophisticated, we cannot ensure that it will always work on either your end or our end. If the connection with a video visit is poor, the visit may have to be switched to a telephone visit. With either a video or telephone visit, we are not always able to ensure that we have a secure connection.  By engaging in this virtual visit, you consent to the provision of healthcare and authorize for your insurance to be billed (if applicable) for the services provided during this visit. Depending on your insurance coverage, you may receive a charge related to this service.  I need to obtain your verbal consent now. Are you willing to proceed with your visit today? Devon MARLA Vicci Mickey. has provided verbal consent on 09/15/2023 for a virtual visit (video or telephone). [name], [place]  Date: 09/15/2023 4:04 PM   Virtual Visit via Video Note   I, [name], connected with  Devon Macdonald.  ([id number], 04-03-1980) on 09/15/23 at  4:00 PM EDT by a video-enabled telemedicine application and verified that I am speaking with the correct person using two identifiers.  Location: Patient: Virtual Visit  Location Patient: Home Provider: Virtual Visit Location Provider: Home Office   I discussed the limitations of evaluation and management by telemedicine and the availability of in person appointments. The patient expressed understanding and agreed to proceed.    History of Present Illness: Devon Pousson. is a 43 y.o. who identifies as a male who was assigned male at birth, and is being seen today for dysuria.  HPI: HPI  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   Elevated LFTs 11/10/2019   Flank pain 10/28/2019   OSA (obstructive sleep apnea) 10/20/2019   Cervical strain, acute 08/25/2019   Snoring 03/30/2019   Cough 06/23/2014   Muscle spasms of neck 06/23/2014   Migraine headache 07/04/2011   Abnormal TSH 07/04/2011   Hyperlipidemia associated with type 2 diabetes mellitus (HCC) 06/13/2007   Hypertension associated with type 2 diabetes mellitus (HCC) 06/13/2007    Allergies:  Allergies  Allergen Reactions   Other Rash and Swelling   Bee Venom     swelling   Ciprofloxacin Itching   Doxycycline  Other (See Comments)    Reports groin swelling after taking pill.  Was hospitalized and received IV doxycycline  without reaction.    Sulfa  Antibiotics     Itching swelling of the hands and knees    Medications:  Current Outpatient Medications:    albuterol  (VENTOLIN  HFA)  108 (90 Base) MCG/ACT inhaler, Inhale 2 puffs into the lungs every 6 (six) hours as needed for wheezing or shortness of breath., Disp: 8 g, Rfl: 0   benazepril  (LOTENSIN ) 5 MG tablet, Take 1 tablet (5 mg total) by mouth daily., Disp: 90 tablet, Rfl: 1   benzonatate  (TESSALON ) 100 MG capsule, Take 1 capsule (100 mg total) by mouth 2 (two) times daily as needed for cough., Disp: 20 capsule, Rfl: 0   empagliflozin  (JARDIANCE ) 10 MG TABS tablet, Take 1 tablet (10 mg total) by mouth  daily., Disp: 90 tablet, Rfl: 1   EPINEPHrine  0.3 mg/0.3 mL IJ SOAJ injection, Inject 0.3 mg into the muscle as needed for anaphylaxis., Disp: 1 each, Rfl: 3   fluticasone  (FLONASE ) 50 MCG/ACT nasal spray, SPRAY 2 SPRAYS INTO EACH NOSTRIL EVERY DAY, Disp: 48 mL, Rfl: 1   meloxicam  (MOBIC ) 7.5 MG tablet, TAKE 1 TABLET(7.5 MG) BY MOUTH DAILY, Disp: 90 tablet, Rfl: 1   metFORMIN  (GLUCOPHAGE ) 500 MG tablet, Take 1 tablet (500 mg total) by mouth daily with breakfast., Disp: 90 tablet, Rfl: 1   montelukast  (SINGULAIR ) 10 MG tablet, TAKE 1 TABLET BY MOUTH EVERYDAY AT BEDTIME, Disp: 90 tablet, Rfl: 1   Multiple Vitamin (MULTIVITAMIN WITH MINERALS) TABS, Take 1 tablet by mouth daily., Disp: , Rfl:    predniSONE  (STERAPRED UNI-PAK 21 TAB) 10 MG (21) TBPK tablet, Use as directed, Disp: 21 tablet, Rfl: 0   rosuvastatin  (CRESTOR ) 10 MG tablet, Take 1 tablet (10 mg total) by mouth daily., Disp: 90 tablet, Rfl: 1   Semaglutide , 1 MG/DOSE, (OZEMPIC , 1 MG/DOSE,) 4 MG/3ML SOPN, Inject 1 mg into the skin once a week., Disp: 6 mL, Rfl: 1   triamterene -hydrochlorothiazide  (MAXZIDE ) 75-50 MG tablet, Take 1 tablet by mouth daily., Disp: 90 tablet, Rfl: 1  Observations/Objective: Patient is well-developed, well-nourished in no acute distress.  Resting comfortably  at home.  Head is normocephalic, atraumatic.  No labored breathing.  Speech is clear and coherent with logical content.  Patient is alert and oriented at baseline.    Assessment and Plan: 1. Dysuria (Primary)  Advised patient that he would need to be seen in person for evaluation of dysuria. Advised that urgent care is an option for today as well. No urgent or emergent conditions noted during visit. Patient stable without any acute distress.   Follow Up Instructions: I discussed the assessment and treatment plan with the patient. The patient was provided an opportunity to ask questions and all were answered. The patient agreed with the plan and  demonstrated an understanding of the instructions.  A copy of instructions were sent to the patient via MyChart unless otherwise noted below.    The patient was advised to call back or seek an in-person evaluation if the symptoms worsen or if the condition fails to improve as anticipated.    [name], PA-C

## 2023-09-19 ENCOUNTER — Encounter: Payer: Self-pay | Admitting: Nurse Practitioner

## 2023-09-20 MED ORDER — CYCLOBENZAPRINE HCL 10 MG PO TABS: 10.0000 mg | ORAL_TABLET | Freq: Three times a day (TID) | ORAL | 2 refills | Status: AC | PRN

## 2023-09-28 ENCOUNTER — Other Ambulatory Visit: Payer: Self-pay | Admitting: Nurse Practitioner

## 2023-09-28 DIAGNOSIS — I152 Hypertension secondary to endocrine disorders: Secondary | ICD-10-CM

## 2023-10-01 NOTE — Telephone Encounter (Signed)
 Rx 08/27/23 #90 1RF- too soon Requested Prescriptions  Pending Prescriptions Disp Refills   triamterene -hydrochlorothiazide  (MAXZIDE ) 75-50 MG tablet [Pharmacy Med Name: TRIAMTERENE  75MG / HCTZ 50MG  TABLETS] 90 tablet 1    Sig: TAKE 1 TABLET BY MOUTH DAILY     Cardiovascular: Diuretic Combos Passed - 10/01/2023  3:50 PM      Passed - K in normal range and within 180 days    Potassium  Date Value Ref Range Status  08/27/2023 3.9 3.5 - 5.2 mmol/L Final         Passed - Na in normal range and within 180 days    Sodium  Date Value Ref Range Status  08/27/2023 141 134 - 144 mmol/L Final         Passed - Cr in normal range and within 180 days    Creat  Date Value Ref Range Status  08/09/2017 1.25 0.60 - 1.35 mg/dL Final   Creatinine, Ser  Date Value Ref Range Status  08/27/2023 1.08 0.76 - 1.27 mg/dL Final         Passed - Last BP in normal range    BP Readings from Last 1 Encounters:  08/27/23 104/66         Passed - Valid encounter within last 6 months    Recent Outpatient Visits           1 month ago Type 2 diabetes mellitus with vascular disease (HCC)   [facility] Melvin Pao, NP       Future Appointments             In 2 days Onesimo Oneil LABOR, MD [facility]

## 2023-10-03 ENCOUNTER — Ambulatory Visit: Admitting: Orthopedic Surgery

## 2023-10-04 ENCOUNTER — Telehealth: Payer: Self-pay | Admitting: Orthopedic Surgery

## 2023-10-04 ENCOUNTER — Telehealth: Payer: Self-pay | Admitting: Radiology

## 2023-10-04 NOTE — Telephone Encounter (Signed)
 Patient called and ask if he can get his results over the phone. CB#[phone]

## 2023-10-04 NOTE — Telephone Encounter (Signed)
 error

## 2023-10-07 ENCOUNTER — Encounter: Admitting: Physician Assistant

## 2023-11-26 ENCOUNTER — Encounter: Payer: Self-pay | Admitting: Nurse Practitioner

## 2023-12-23 ENCOUNTER — Encounter: Payer: Self-pay | Admitting: Radiology

## 2023-12-25 LAB — OPHTHALMOLOGY REPORT-SCANNED

## 2024-01-23 ENCOUNTER — Encounter: Payer: Self-pay | Admitting: Nurse Practitioner

## 2024-01-23 ENCOUNTER — Telehealth: Admitting: Nurse Practitioner

## 2024-01-23 DIAGNOSIS — J019 Acute sinusitis, unspecified: Secondary | ICD-10-CM | POA: Diagnosis not present

## 2024-01-23 MED ORDER — AMOXICILLIN-POT CLAVULANATE 875-125 MG PO TABS: 1.0000 | ORAL_TABLET | Freq: Two times a day (BID) | ORAL | 0 refills | Status: DC

## 2024-01-23 NOTE — Progress Notes (Signed)
 There were no vitals taken for this visit.   Subjective:    Patient ID: Devon Macdonald., male    DOB: [birth year], 43 y.o.   MRN: [id number]  HPI: Devon Macdonald. is a 43 y.o. male  Chief Complaint  Patient presents with   URI    Patient states he has been having chest congestion, nasal congestion, and cough (productive). States he has trouble getting the phlegm to come up from his chest.    UPPER RESPIRATORY TRACT INFECTION Worst symptom: symptoms have been going on for over a week Fever: no Cough: yes Shortness of breath: yes Wheezing: yes Chest pain: no Chest tightness: no Chest congestion: yes Nasal congestion: yes Runny nose: yes Post nasal drip: yes Sneezing: no Sore throat: yes Swollen glands: no Sinus pressure: yes Headache: yes Face pain: yes Toothache: no Ear pain: no bilateral Ear pressure: no bilateral Eyes red/itching:no Eye drainage/crusting: no  Vomiting: no Rash: no Fatigue: yes Sick contacts: no Strep contacts: no  Context: stable Recurrent sinusitis: no   Relevant past medical, surgical, family and social history reviewed and updated as indicated. Interim medical history since our last visit reviewed. Allergies and medications reviewed and updated.  Review of Systems  Constitutional:  Positive for fatigue. Negative for fever.  HENT:  Positive for congestion, postnasal drip, rhinorrhea, sinus pressure, sinus pain, sneezing and sore throat. Negative for ear pain.   Respiratory:  Positive for cough, shortness of breath and wheezing. Negative for chest tightness.   Gastrointestinal:  Negative for vomiting.  Skin:  Negative for rash.  Neurological:  Positive for headaches.    Per HPI unless specifically indicated above     Objective:    There were no vitals taken for this visit.  Wt Readings from Last 3 Encounters:  08/27/23 171 lb 6.4 oz (77.7 kg)  07/11/23 174 lb (78.9 kg)  04/01/23 175 lb (79.4 kg)    Physical  Exam Vitals and nursing note reviewed.  Constitutional:      General: He is not in acute distress.    Appearance: He is not ill-appearing.  HENT:     Head: Normocephalic.     Right Ear: Hearing normal.     Left Ear: Hearing normal.     Nose: Nose normal.  Pulmonary:     Effort: Pulmonary effort is normal. No respiratory distress.  Neurological:     Mental Status: He is alert.  Psychiatric:        Mood and Affect: Mood normal.        Behavior: Behavior normal.        Thought Content: Thought content normal.        Judgment: Judgment normal.     Results for orders placed or performed in visit on 12/27/23  OPHTHALMOLOGY REPORT-SCANNED   Collection Time: 12/25/23 10:06 AM  Result Value Ref Range   HM Diabetic Eye Exam No Retinopathy No Retinopathy   A Comment        Assessment & Plan:   Problem List Items Addressed This Visit   None Visit Diagnoses       Acute sinusitis, recurrence not specified, unspecified location    -  Primary   Will treat with Augmentin .  Compelte course of medication.  Okay to use over the counter symptom management.  Follow up if not improved.   Relevant Medications   amoxicillin -clavulanate (AUGMENTIN ) 875-125 MG tablet        Follow up plan: No follow-ups on  file.  This visit was completed via MyChart due to the restrictions of the COVID-19 pandemic. All issues as above were discussed and addressed. Physical exam was done as above through visual confirmation on MyChart. If it was felt that the patient should be evaluated in the office, they were directed there. The patient verbally consented to this visit. Location of the patient: Home Location of the provider: Office Those involved with this call:  Provider: Darice Petty, NP CMA: Laymon Metro, CMA Front Desk/Registration: Claretta Maiden This encounter was conducted via video.  I spent 20 minutes dedicated to the care of this patient on the date of this encounter to include previsit  review of  plan of care, medications, symptoms and follow up, face to face time with the patient, and post visit ordering of testing.

## 2024-02-11 ENCOUNTER — Other Ambulatory Visit: Payer: Self-pay | Admitting: Nurse Practitioner

## 2024-02-11 DIAGNOSIS — E1159 Type 2 diabetes mellitus with other circulatory complications: Secondary | ICD-10-CM

## 2024-02-14 ENCOUNTER — Other Ambulatory Visit (HOSPITAL_COMMUNITY): Payer: Self-pay

## 2024-02-14 ENCOUNTER — Telehealth: Payer: Self-pay | Admitting: Pharmacy Technician

## 2024-02-14 NOTE — Telephone Encounter (Signed)
 Pharmacy Patient Advocate Encounter   Received notification from Onbase that prior authorization for Ozempic  (1 MG/DOSE) 4MG /3ML pen-injectors is required/requested.   Insurance verification completed.   The patient is insured through [facility].   Per test claim: PA required and submitted KEY/EOC/Request #: [facility]

## 2024-02-15 ENCOUNTER — Telehealth: Admitting: Family Medicine

## 2024-02-15 DIAGNOSIS — E1159 Type 2 diabetes mellitus with other circulatory complications: Secondary | ICD-10-CM

## 2024-02-15 DIAGNOSIS — J111 Influenza due to unidentified influenza virus with other respiratory manifestations: Secondary | ICD-10-CM | POA: Diagnosis not present

## 2024-02-15 MED ORDER — PROMETHAZINE-DM 6.25-15 MG/5ML PO SYRP: 5.0000 mL | ORAL_SOLUTION | Freq: Four times a day (QID) | ORAL | 0 refills | Status: AC | PRN

## 2024-02-15 MED ORDER — PREDNISONE 20 MG PO TABS: 20.0000 mg | ORAL_TABLET | Freq: Two times a day (BID) | ORAL | 0 refills | Status: AC

## 2024-02-15 MED ORDER — OSELTAMIVIR PHOSPHATE 75 MG PO CAPS: 75.0000 mg | ORAL_CAPSULE | Freq: Two times a day (BID) | ORAL | 0 refills | Status: AC

## 2024-02-15 NOTE — Patient Instructions (Signed)

## 2024-02-15 NOTE — Progress Notes (Signed)
 " Virtual Visit Consent   Devon K Zeidan Jr., you are scheduled for a virtual visit with a [facility] provider today. Just as with appointments in the office, your consent must be obtained to participate. Your consent will be active for this visit and any virtual visit you may have with one of our providers in the next 365 days. If you have a MyChart account, a copy of this consent can be sent to you electronically.  As this is a virtual visit, video technology does not allow for your provider to perform a traditional examination. This may limit your provider's ability to fully assess your condition. If your provider identifies any concerns that need to be evaluated in person or the need to arrange testing (such as labs, EKG, etc.), we will make arrangements to do so. Although advances in technology are sophisticated, we cannot ensure that it will always work on either your end or our end. If the connection with a video visit is poor, the visit may have to be switched to a telephone visit. With either a video or telephone visit, we are not always able to ensure that we have a secure connection.  By engaging in this virtual visit, you consent to the provision of healthcare and authorize for your insurance to be billed (if applicable) for the services provided during this visit. Depending on your insurance coverage, you may receive a charge related to this service.  I need to obtain your verbal consent now. Are you willing to proceed with your visit today? [name] has provided verbal consent on 02/15/2024 for a virtual visit (video or telephone). Loa Lamp, FNP  Date: 02/15/2024 3:23 PM   Virtual Visit via Video Note   I, Loa Lamp, connected with  Devon Macdonald.  ([id number], [birth year]) on 02/15/2024 at  4:00 PM EST by a video-enabled telemedicine application and verified that I am speaking with the correct person using two identifiers.  Location: Patient: Virtual Visit  Location Patient: Home Provider: Virtual Visit Location Provider: Home Office   I discussed the limitations of evaluation and management by telemedicine and the availability of in person appointments. The patient expressed understanding and agreed to proceed.    History of Present Illness: Devon Macdonald. is a 43 y.o. who identifies as a male who was assigned male at birth, and is being seen today for flu sx starting yesterday, runny nose, cough, had flu shots. Daughters have flu. No wheezing or sob. He is diabetic. He does feel tight like the infection is going into his chest. .  HPI: HPI  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   Elevated LFTs 11/10/2019   Flank pain 10/28/2019   OSA (obstructive sleep apnea) 10/20/2019   Cervical strain, acute 08/25/2019   Snoring 03/30/2019   Cough 06/23/2014   Muscle spasms of neck 06/23/2014   Migraine headache 07/04/2011   Abnormal TSH 07/04/2011   Hyperlipidemia associated with type 2 diabetes mellitus (HCC) 06/13/2007   Hypertension associated with type 2 diabetes mellitus (HCC) 06/13/2007    Allergies: Allergies[1] Medications: Current Medications[2]  Observations/Objective: Patient is well-developed, well-nourished in no acute distress.  Resting comfortably  at home.  Head is normocephalic, atraumatic.  No labored breathing.  Speech is clear and coherent with logical content.  Patient is alert and oriented at  baseline.    Assessment and Plan: 1. Influenza (Primary)  2. Type 2 diabetes mellitus with vascular disease (HCC)  Increase fluids, humidifier at night, stop prednisone  if glucose goes over 250.   Follow Up Instructions: I discussed the assessment and treatment plan with the patient. The patient was provided an opportunity to ask questions and all were answered. The patient agreed  with the plan and demonstrated an understanding of the instructions.  A copy of instructions were sent to the patient via MyChart unless otherwise noted below.     The patient was advised to call back or seek an in-person evaluation if the symptoms worsen or if the condition fails to improve as anticipated.    Lecretia Buczek, FNP     [1]  Allergies Allergen Reactions   Other Rash and Swelling   Bee Venom     swelling   Ciprofloxacin Itching   Doxycycline  Other (See Comments)    Reports groin swelling after taking pill.  Was hospitalized and received IV doxycycline  without reaction.    Sulfa  Antibiotics     Itching swelling of the hands and knees   [2]  Current Outpatient Medications:    oseltamivir  (TAMIFLU ) 75 MG capsule, Take 1 capsule (75 mg total) by mouth 2 (two) times daily for 5 days., Disp: 10 capsule, Rfl: 0   predniSONE  (DELTASONE ) 20 MG tablet, Take 1 tablet (20 mg total) by mouth 2 (two) times daily with a meal for 5 days., Disp: 10 tablet, Rfl: 0   promethazine -dextromethorphan (PROMETHAZINE -DM) 6.25-15 MG/5ML syrup, Take 5 mLs by mouth 4 (four) times daily as needed for up to 10 days for cough., Disp: 118 mL, Rfl: 0   amoxicillin -clavulanate (AUGMENTIN ) 875-125 MG tablet, Take 1 tablet by mouth 2 (two) times daily., Disp: 20 tablet, Rfl: 0   benazepril  (LOTENSIN ) 5 MG tablet, Take 1 tablet (5 mg total) by mouth daily., Disp: 90 tablet, Rfl: 1   cyclobenzaprine  (FLEXERIL ) 10 MG tablet, Take 1 tablet (10 mg total) by mouth 3 (three) times daily as needed for muscle spasms., Disp: 30 tablet, Rfl: 2   empagliflozin  (JARDIANCE ) 10 MG TABS tablet, Take 1 tablet (10 mg total) by mouth daily., Disp: 90 tablet, Rfl: 1   EPINEPHrine  0.3 mg/0.3 mL IJ SOAJ injection, Inject 0.3 mg into the muscle as needed for anaphylaxis., Disp: 1 each, Rfl: 3   fluticasone  (FLONASE ) 50 MCG/ACT nasal spray, SPRAY 2 SPRAYS INTO EACH NOSTRIL EVERY DAY, Disp: 48 mL, Rfl: 1   metFORMIN  (GLUCOPHAGE ) 500 MG  tablet, Take 1 tablet (500 mg total) by mouth daily with breakfast., Disp: 90 tablet, Rfl: 1   montelukast  (SINGULAIR ) 10 MG tablet, TAKE 1 TABLET BY MOUTH EVERYDAY AT BEDTIME, Disp: 90 tablet, Rfl: 1   Multiple Vitamin (MULTIVITAMIN WITH MINERALS) TABS, Take 1 tablet by mouth daily., Disp: , Rfl:    OZEMPIC , 1 MG/DOSE, 4 MG/3ML SOPN, INJECT 1 MG UNDER THE SKIN ONE DAY A WEEK, Disp: 6 mL, Rfl: 1   rosuvastatin  (CRESTOR ) 10 MG tablet, Take 1 tablet (10 mg total) by mouth daily., Disp: 90 tablet, Rfl: 1   triamterene -hydrochlorothiazide  (MAXZIDE ) 75-50 MG tablet, Take 1 tablet by mouth daily., Disp: 90 tablet, Rfl: 1  "

## 2024-03-02 ENCOUNTER — Ambulatory Visit: Admitting: Nurse Practitioner

## 2024-03-02 ENCOUNTER — Encounter: Payer: Self-pay | Admitting: Nurse Practitioner

## 2024-03-02 VITALS — BP 121/81 | HR 93 | Temp 97.7°F | Ht 65.98 in | Wt 165.8 lb

## 2024-03-02 DIAGNOSIS — G4733 Obstructive sleep apnea (adult) (pediatric): Secondary | ICD-10-CM | POA: Diagnosis not present

## 2024-03-02 DIAGNOSIS — F439 Reaction to severe stress, unspecified: Secondary | ICD-10-CM

## 2024-03-02 DIAGNOSIS — E785 Hyperlipidemia, unspecified: Secondary | ICD-10-CM

## 2024-03-02 DIAGNOSIS — E1159 Type 2 diabetes mellitus with other circulatory complications: Secondary | ICD-10-CM | POA: Diagnosis not present

## 2024-03-02 DIAGNOSIS — I152 Hypertension secondary to endocrine disorders: Secondary | ICD-10-CM | POA: Diagnosis not present

## 2024-03-02 DIAGNOSIS — Z0000 Encounter for general adult medical examination without abnormal findings: Secondary | ICD-10-CM | POA: Diagnosis not present

## 2024-03-02 DIAGNOSIS — E1169 Type 2 diabetes mellitus with other specified complication: Secondary | ICD-10-CM

## 2024-03-02 DIAGNOSIS — E119 Type 2 diabetes mellitus without complications: Secondary | ICD-10-CM | POA: Diagnosis not present

## 2024-03-02 DIAGNOSIS — Z7985 Long-term (current) use of injectable non-insulin antidiabetic drugs: Secondary | ICD-10-CM | POA: Diagnosis not present

## 2024-03-02 LAB — MICROALBUMIN, URINE WAIVED
Creatinine, Urine Waived: 300 mg/dL (ref 10–300)
Microalb, Ur Waived: 80 mg/L — ABNORMAL HIGH (ref 0–19)

## 2024-03-02 MED ORDER — OZEMPIC (1 MG/DOSE) 4 MG/3ML ~~LOC~~ SOPN: 1.0000 mg | PEN_INJECTOR | SUBCUTANEOUS | 1 refills | Status: AC

## 2024-03-02 MED ORDER — CITALOPRAM HYDROBROMIDE 10 MG PO TABS: 10.0000 mg | ORAL_TABLET | Freq: Every day | ORAL | 1 refills | Status: AC

## 2024-03-02 MED ORDER — TRIAMTERENE-HCTZ 37.5-25 MG PO TABS: 1.0000 | ORAL_TABLET | Freq: Every day | ORAL | 1 refills | Status: AC

## 2024-03-02 MED ORDER — ROSUVASTATIN CALCIUM 10 MG PO TABS: 10.0000 mg | ORAL_TABLET | Freq: Every day | ORAL | 1 refills | Status: AC

## 2024-03-02 MED ORDER — METFORMIN HCL 500 MG PO TABS: 500.0000 mg | ORAL_TABLET | Freq: Every day | ORAL | 1 refills | Status: AC

## 2024-03-02 MED ORDER — EMPAGLIFLOZIN 10 MG PO TABS: 10.0000 mg | ORAL_TABLET | Freq: Every day | ORAL | 1 refills | Status: AC

## 2024-03-02 MED ORDER — BENAZEPRIL HCL 5 MG PO TABS: 5.0000 mg | ORAL_TABLET | Freq: Every day | ORAL | 1 refills | Status: AC

## 2024-03-02 NOTE — Assessment & Plan Note (Signed)
 Chronic.  Controlled.  Continue with current medication regimen.  Labs ordered today.  Return to clinic in 6 months for reevaluation.  Call sooner if concerns arise.  ? ?

## 2024-03-02 NOTE — Progress Notes (Signed)
 "  BP 121/81 (BP Location: Right Arm, Patient Position: Sitting, Cuff Size: Normal)   Pulse 93   Temp 97.7 F (36.5 C) (Oral)   Ht 5' 5.98 (1.676 m)   Wt 165 lb 12.8 oz (75.2 kg)   SpO2 97%   BMI 26.77 kg/m    Subjective:    Patient ID: Devon MARLA Vicci Mickey., male    DOB: [birth year], 44 y.o.   MRN: [id number]  HPI: Devon Macdonald. is a 44 y.o. male presenting on 03/02/2024 for comprehensive medical examination. Current medical complaints include:none  He currently lives with: Interim Problems from his last visit: no  HYPERTENSION / HYPERLIPIDEMIA Satisfied with current treatment? yes Duration of hypertension: years BP monitoring frequency: not checking BP range:  BP medication side effects: no Past BP meds: Triameterne, HCTZ, and Benzapril Duration of hyperlipidemia: years Cholesterol medication side effects: yes Cholesterol supplements: none Past cholesterol medications: crestor  Medication compliance: excellent compliance Aspirin: no Recent stressors: no Recurrent headaches: no Visual changes: no Palpitations: no Dyspnea: no Chest pain: no Lower extremity edema: no Dizzy/lightheaded: no   DIABETES Patient is taking Metformin , Ozempic , Jardiance . Patient states he is constantly having to go to the bathroom.   Hypoglycemic episodes:no Polydipsia/polyuria: no Visual disturbance: no Chest pain: no Paresthesias: no Glucose Monitoring: no  Accucheck frequency: Not Checking  Fasting glucose:  Post prandial:  Evening:  Before meals: Taking Insulin?: no  Long acting insulin:  Short acting insulin: Blood Pressure Monitoring: not checking Retinal Examination: Up to Date Foot Exam: Up to Date Diabetic Education: Not Completed Pneumovax: Up to Date Influenza: Up to Date Aspirin: no  Patient states he has been more stressed lately.  He has a lot going on at home.  He has been having trouble sleeping and seems to can't shut his mind off.    Depression Screen  done today and results listed below:     03/02/2024    8:13 AM 08/27/2023    8:25 AM 02/22/2023    8:24 AM 12/27/2022    9:38 AM 08/21/2022    8:24 AM  Depression screen PHQ 2/9  Decreased Interest 0 0 0 0 0  Down, Depressed, Hopeless 0 0 0 0 0  PHQ - 2 Score 0 0 0 0 0  Altered sleeping 2  0 0 0  Tired, decreased energy 0  0 0 0  Change in appetite 0  0 0 0  Feeling bad or failure about yourself  0  0 0 0  Trouble concentrating 2  0 0 0  Moving slowly or fidgety/restless 0  0 0 0  Suicidal thoughts 0  0 0 0  PHQ-9 Score 4  0  0  0   Difficult doing work/chores Somewhat difficult   Not difficult at all Not difficult at all     Data saved with a previous flowsheet row definition    The patient does not have a history of falls. I did complete a risk assessment for falls. A plan of care for falls was documented.   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   EPINEPHrine  0.3 mg/0.3 mL IJ SOAJ injection Inject 0.3 mg into the muscle as needed for anaphylaxis.   fluticasone  (FLONASE ) 50 MCG/ACT nasal spray SPRAY 2 SPRAYS INTO EACH NOSTRIL EVERY DAY   montelukast  (SINGULAIR ) 10 MG tablet TAKE 1 TABLET BY MOUTH EVERYDAY AT BEDTIME   Multiple Vitamin (MULTIVITAMIN WITH MINERALS) TABS Take 1 tablet by mouth daily.   No current facility-administered medications on file prior to visit.    Allergies:  Allergies[1]  Social History:  Social History   Socioeconomic History    Marital status: Married    Spouse name: Not on file   Number of children: Not on file   Years of education: Not on file   Highest education level: Bachelor's degree (e.g., BA, AB, BS)  Occupational History   Not on file  Tobacco Use   Smoking status: Never   Smokeless tobacco: Never  Vaping Use   Vaping status: Never Used  Substance and Sexual Activity   Alcohol use: No   Drug use: No   Sexual activity: Yes  Other Topics Concern   Not on file  Social History Narrative   Not on file   Social Drivers of Health   Tobacco Use: Low Risk (03/02/2024)   Patient History    Smoking Tobacco Use: Never    Smokeless Tobacco Use: Never    Passive Exposure: Not on file  Financial Resource Strain: Low Risk (01/22/2024)   Overall Financial Resource Strain (CARDIA)    Difficulty of Paying Living Expenses: Not hard at all  Food Insecurity: No Food Insecurity (01/22/2024)   Epic    Worried About Radiation Protection Practitioner of Food in the Last Year: Never true    Ran Out of Food in the Last Year: Never true  Transportation Needs: No Transportation Needs (01/22/2024)   Epic    Lack of Transportation (Medical): No    Lack of Transportation (Non-Medical): No  Physical Activity: Sufficiently Active (01/22/2024)   Exercise Vital Sign    Days of Exercise per Week: 5 days    Minutes of Exercise per Session: 30 min  Stress: No Stress Concern Present (01/22/2024)   [employer] of Occupational Health - Occupational Stress Questionnaire    Feeling of Stress: Not at all  Social Connections: Socially Integrated (01/22/2024)   Social Connection and Isolation Panel    Frequency of Communication with Friends and Family: More than three times a week    Frequency of Social Gatherings with Friends and Family: More than three times a week    Attends Religious Services: More than 4 times per year    Active Member of Clubs or Organizations: Yes    Attends Banker Meetings: More than 4 times per year     Marital Status: Married  Catering Manager Violence: Not At Risk (08/27/2023)   Epic    Fear of Current or Ex-Partner: No    Emotionally Abused: No    Physically Abused: No    Sexually Abused: No  Depression (PHQ2-9): Low Risk (03/02/2024)   Depression (PHQ2-9)    PHQ-2 Score: 4  Alcohol Screen: Low Risk (08/27/2023)   Alcohol Screen    Last Alcohol Screening Score (AUDIT): 0  Housing: High Risk (01/22/2024)   Epic    Unable to Pay for Housing in  the Last Year: Yes    Number of Times Moved in the Last Year: 0    Homeless in the Last Year: No  Utilities: Not At Risk (08/27/2023)   Epic    Threatened with loss of utilities: No  Health Literacy: Adequate Health Literacy (08/27/2023)   B1300 Health Literacy    Frequency of need for help with medical instructions: Never   Tobacco Use History[2] Social History   Substance and Sexual Activity  Alcohol Use No    Family History:  Family History  Problem Relation Age of Onset   Diabetes Mother    Breast cancer Mother 38       BRCA2 positive; triple negative   Diabetes Father    Prostate cancer Paternal Uncle    Cancer Other        MGM's siblings (breast in MGMs's sister dx >50; prostate in MGM's brother; unknown cancer in MGM's sister)    Past medical history, surgical history, medications, allergies, family history and social history reviewed with patient today and changes made to appropriate areas of the chart.   Review of Systems  HENT:         Denies vision changes.  Eyes:  Negative for blurred vision and double vision.  Respiratory:  Negative for shortness of breath.   Cardiovascular:  Negative for chest pain, palpitations and leg swelling.  Genitourinary:  Positive for frequency.  Neurological:  Negative for dizziness, tingling and headaches.  Endo/Heme/Allergies:  Negative for polydipsia.       Denies Polyuria  Psychiatric/Behavioral:  The patient has insomnia.        Stress   All other ROS negative except what is listed  above and in the HPI.      Objective:    BP 121/81 (BP Location: Right Arm, Patient Position: Sitting, Cuff Size: Normal)   Pulse 93   Temp 97.7 F (36.5 C) (Oral)   Ht 5' 5.98 (1.676 m)   Wt 165 lb 12.8 oz (75.2 kg)   SpO2 97%   BMI 26.77 kg/m   Wt Readings from Last 3 Encounters:  03/02/24 165 lb 12.8 oz (75.2 kg)  08/27/23 171 lb 6.4 oz (77.7 kg)  07/11/23 174 lb (78.9 kg)    Physical Exam Vitals and nursing note reviewed.  Constitutional:      General: He is not in acute distress.    Appearance: Normal appearance. He is not ill-appearing, toxic-appearing or diaphoretic.  HENT:     Head: Normocephalic.     Right Ear: Tympanic membrane, ear canal and external ear normal.     Left Ear: Tympanic membrane, ear canal and external ear normal.     Nose: Nose normal. No congestion or rhinorrhea.     Mouth/Throat:     Mouth: Mucous membranes are moist.  Eyes:     General:        Right eye: No discharge.        Left eye: No discharge.     Extraocular Movements: Extraocular movements intact.     Conjunctiva/sclera: Conjunctivae normal.     Pupils: Pupils are equal, round, and reactive to light.  Cardiovascular:     Rate and Rhythm: Normal rate and regular rhythm.     Heart sounds: No murmur heard. Pulmonary:     Effort: Pulmonary effort is normal. No respiratory distress.     Breath sounds: Normal breath sounds. No wheezing, rhonchi or rales.  Abdominal:     General: Abdomen is flat. Bowel  sounds are normal. There is no distension.     Palpations: Abdomen is soft.     Tenderness: There is no abdominal tenderness. There is no guarding.  Musculoskeletal:     Cervical back: Normal range of motion and neck supple.  Skin:    General: Skin is warm and dry.     Capillary Refill: Capillary refill takes less than 2 seconds.  Neurological:     General: No focal deficit present.     Mental Status: He is alert and oriented to person, place, and time.     Cranial Nerves: No  cranial nerve deficit.     Motor: No weakness.     Deep Tendon Reflexes: Reflexes normal.  Psychiatric:        Mood and Affect: Mood normal.        Behavior: Behavior normal.        Thought Content: Thought content normal.        Judgment: Judgment normal.     Results for orders placed or performed in visit on 12/27/23  OPHTHALMOLOGY REPORT-SCANNED   Collection Time: 12/25/23 10:06 AM  Result Value Ref Range   HM Diabetic Eye Exam No Retinopathy No Retinopathy   A Comment        Assessment & Plan:   Problem List Items Addressed This Visit       Cardiovascular and Mediastinum   Hypertension associated with type 2 diabetes mellitus (HCC)   Chronic.  Controlled.  Continue with current medication regimen of Benzepril (on for kidney protection).  Decreased dose of Maxide due to urinary frequency.  Refills sent today.  Labs ordered today.  Return to clinic in 1 month for reevaluation.  Call sooner if concerns arise.       Relevant Medications   triamterene -hydrochlorothiazide  (MAXZIDE -25) 37.5-25 MG tablet   metFORMIN  (GLUCOPHAGE ) 500 MG tablet   benazepril  (LOTENSIN ) 5 MG tablet   empagliflozin  (JARDIANCE ) 10 MG TABS tablet   Semaglutide , 1 MG/DOSE, (OZEMPIC , 1 MG/DOSE,) 4 MG/3ML SOPN   rosuvastatin  (CRESTOR ) 10 MG tablet     Respiratory   OSA (obstructive sleep apnea)   Chronic.  Controlled.  Continue with current medication regimen.  Labs ordered today.  Return to clinic in 6 months for reevaluation.  Call sooner if concerns arise.          Endocrine   Hyperlipidemia associated with type 2 diabetes mellitus (HCC)   Chronic.  Controlled.  Rosuvastatin  increased to 10mg .   Refills sent today.  Labs ordered today.  Return to clinic in 6 months for reevaluation.  Call sooner if concerns arise.       Relevant Medications   triamterene -hydrochlorothiazide  (MAXZIDE -25) 37.5-25 MG tablet   metFORMIN  (GLUCOPHAGE ) 500 MG tablet   benazepril  (LOTENSIN ) 5 MG tablet    empagliflozin  (JARDIANCE ) 10 MG TABS tablet   Semaglutide , 1 MG/DOSE, (OZEMPIC , 1 MG/DOSE,) 4 MG/3ML SOPN   rosuvastatin  (CRESTOR ) 10 MG tablet   Other Relevant Orders   Lipid panel   Diabetes mellitus treated with injections of non-insulin medication (HCC)   Chronic. Patient doing well with Ozempic .  Has lost 6lbs since last visit.  Continue with Ozempic , Metformin  and Jardiance .  Doing well and tolerating medication well.  He has only been taking Metformin  500mg .  A1c has been 5.6%.   Continue with low carb diet and exercise.  Follow up in 6 months.  Call sooner if concerns arise.       Relevant Medications   metFORMIN  (GLUCOPHAGE ) 500 MG  tablet   benazepril  (LOTENSIN ) 5 MG tablet   empagliflozin  (JARDIANCE ) 10 MG TABS tablet   Semaglutide , 1 MG/DOSE, (OZEMPIC , 1 MG/DOSE,) 4 MG/3ML SOPN   rosuvastatin  (CRESTOR ) 10 MG tablet   Other Relevant Orders   Hemoglobin A1c   Microalbumin, Urine Waived     Other   Stress   Has been more stressed due to him moms recent illness and having to care for her.  Will start Citalopram  10mg  daily.  Side effects and benefits of medication discussed.  Follow up in 1 month.  Call sooner if concerns arise.       Other Visit Diagnoses       Annual physical exam    -  Primary   Health maintenance reviewe dduring visit today.  Labs ordered.  Vaccines reviewed.   Relevant Orders   TSH   Lipid panel   CBC with Differential/Platelet   Comprehensive metabolic panel with GFR        Discussed aspirin prophylaxis for myocardial infarction prevention and decision was it was not indicated  LABORATORY TESTING:  Health maintenance labs ordered today as discussed above.     IMMUNIZATIONS:   - Tdap: Tetanus vaccination status reviewed: last tetanus booster within 10 years. - Influenza: Up to date - Pneumovax: Up to date - Prevnar: Up to date - COVID: Up to date - HPV: Not applicable - Shingrix vaccine: Not applicable  SCREENING: - Colonoscopy: Not  applicable  Discussed with patient purpose of the colonoscopy is to detect colon cancer at curable precancerous or early stages   - AAA Screening: Not applicable  -Hearing Test: Not applicable  -Spirometry: Not applicable   PATIENT COUNSELING:    Sexuality: Discussed sexually transmitted diseases, partner selection, use of condoms, avoidance of unintended pregnancy  and contraceptive alternatives.   Advised to avoid cigarette smoking.  I discussed with the patient that most people either abstain from alcohol or drink within safe limits (<=14/week and <=4 drinks/occasion for males, <=7/weeks and <= 3 drinks/occasion for females) and that the risk for alcohol disorders and other health effects rises proportionally with the number of drinks per week and how often a drinker exceeds daily limits.  Discussed cessation/primary prevention of drug use and availability of treatment for abuse.   Diet: Encouraged to adjust caloric intake to maintain  or achieve ideal body weight, to reduce intake of dietary saturated fat and total fat, to limit sodium intake by avoiding high sodium foods and not adding table salt, and to maintain adequate dietary potassium and calcium  preferably from fresh fruits, vegetables, and low-fat dairy products.    stressed the importance of regular exercise  Injury prevention: Discussed safety belts, safety helmets, smoke detector, smoking near bedding or upholstery.   Dental health: Discussed importance of regular tooth brushing, flossing, and dental visits.   Follow up plan: NEXT PREVENTATIVE PHYSICAL DUE IN 1 YEAR. Return in about 1 month (around 04/02/2024) for BP Check, Depression/Anxiety FU.     [1]  Allergies Allergen Reactions   Other Rash and Swelling   Bee Venom     swelling   Ciprofloxacin Itching   Doxycycline  Other (See Comments)    Reports groin swelling after taking pill.  Was hospitalized and received IV doxycycline  without reaction.    Sulfa   Antibiotics     Itching swelling of the hands and knees   [2]  Social History Tobacco Use  Smoking Status Never  Smokeless Tobacco Never   "

## 2024-03-02 NOTE — Assessment & Plan Note (Signed)
 Has been more stressed due to him moms recent illness and having to care for her.  Will start Citalopram  10mg  daily.  Side effects and benefits of medication discussed.  Follow up in 1 month.  Call sooner if concerns arise.

## 2024-03-02 NOTE — Assessment & Plan Note (Signed)
 Chronic.  Controlled.  Rosuvastatin increased to 10mg .   Refills sent today.  Labs ordered today.  Return to clinic in 6 months for reevaluation.  Call sooner if concerns arise.

## 2024-03-02 NOTE — Assessment & Plan Note (Signed)
 Chronic.  Controlled.  Continue with current medication regimen of Benzepril (on for kidney protection).  Decreased dose of Maxide due to urinary frequency.  Refills sent today.  Labs ordered today.  Return to clinic in 1 month for reevaluation.  Call sooner if concerns arise.

## 2024-03-02 NOTE — Assessment & Plan Note (Addendum)
 Chronic. Patient doing well with Ozempic .  Has lost 6lbs since last visit.  Continue with Ozempic , Metformin  and Jardiance .  Doing well and tolerating medication well.  He has only been taking Metformin  500mg .  A1c has been 5.6%.   Continue with low carb diet and exercise.  Follow up in 6 months.  Call sooner if concerns arise.

## 2024-03-03 ENCOUNTER — Ambulatory Visit: Payer: Self-pay | Admitting: Nurse Practitioner

## 2024-03-03 ENCOUNTER — Telehealth: Payer: Self-pay

## 2024-03-03 ENCOUNTER — Other Ambulatory Visit (HOSPITAL_COMMUNITY): Payer: Self-pay

## 2024-03-03 LAB — COMPREHENSIVE METABOLIC PANEL WITH GFR
ALT: 38 IU/L (ref 0–44)
AST: 21 IU/L (ref 0–40)
Albumin: 4.8 g/dL (ref 4.1–5.1)
Alkaline Phosphatase: 80 IU/L (ref 47–123)
BUN/Creatinine Ratio: 9 (ref 9–20)
BUN: 10 mg/dL (ref 6–24)
Bilirubin Total: 0.7 mg/dL (ref 0.0–1.2)
CO2: 26 mmol/L (ref 20–29)
Calcium: 10.4 mg/dL — ABNORMAL HIGH (ref 8.7–10.2)
Chloride: 92 mmol/L — ABNORMAL LOW (ref 96–106)
Creatinine, Ser: 1.13 mg/dL (ref 0.76–1.27)
Globulin, Total: 2.1 g/dL (ref 1.5–4.5)
Glucose: 84 mg/dL (ref 70–99)
Potassium: 3.6 mmol/L (ref 3.5–5.2)
Sodium: 139 mmol/L (ref 134–144)
Total Protein: 6.9 g/dL (ref 6.0–8.5)
eGFR: 83 mL/min/1.73

## 2024-03-03 LAB — LIPID PANEL
Chol/HDL Ratio: 2.4 ratio (ref 0.0–5.0)
Cholesterol, Total: 122 mg/dL (ref 100–199)
HDL: 51 mg/dL
LDL Chol Calc (NIH): 52 mg/dL (ref 0–99)
Triglycerides: 101 mg/dL (ref 0–149)
VLDL Cholesterol Cal: 19 mg/dL (ref 5–40)

## 2024-03-03 LAB — CBC WITH DIFFERENTIAL/PLATELET
Basophils Absolute: 0 x10E3/uL (ref 0.0–0.2)
Basos: 1 %
EOS (ABSOLUTE): 0.1 x10E3/uL (ref 0.0–0.4)
Eos: 1 %
Hematocrit: 51.3 % — ABNORMAL HIGH (ref 37.5–51.0)
Hemoglobin: 16.4 g/dL (ref 13.0–17.7)
Immature Grans (Abs): 0 x10E3/uL (ref 0.0–0.1)
Immature Granulocytes: 0 %
Lymphocytes Absolute: 2.9 x10E3/uL (ref 0.7–3.1)
Lymphs: 47 %
MCH: 27.9 pg (ref 26.6–33.0)
MCHC: 32 g/dL (ref 31.5–35.7)
MCV: 87 fL (ref 79–97)
Monocytes Absolute: 0.6 x10E3/uL (ref 0.1–0.9)
Monocytes: 9 %
Neutrophils Absolute: 2.6 x10E3/uL (ref 1.4–7.0)
Neutrophils: 42 %
Platelets: 351 x10E3/uL (ref 150–450)
RBC: 5.88 x10E6/uL — ABNORMAL HIGH (ref 4.14–5.80)
RDW: 13.4 % (ref 11.6–15.4)
WBC: 6.1 x10E3/uL (ref 3.4–10.8)

## 2024-03-03 LAB — HEMOGLOBIN A1C
Est. average glucose Bld gHb Est-mCnc: 123 mg/dL
Hgb A1c MFr Bld: 5.9 % — ABNORMAL HIGH (ref 4.8–5.6)

## 2024-03-03 LAB — TSH: TSH: 3.46 u[IU]/mL (ref 0.450–4.500)

## 2024-03-03 NOTE — Telephone Encounter (Signed)
 Pharmacy Patient Advocate Encounter   Received notification from [facility] KEY that prior authorization for Ozempic  4 is required/requested.   Insurance verification completed.   The patient is insured through [employer].   Per test claim: PA required; PA started via CoverMyMeds. KEY [id number] . Waiting for clinical questions to populate.

## 2024-03-10 ENCOUNTER — Other Ambulatory Visit (HOSPITAL_COMMUNITY): Payer: Self-pay

## 2024-03-20 ENCOUNTER — Encounter: Payer: Self-pay | Admitting: Nurse Practitioner

## 2024-03-27 ENCOUNTER — Other Ambulatory Visit: Payer: Self-pay | Admitting: Nurse Practitioner

## 2024-03-27 DIAGNOSIS — E1159 Type 2 diabetes mellitus with other circulatory complications: Secondary | ICD-10-CM

## 2024-04-06 ENCOUNTER — Ambulatory Visit: Admitting: Nurse Practitioner
